# Patient Record
Sex: Male | Born: 1970
Health system: Southern US, Community
[De-identification: ages and names within clinical notes are randomized; demographics above are authoritative.]

## PROBLEM LIST (undated history)

## (undated) DIAGNOSIS — M545 Low back pain, unspecified: Secondary | ICD-10-CM

## (undated) DIAGNOSIS — B019 Varicella without complication: Secondary | ICD-10-CM

## (undated) DIAGNOSIS — Z9289 Personal history of other medical treatment: Secondary | ICD-10-CM

## (undated) DIAGNOSIS — E119 Type 2 diabetes mellitus without complications: Secondary | ICD-10-CM

## (undated) DIAGNOSIS — F32A Depression, unspecified: Secondary | ICD-10-CM

## (undated) DIAGNOSIS — F419 Anxiety disorder, unspecified: Secondary | ICD-10-CM

## (undated) DIAGNOSIS — J45909 Unspecified asthma, uncomplicated: Secondary | ICD-10-CM

## (undated) DIAGNOSIS — J449 Chronic obstructive pulmonary disease, unspecified: Secondary | ICD-10-CM

## (undated) DIAGNOSIS — M1990 Unspecified osteoarthritis, unspecified site: Secondary | ICD-10-CM

## (undated) DIAGNOSIS — F329 Major depressive disorder, single episode, unspecified: Secondary | ICD-10-CM

## (undated) HISTORY — DX: Major depressive disorder, single episode, unspecified: F32.9

## (undated) HISTORY — PX: BACK SURGERY: SHX140

## (undated) HISTORY — DX: Low back pain, unspecified: M54.50

## (undated) HISTORY — DX: Personal history of other medical treatment: Z92.89

## (undated) HISTORY — DX: Depression, unspecified: F32.A

## (undated) HISTORY — DX: Type 2 diabetes mellitus without complications: E11.9

## (undated) HISTORY — DX: Low back pain: M54.5

## (undated) HISTORY — DX: Varicella without complication: B01.9

## (undated) HISTORY — DX: Unspecified osteoarthritis, unspecified site: M19.90

## (undated) HISTORY — DX: Anxiety disorder, unspecified: F41.9

---

## 2000-04-02 HISTORY — PX: APPENDECTOMY: SHX54

## 2006-05-31 ENCOUNTER — Emergency Department: Payer: Self-pay | Admitting: Emergency Medicine

## 2006-05-31 ENCOUNTER — Other Ambulatory Visit: Payer: Self-pay

## 2008-12-13 ENCOUNTER — Inpatient Hospital Stay: Payer: Self-pay | Admitting: Surgery

## 2010-04-02 HISTORY — PX: CHOLECYSTECTOMY: SHX55

## 2011-04-24 IMAGING — US ABDOMEN ULTRASOUND
1 series · 17 of 25 positions shown · non-contrast
Comparison: none

REASON FOR EXAM: LUCIO LARA
COMMENTS:

[Series 1: abdomen ultrasound · 17 of 72 slices shown]
[im 1/72]
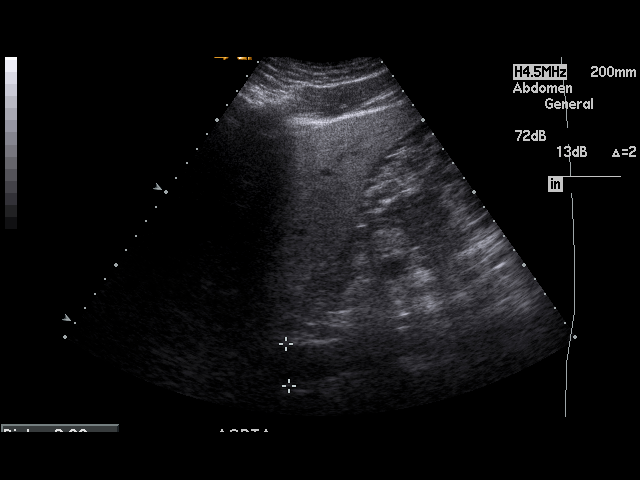
[im 6/72]
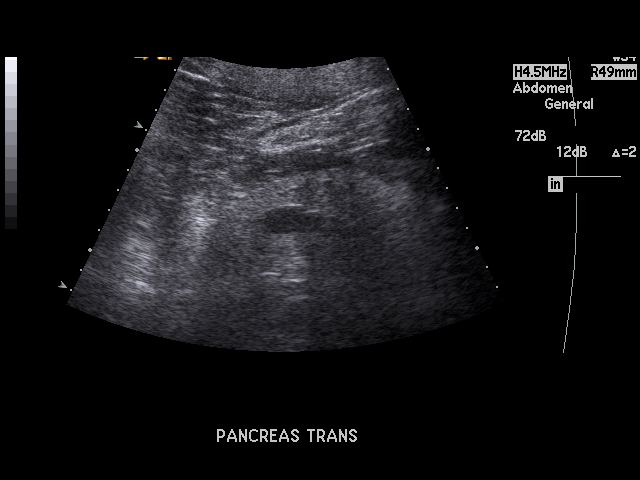
[im 9/72]
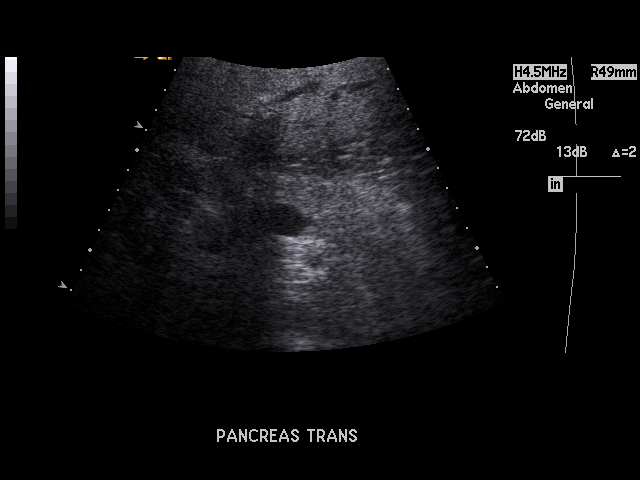
[im 15/72]
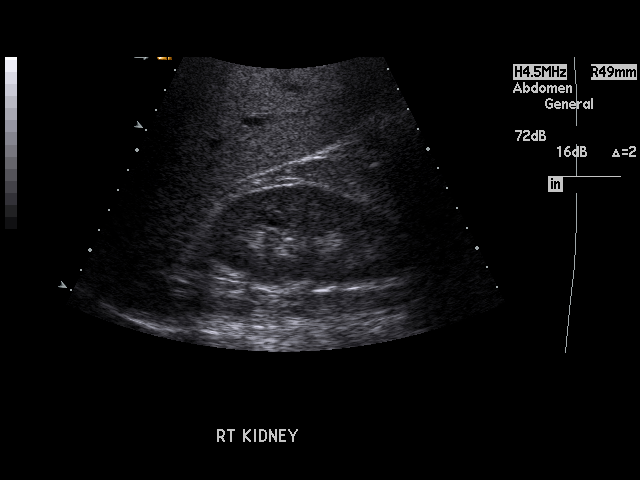
[im 18/72]
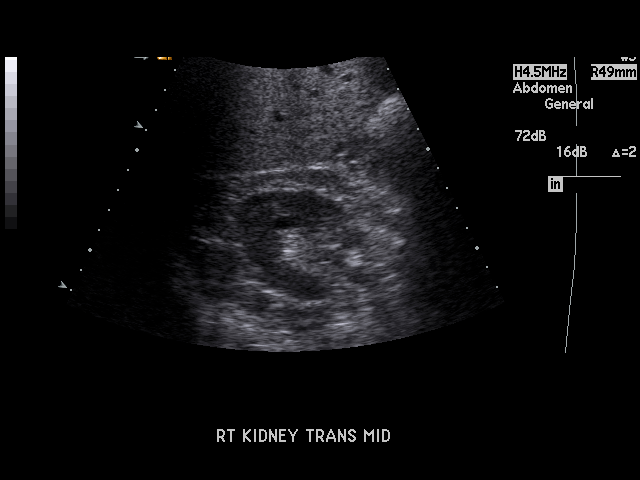
[im 24/72]
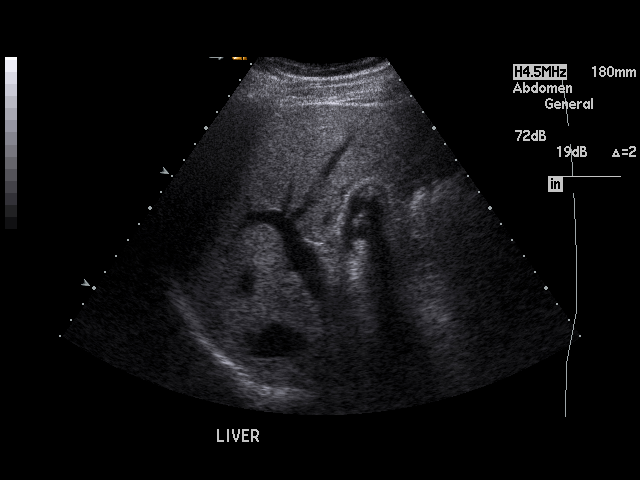
[im 27/72]
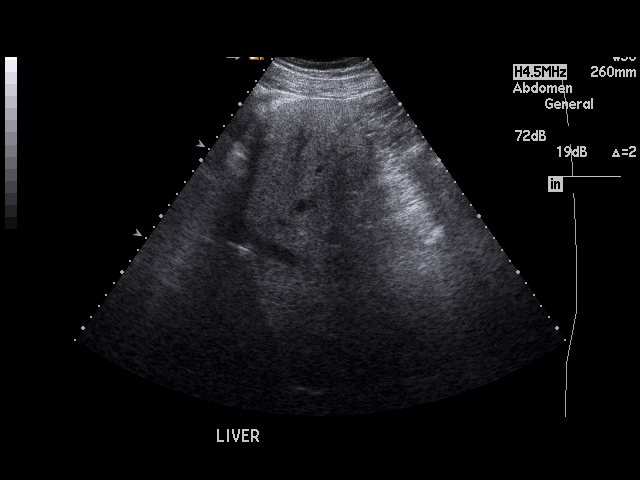
[im 33/72]
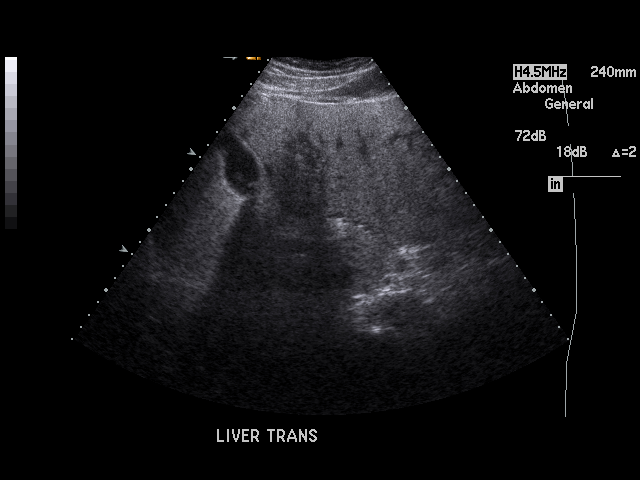
[im 36/72]
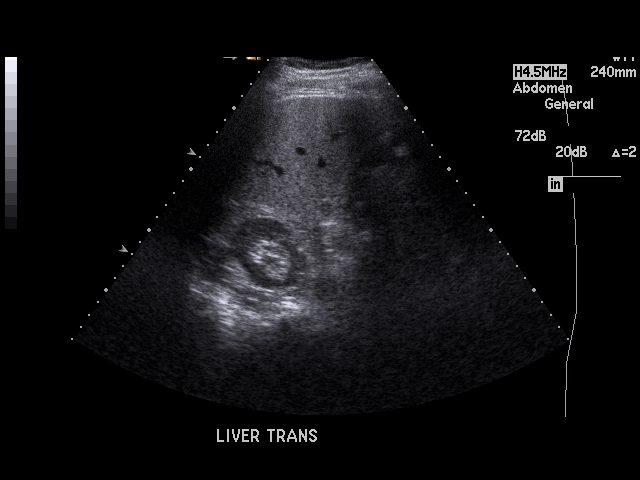
[im 39/72]
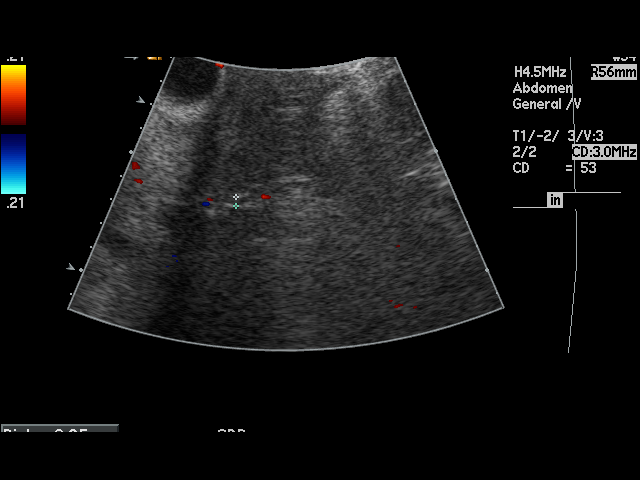
[im 45/72]
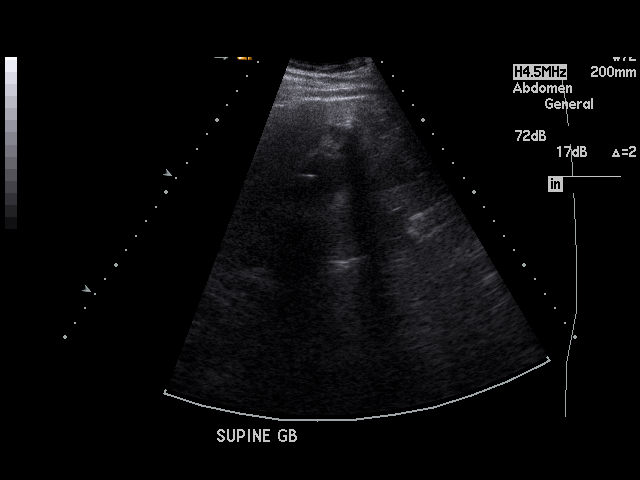
[im 48/72]
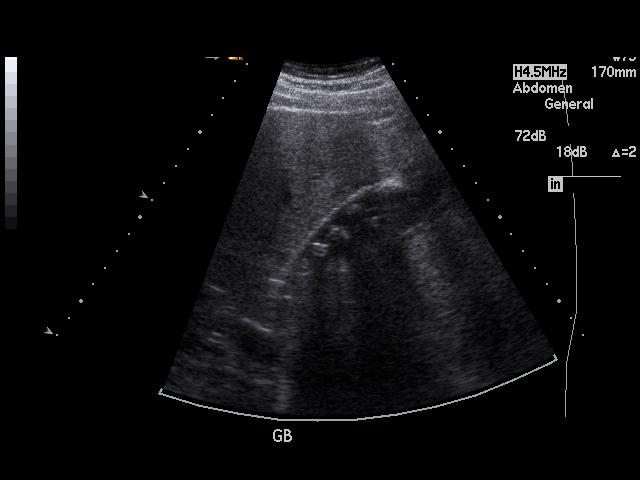
[im 54/72]
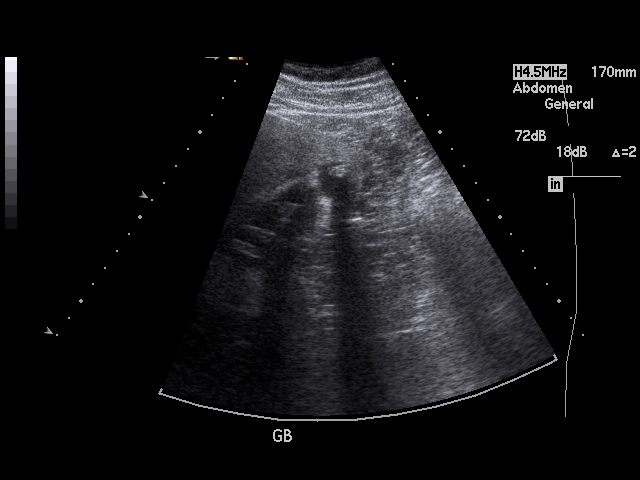
[im 57/72]
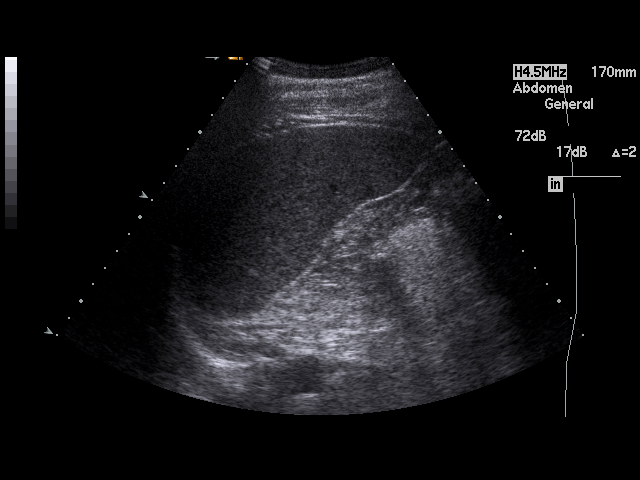
[im 63/72]
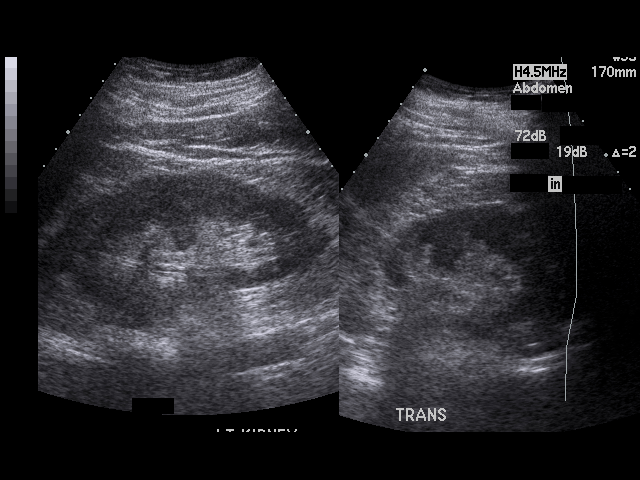
[im 66/72]
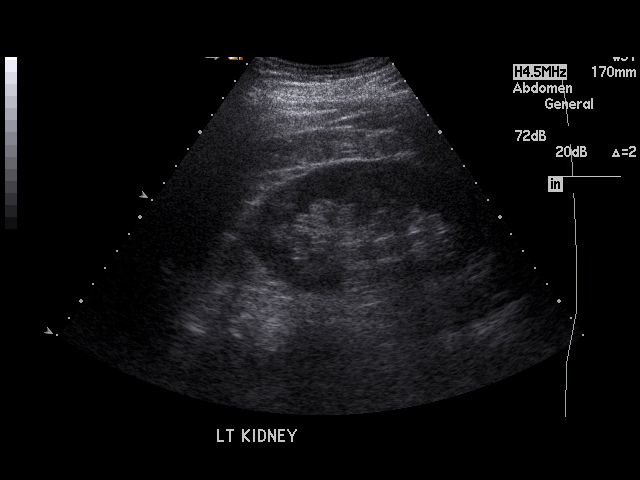
[im 72/72]
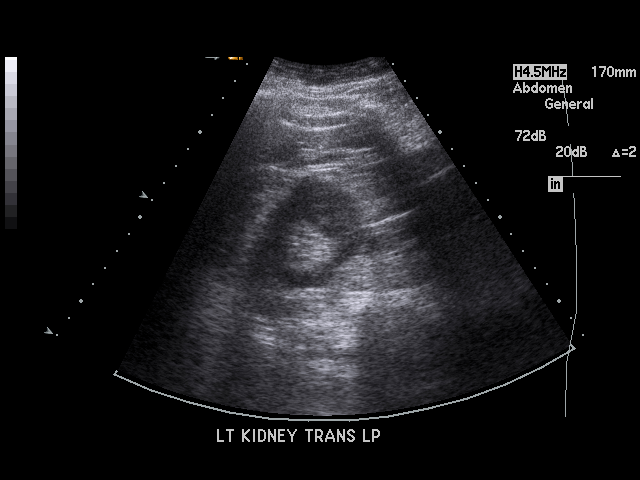

[17 of 25 positions shown; findings below may reference images not displayed]

PROCEDURE:     US  - US ABDOMEN GENERAL SURVEY  - December 14, 2008  [DATE]

RESULT:     The liver exhibits mildly increased echotexture consistent with
fatty infiltration. There is no focal mass nor ductal dilation. The
gallbladder contains multiple echogenic mobile shadowing stones. There is no
sonographic Murphy's sign. The gallbladder wall remains normal at 2.4 mm.
There is no pericholecystic fluid. The common bile duct measures
millimeters in diameter.

The pancreas, abdominal aorta, and kidneys exhibit no acute abnormality.
There is mild enlargement of the spleen at 13.4 cm.
IMPRESSION: 1. There are multiple gallstones present.
2. There are fatty infiltrative changes of the liver.
3. There is mild splenomegaly.
4. The visualized portions of the abdominal aorta are normal in caliber.

## 2013-04-16 DIAGNOSIS — F3289 Other specified depressive episodes: Secondary | ICD-10-CM | POA: Diagnosis not present

## 2013-04-16 DIAGNOSIS — F329 Major depressive disorder, single episode, unspecified: Secondary | ICD-10-CM | POA: Diagnosis not present

## 2015-02-20 ENCOUNTER — Emergency Department
Admission: EM | Admit: 2015-02-20 | Discharge: 2015-02-21 | Disposition: A | Discharge status: Home / Self Care | Payer: Medicare Other | Attending: Emergency Medicine | Admitting: Emergency Medicine

## 2015-02-20 ENCOUNTER — Encounter: Payer: Self-pay | Admitting: Emergency Medicine

## 2015-02-20 DIAGNOSIS — Z87891 Personal history of nicotine dependence: Secondary | ICD-10-CM | POA: Insufficient documentation

## 2015-02-20 DIAGNOSIS — R454 Irritability and anger: Secondary | ICD-10-CM | POA: Insufficient documentation

## 2015-02-20 DIAGNOSIS — E11319 Type 2 diabetes mellitus with unspecified diabetic retinopathy without macular edema: Secondary | ICD-10-CM

## 2015-02-20 DIAGNOSIS — E1165 Type 2 diabetes mellitus with hyperglycemia: Secondary | ICD-10-CM | POA: Insufficient documentation

## 2015-02-20 DIAGNOSIS — R739 Hyperglycemia, unspecified: Secondary | ICD-10-CM

## 2015-02-20 HISTORY — DX: Chronic obstructive pulmonary disease, unspecified: J44.9

## 2015-02-20 HISTORY — DX: Unspecified asthma, uncomplicated: J45.909

## 2015-02-20 LAB — GLUCOSE, CAPILLARY: Glucose-Capillary: 311 mg/dL — ABNORMAL HIGH (ref 65–99)

## 2015-02-20 NOTE — ED Notes (Signed)
Family states 1.5 weeks ago pt started complaining of blurred vision, and was more irritable. Family states pt has been sleeping more. Family states that she took his blood sugar at home and noted it to be 468. Pt has never been diagnosed with diabetes.

## 2015-02-20 NOTE — ED Provider Notes (Signed)
[facility] Emergency Department Provider Note  ____________________________________________  Time seen: Approximately 11:47 PM  I have reviewed the triage vital signs and the nursing notes.   HISTORY  Chief Complaint Hyperglycemia    HPI Brad Jimenez. is a 44 y.o. male who presents to the ED from home with a chief complaint of increased urinary frequency, polydipsia, polyuria, fatigue, irritability and blurry vision. Reports symptoms for 2 weeks. Patient does not have a history of diabetes although it does run in his family. Spouse checked a 15 hour fasting blood sugar yesterday afternoon which was in the 200s. After he ate a meal of [employer] chicken biscuit and [facility], they rechecked his blood sugar which was over 500. Patient does not have a primary care doctor and presents to the ED for further evaluation and treatment of hyperglycemia. Denies recent fever, chills, chest pain, shortness of breath, cough, abdominal pain, nausea, vomiting, diarrhea. Complains of generalized weakness. Spouse states he is more irritable. Nothing makes his symptoms better or worse.  Past Medical History  Diagnosis Date  . COPD (chronic obstructive pulmonary disease) ([place])   . Asthma     There are no active problems to display for this patient.   Past Surgical History  Procedure Laterality Date  . Cholecystectomy    . Back surgery      No current outpatient prescriptions on file.  Allergies Fentanyl  History reviewed. No pertinent family history.  Social History Social History  Substance Use Topics  . Smoking status: Former Smoker    Quit date: 02/20/2011  . Smokeless tobacco: None  . Alcohol Use: Yes     Comment: occasional    Review of Systems Constitutional: Positive for polydipsia. Positive for generalized weakness. No fever/chills Eyes: Positive for blurry vision. ENT: No sore throat. Cardiovascular: Denies chest pain. Respiratory: Denies  shortness of breath. Gastrointestinal: No abdominal pain.  No nausea, no vomiting.  No diarrhea.  No constipation. Genitourinary: Positive for polyuria. Negative for dysuria. Musculoskeletal: Negative for back pain. Skin: Negative for rash. Neurological: Negative for headaches, focal weakness or numbness. Psychiatric:Positive for irritability. Negative for depression, SI/HI/AH/VH. Endocrine:Positive for elevated blood sugars.  10-point ROS otherwise negative.  ____________________________________________   PHYSICAL EXAM:  VITAL SIGNS: ED Triage Vitals  Enc Vitals Group     BP 02/20/15 2337 144/91 mmHg     Pulse Rate 02/20/15 2337 83     Resp 02/20/15 2337 18     Temp 02/20/15 2337 98.7 F (37.1 C)     Temp Source 02/20/15 2337 Oral     SpO2 02/20/15 2337 95 %     Weight 02/20/15 2337 230 lb (104.327 kg)     Height 02/20/15 2337 5\' 7"  (1.702 m)     Head Cir --      Peak Flow --      Pain Score --      Pain Loc --      Pain Edu? --      Excl. in [place]? --     Constitutional: Alert and oriented. Well appearing and in no acute distress. Eyes: Conjunctivae are normal. PERRL. EOMI. Funduscopy grossly within normal limits. Head: Atraumatic. Nose: No congestion/rhinnorhea. Mouth/Throat: Mucous membranes are moist.  Oropharynx non-erythematous. Neck: No stridor.  No carotid bruits. Hematological/Lymphatic/Immunilogical: No cervical lymphadenopathy. Cardiovascular: Normal rate, regular rhythm. Grossly normal heart sounds.  Good peripheral circulation. Respiratory: Normal respiratory effort.  No retractions. Lungs CTAB. Gastrointestinal: Soft and nontender. No distention. No abdominal bruits.  No CVA tenderness. Musculoskeletal: No lower extremity tenderness nor edema.  No joint effusions. Neurologic:  Normal speech and language. No gross focal neurologic deficits are appreciated. No gait instability. Skin:  Skin is warm, dry and intact. No rash noted. Psychiatric: Mood and affect  are normal. Speech and behavior are normal.  ____________________________________________   LABS (all labs ordered are listed, but only abnormal results are displayed)  Labs Reviewed  GLUCOSE, CAPILLARY - Abnormal; Notable for the following:    Glucose-Capillary 311 (*)    All other components within normal limits  CBC WITH DIFFERENTIAL/PLATELET  BASIC METABOLIC PANEL  URINALYSIS COMPLETEWITH MICROSCOPIC ([facility] ONLY)  HEMOGLOBIN A1C   ____________________________________________  EKG  None ____________________________________________  RADIOLOGY  None ____________________________________________   PROCEDURES  Procedure(s) performed: None  Critical Care performed: No  ____________________________________________   INITIAL IMPRESSION / ASSESSMENT AND PLAN / ED COURSE  Pertinent labs & imaging results that were available during my care of the patient were reviewed by me and considered in my medical decision making (see chart for details).  44 year old male who presents with symptoms consistent with new diagnosis of adult onset type 2 diabetes. Nonfasting Accu-Chek is 311. Will check screening labs including electrolytes, hemoglobin A1c; initiate IV fluid resuscitation.  ----------------------------------------- 12:40 AM on 02/21/2015 -----------------------------------------  Updated patient and spouse of laboratory and return results. Patient is not in DKA. Will initiate treatment with metformin, refer for outpatient ophthalmology follow-up as well as primary care establishment. Strict return precautions given. Both verbalize understanding and agree with plan of care. 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

## 2015-02-20 NOTE — ED Notes (Signed)
Pt presents to ED with c/o increased urinary frequency, fatigue, increased thirst, and blurry vision x 1.5 weeks.  Family states that she took his blood sugar at home and it was 468. Pt states he has not been diagnosed with diabetes. Pt denies chest pain, abdominal pain, or shortness of breath. Pt alert and oriented x 4, no increased work in breathing, skin warm and dry. Call bell within reach. Family at bedside. MD at bedside.

## 2015-02-21 LAB — CBC WITH DIFFERENTIAL/PLATELET
BASOS PCT: 1 %
Basophils Absolute: 0.1 10*3/uL (ref 0–0.1)
EOS ABS: 0.1 10*3/uL (ref 0–0.7)
Eosinophils Relative: 2 %
HCT: 43 % (ref 40.0–52.0)
HEMOGLOBIN: 15.3 g/dL (ref 13.0–18.0)
LYMPHS ABS: 3.7 10*3/uL — AB (ref 1.0–3.6)
Lymphocytes Relative: 39 %
MCH: 30.7 pg (ref 26.0–34.0)
MCHC: 35.5 g/dL (ref 32.0–36.0)
MCV: 86.5 fL (ref 80.0–100.0)
Monocytes Absolute: 0.5 10*3/uL (ref 0.2–1.0)
Monocytes Relative: 6 %
NEUTROS PCT: 52 %
Neutro Abs: 5 10*3/uL (ref 1.4–6.5)
Platelets: 289 10*3/uL (ref 150–440)
RBC: 4.97 MIL/uL (ref 4.40–5.90)
RDW: 13 % (ref 11.5–14.5)
WBC: 9.4 10*3/uL (ref 3.8–10.6)

## 2015-02-21 LAB — URINALYSIS COMPLETE WITH MICROSCOPIC (ARMC ONLY)
BILIRUBIN URINE: NEGATIVE
Bacteria, UA: NONE SEEN
LEUKOCYTES UA: NEGATIVE
NITRITE: NEGATIVE
PH: 6 (ref 5.0–8.0)
Protein, ur: NEGATIVE mg/dL
RBC / HPF: NONE SEEN RBC/hpf (ref 0–5)
Specific Gravity, Urine: 1.025 (ref 1.005–1.030)
Squamous Epithelial / LPF: NONE SEEN

## 2015-02-21 LAB — BASIC METABOLIC PANEL
Anion gap: 11 (ref 5–15)
BUN: 15 mg/dL (ref 6–20)
CHLORIDE: 94 mmol/L — AB (ref 101–111)
CO2: 24 mmol/L (ref 22–32)
Calcium: 9.1 mg/dL (ref 8.9–10.3)
Creatinine, Ser: 1.12 mg/dL (ref 0.61–1.24)
GFR calc non Af Amer: >60 mL/min (ref >60)
Glucose, Bld: 313 mg/dL — ABNORMAL HIGH (ref 65–99)
POTASSIUM: 3.4 mmol/L — AB (ref 3.5–5.1)
SODIUM: 129 mmol/L — AB (ref 135–145)

## 2015-02-21 LAB — HEMOGLOBIN A1C: Hgb A1c MFr Bld: 10 % — ABNORMAL HIGH (ref 4.0–6.0)

## 2015-02-21 MED ORDER — METFORMIN HCL 500 MG PO TABS: 500.0000 mg | ORAL_TABLET | Freq: Two times a day (BID) | ORAL | Status: DC

## 2015-02-21 MED ORDER — SODIUM CHLORIDE 0.9 % IV BOLUS (SEPSIS)
1000.0000 mL | Freq: Once | INTRAVENOUS | Status: AC
  Administered 2015-02-21: 1000 mL via INTRAVENOUS

## 2015-02-21 NOTE — ED Notes (Signed)

## 2015-02-21 NOTE — Discharge Instructions (Signed)
1. Start metformin 500 mg twice daily (#60). 2. Please call the numbers provided to establish primary care follow-up as well as ophthalmology follow-up. 3. Return to the ER for worsening symptoms, persistent vomiting, difficulty breathing or other concerns.  Hyperglycemia High blood sugar (hyperglycemia) means that the level of sugar in your blood is higher than it should be. Signs of high blood sugar include:  Feeling thirsty.  Frequent peeing (urinating).  Feeling tired or sleepy.  Dry mouth.  Vision changes.  Feeling weak.  Feeling hungry but losing weight.  Numbness and tingling in your hands or feet.  Headache. When you ignore these signs, your blood sugar may keep going up. These problems may get worse, and other problems may begin. HOME CARE  Check your blood sugars as told by your doctor. Write down the numbers with the date and time.  Take the right amount of insulin or diabetes pills at the right time. Write down the dose with date and time.  Refill your insulin or diabetes pills before running out.  Watch what you eat. Follow your meal plan.  Drink liquids without sugar, such as water. Check with your doctor if you have kidney or heart disease.  Follow your doctor's orders for exercise. Exercise at the same time of day.  Keep your doctor's appointments. GET HELP RIGHT AWAY IF:   You have trouble thinking or are confused.  You have fast breathing with fruity smelling breath.  You pass out (faint).  You have 2 to 3 days of high blood sugars and you do not know why.  You have chest pain.  You are feeling sick to your stomach (nauseous) or throwing up (vomiting).  You have sudden vision changes. MAKE SURE YOU:   Understand these instructions.  Will watch your condition.  Will get help right away if you are not doing well or get worse.   This information is not intended to replace advice given to you by your health care provider. Make sure you  discuss any questions you have with your health care provider.   Document Released: 01/14/2009 Document Revised: 04/09/2014 Document Reviewed: 11/23/2014 Elsevier Interactive Patient Education 2016 Elsevier Inc[employer]  Type 2 Diabetes Mellitus, Adult Type 2 diabetes mellitus is a long-term (chronic) disease. In type 2 diabetes:  The pancreas does not make enough of a hormone called insulin.  The cells in the body do not respond as well to the insulin that is made.  Both of the above can happen. Normally, insulin moves sugars from food into tissue cells. This gives you energy. If you have type 2 diabetes, sugars cannot be moved into tissue cells. This causes high blood sugar (hyperglycemia).  Your doctors will set personal treatment goals for you based on your age, your medicines, how long you have had diabetes, and any other medical conditions you have. Generally, the goal of treatment is to maintain the following blood glucose levels:  Before meals (preprandial): 80-130 mg/dL.  After meals (postprandial): below 180 mg/dL.  A1c: less than 6.5-7%. HOME CARE  Have your hemoglobin A1c level checked twice a year. The level shows if your diabetes is under control or out of control.  Test your blood sugar level every day as told by your doctor.  Check your ketone levels by testing your pee (urine) when you are sick and as told.  Take your diabetes or insulin medicine as told by your doctor.  Never run out of insulin.  Adjust how much insulin you give  yourself based on how many carbs (carbohydrates) you eat. Carbs are in many foods, such as fruits, vegetables, whole grains, and dairy products.  Have a healthy snack between every healthy meal. Have 3 meals and 3 snacks a day.  Lose weight if you are overweight.  Carry a medical alert card or wear your medical alert jewelry.  Carry a 15-gram carb snack with you at all times. Examples include:  Glucose pills, 3 or 4.  Glucose gel,  15-gram tube.  Raisins, 2 tablespoons (24 grams).  Jelly beans, 6.  Animal crackers, 8.  Regular (not diet) pop, 4 ounces (120 milliliters).  Gummy treats, 9.  Notice low blood sugar (hypoglycemia) symptoms, such as:  Shaking (tremors).  Trouble thinking clearly.  Sweating.  Faster heart rate.  Headache.  Dry mouth.  Hunger.  Crabbiness (irritability).  Being worried or tense (anxious).  Restless sleep.  A change in speech or coordination.  Confusion.  Treat low blood sugar right away. If you are alert and can swallow, follow the 15:15 rule:  Take 15-20 grams of a rapid-acting glucose or carb. This includes glucose gel, glucose pills, or 4 ounces (120 milliliters) of fruit juice, regular pop, or low-fat milk.  Check your blood sugar level 15 minutes after taking the glucose.  Take 15-20 grams more of glucose if the repeat blood sugar level is still 70 mg/dL (milligrams/deciliter) or below.  Eat a meal or snack within 1 hour of the blood sugar levels going back to normal.  Notice early symptoms of high blood sugar, such as:  Being really thirsty or drinking a lot (polydipsia).  Peeing a lot (polyuria).  Do at least 150 minutes of physical activity a week or as told.  Split the 150 minutes of activity up during the week. Do not do 150 minutes of activity in one day.  Perform exercises, such as weight lifting, at least 2 times a week or as told.  Spend no more than 90 minutes at one time inactive.  Adjust your insulin or food intake as needed if you start a new exercise or sport.  Follow your sick-day plan when you are not able to eat or drink as usual.  Do not smoke, chew tobacco, or use electronic cigarettes.  Women who are not pregnant should drink no more than 1 drink a day. Men should drink no more than 2 drinks a day.  Only drink alcohol with food.  Ask your doctor if alcohol is safe for you.  Tell your doctor if you drink alcohol several  times during the week.  See your doctor regularly.  Schedule an eye exam soon after you are told you have diabetes. Schedule exams once every year.  Check your skin and feet every day. Check for cuts, bruises, redness, nail problems, bleeding, blisters, or sores. A doctor should do a foot exam once a year.  Brush your teeth and gums twice a day. Floss once a day. Visit your dentist regularly.  Share your diabetes plan with your workplace or school.  Keep your shots that fight diseases (vaccines) up to date.  Get a flu (influenza) shot every year.  Get a pneumonia shot. If you are 14 years of age or older and you have never gotten a pneumonia shot, you might need to get two shots.  Ask your doctor which other shots you should get.  Learn how to deal with stress.  Get diabetes education and support as needed.  Ask your doctor for special  help if:  You need help to maintain or improve how you do things on your own.  You need help to maintain or improve the quality of your life.  You have foot or hand problems.  You have trouble cleaning yourself, dressing, eating, or doing physical activity. GET HELP IF:  You are unable to eat or drink for more than 6 hours.  You feel sick to your stomach (nauseous) or throw up (vomit) for more than 6 hours.  Your blood sugar level is over 240 mg/dL.  There is a change in mental status.  You get another serious illness.  You have watery poop (diarrhea) for more than 6 hours.  You have been sick or have had a fever for 2 or more days and are not getting better.  You have pain when you are active. GET HELP RIGHT AWAY IF:  You have trouble breathing.  Your ketone levels are higher than your doctor says they should be. MAKE SURE YOU:  Understand these instructions.  Will watch your condition.  Will get help right away if you are not doing well or get worse.   This information is not intended to replace advice given to you by  your health care provider. Make sure you discuss any questions you have with your health care provider.   Document Released: 12/27/2007 Document Revised: 08/03/2014 Document Reviewed: 10/19/2011 Elsevier Interactive Patient Education [employer].

## 2015-03-03 DIAGNOSIS — E119 Type 2 diabetes mellitus without complications: Secondary | ICD-10-CM | POA: Diagnosis not present

## 2015-03-21 ENCOUNTER — Ambulatory Visit (INDEPENDENT_AMBULATORY_CARE_PROVIDER_SITE_OTHER): Payer: Medicare Other | Admitting: Family Medicine

## 2015-03-21 VITALS — BP 118/74 | HR 78 | Temp 98.0°F | Ht 68.0 in | Wt 226.4 lb

## 2015-03-21 DIAGNOSIS — IMO0001 Reserved for inherently not codable concepts without codable children: Secondary | ICD-10-CM

## 2015-03-21 DIAGNOSIS — F418 Other specified anxiety disorders: Secondary | ICD-10-CM | POA: Diagnosis not present

## 2015-03-21 DIAGNOSIS — E119 Type 2 diabetes mellitus without complications: Secondary | ICD-10-CM | POA: Diagnosis not present

## 2015-03-21 DIAGNOSIS — E1165 Type 2 diabetes mellitus with hyperglycemia: Secondary | ICD-10-CM

## 2015-03-21 DIAGNOSIS — F419 Anxiety disorder, unspecified: Secondary | ICD-10-CM

## 2015-03-21 DIAGNOSIS — G8929 Other chronic pain: Secondary | ICD-10-CM

## 2015-03-21 DIAGNOSIS — M545 Low back pain, unspecified: Secondary | ICD-10-CM

## 2015-03-21 DIAGNOSIS — F32A Depression, unspecified: Secondary | ICD-10-CM

## 2015-03-21 DIAGNOSIS — F329 Major depressive disorder, single episode, unspecified: Secondary | ICD-10-CM

## 2015-03-21 LAB — MICROALBUMIN / CREATININE URINE RATIO
Creatinine,U: 308.4 mg/dL
MICROALB UR: 2.7 mg/dL — AB (ref 0.0–1.9)
Microalb Creat Ratio: 0.9 mg/g (ref 0.0–30.0)

## 2015-03-21 MED ORDER — GLUCOSE BLOOD VI STRP: ORAL_STRIP | Status: DC

## 2015-03-21 MED ORDER — RELION LANCETS STANDARD 21G MISC: 1.0000 | Freq: Two times a day (BID) | Status: DC

## 2015-03-21 MED ORDER — RELION CONFIRM GLUCOSE MONITOR W/DEVICE KIT: 1.0000 | PACK | Freq: Two times a day (BID) | Status: DC

## 2015-03-21 NOTE — Progress Notes (Signed)
Pre visit review using our clinic review tool, if applicable. No additional management support is needed unless otherwise documented below in the visit note. 

## 2015-03-21 NOTE — Patient Instructions (Signed)
Nice to meet you. We will send her to diabetic education. If you don't hear anything regarding this next week please let us know. Please check your blood sugar fasting in the morning and before dinner. If it starts to increase greater than 150 please let us know. Please monitor your vision if this does not improve please let us know. Develop numbness, weakness, bowel or bladder incontinence, numbness between her legs, thoughts of harming herself or others, or any irritated in symptoms please seek medical attention.

## 2015-03-22 ENCOUNTER — Encounter: Payer: Self-pay | Admitting: Family Medicine

## 2015-03-22 DIAGNOSIS — G8929 Other chronic pain: Secondary | ICD-10-CM | POA: Insufficient documentation

## 2015-03-22 DIAGNOSIS — F419 Anxiety disorder, unspecified: Secondary | ICD-10-CM | POA: Insufficient documentation

## 2015-03-22 DIAGNOSIS — M545 Low back pain, unspecified: Secondary | ICD-10-CM | POA: Insufficient documentation

## 2015-03-22 DIAGNOSIS — F329 Major depressive disorder, single episode, unspecified: Secondary | ICD-10-CM | POA: Insufficient documentation

## 2015-03-22 DIAGNOSIS — F32A Depression, unspecified: Secondary | ICD-10-CM | POA: Insufficient documentation

## 2015-03-22 NOTE — Assessment & Plan Note (Signed)
Followed by psychiatry for this. Currently on Zoloft. This issue is stable. No SI or HI. He will continue to follow with psychiatry.

## 2015-03-22 NOTE — Assessment & Plan Note (Signed)
Patient chronic low back pain status post multiple surgeries on his back. His discomfort is stable from this. He does report some numbness intermittently in the back of his legs that is stable as well. His lower extremities are neurologically intact. No red flags. He will continue to monitor this. He is given return precautions.

## 2015-03-22 NOTE — Assessment & Plan Note (Signed)
Patient presents in follow-up from his recent ED visit where he was diagnosed diabetes. A1c that time was 10. He was started on metformin, though he reports some vision issues with this that have resolved. He is now currently on no medications for the last week. Per his glucose log he has sugars range from 89-147 and this would represent remarkable control given he is not on medicine and he had an A1c of 10. He has significantly changed his diet and this could have had some improvement in blood sugars. We will send in a new meter for him. He will return for fasting lipid panel and fasting glucose to determine how true these glucose readings are from home. Given these blood sugar readings I'm hesitant to put him on a sulfonylurea at this time as this could potentially drop his glucose to hypoglycemic levels. He was advised to continue to monitor his blood sugar. If it starts to rise up above 150-200 he'll let us know. He was reminded of symptoms to give [place] a call about.

## 2015-03-22 NOTE — Progress Notes (Signed)
Patient ID: Brad Riches., male   DOB: 19-Mar-1971, 44 y.o.   MRN: [id number]  Brad Rumps, MD Phone: [phone]  Brad Selner. is a 44 y.o. male who presents today for new patient visit.  DIABETES Disease Monitoring: Blood Sugar ranges-in the past week have ranged from 89-147 Polyuria/phagia/dipsia- no      Visual problems- does note some issues seeing up close, notes this is much improved as his blood sugars have decreased, has seen ophthalmology and states he was advised there were no diabetic effects Medications: Compliance- discontinued metformin on his own Hypoglycemic symptoms- no Patient presents today for first patient visit. Notes he was seen in the emergency room several weeks ago when he was noted to have excessive thirst, polyuria, and blurry vision. Prior to going to the ED he had checked his blood sugar on several occasions with his mother's old meter and noted it had ranged from 300-500. He had an A1c in the ED that was 10. He was not in DKA. He was started on metformin at that time. He notes after starting on the metformin his vision started to get worse. His blood sugar did come down. He notes he got to the point where he couldn't see up close or far away. He stopped metformin and his distance vision improved. It is now back to baseline. He notes he has significantly changed his diet. He stopped drinking sodas and candy. He was drinking 12 cans of regular [employer] a day. Has cut out most bread. He feels significantly improved with these dietary changes. He has not noticed any difference since coming off the metformin.  Chronic back issues: Patient notes he had back surgery in 2001 when he broke a bone in his spine. Notes he had to have resurgery when the hardware became loose. Notes since that time he is intermittently had numbness in the back of his legs that occurs depending on what position he is sitting in. He denies weakness, saddle anesthesia, bowel and bladder  incontinence, fever, and history of cancer. He notes this issue is stable and unchanged over a number of years.  Anxiety/depression: Patient notes he is followed by psychiatrist since 2003. He sees a psychiatrist every 3 months. He feels these issues are very well-controlled on his current dose of Zoloft. He denies SI and HI. He denies significant symptoms of anxiety or depression at this time.   Active Ambulatory Problems    Diagnosis Date Noted  . Diabetes type 2, uncontrolled ([place]) 03/21/2015  . Chronic low back pain 03/22/2015  . Anxiety and depression 03/22/2015   Resolved Ambulatory Problems    Diagnosis Date Noted  . No Resolved Ambulatory Problems   Past Medical History  Diagnosis Date  . COPD (chronic obstructive pulmonary disease) ([place])   . Asthma   . Arthritis   . Low back pain   . Chickenpox   . Diabetes ([place])   . Depression   . Anxiety   . History of blood transfusion     Family History  Problem Relation Age of Onset  . Alcoholism Father   . Arthritis Mother   . Lung cancer Mother   . Heart disease Mother   . Stroke Father   . Hypertension Mother   . Diabetes Mother     Social History   Social History  . Marital Status: Married    Spouse Name: N/A  . Number of Children: N/A  . Years of Education: N/A   Occupational  History  . Not on file.   Social History Main Topics  . Smoking status: Former Smoker    Quit date: 02/20/2011  . Smokeless tobacco: Not on file  . Alcohol Use: 0.0 oz/week    0 Standard drinks or equivalent per week     Comment: occasional  . Drug Use: No  . Sexual Activity: Not on file   Other Topics Concern  . Not on file   Social History Narrative    ROS   General:  Negative for unexplained weight loss, fever Skin: Negative for new or changing mole, sore that won't heal HEENT: Positive for trouble seeing Negative for trouble hearin, ringing in ears, mouth sores, hoarseness, change in voice, dysphagia. CV:  Negative for  chest pain, dyspnea, edema, palpitations Resp: Negative for cough, dyspnea, hemoptysis GI: Negative for nausea, vomiting, diarrhea, constipation, abdominal pain, melena, hematochezia. GU: Negative for dysuria, incontinence, urinary hesitance, hematuria, vaginal or penile discharge, polyuria, sexual difficulty, lumps in testicle or breasts MSK: Positive for muscle cramps and aches, Negative for joint pain or swelling Neuro: Positive for numbness, Negative for headaches, weakness, dizziness, passing out/fainting Psych: Positive for anxiety, Negative for depression, memory problems Endocrine: Positive for excessive thirst, negative for frequent urination  Objective  Physical Exam Filed Vitals:   03/21/15 1353  BP: 118/74  Pulse: 78  Temp: 98 F (36.7 C)   Physical Exam  Constitutional: He is well-developed, well-nourished, and in no distress.  HENT:  Head: Normocephalic and atraumatic.  Right Ear: External ear normal.  Left Ear: External ear normal.  Mouth/Throat: Oropharynx is clear and moist. No oropharyngeal exudate.  Eyes: Conjunctivae are normal. Pupils are equal, round, and reactive to light.  Neck: Neck supple.  Cardiovascular: Normal rate, regular rhythm and normal heart sounds.  Exam reveals no gallop and no friction rub.   No murmur heard. Pulmonary/Chest: Effort normal and breath sounds normal. No respiratory distress. He has no wheezes. He has no rales.  Abdominal: Soft. Bowel sounds are normal. He exhibits no distension. There is no tenderness. There is no rebound and no guarding.  Musculoskeletal:  Midline scar noted in lumbar spine, no midline tenderness, no muscular tenderness, no swelling, no erythema  Lymphadenopathy:    He has no cervical adenopathy.  Neurological: He is alert. Gait normal.  5 out of 5 strength bilateral quads, hamstrings, plantar flexion, and dorsiflexion, sensation to light touch intact in bilateral lower extremities, 2+ patellar reflexes    Skin: Skin is warm and dry. He is not diaphoretic.  Psychiatric: Mood and affect normal.   normal monofilament test, no ulcerations or skin changes on the feet   Assessment/Plan:   Diabetes type 2, uncontrolled ([place]) Patient presents in follow-up from his recent ED visit where he was diagnosed diabetes. A1c that time was 10. He was started on metformin, though he reports some vision issues with this that have resolved. He is now currently on no medications for the last week. Per his glucose log he has sugars range from 89-147 and this would represent remarkable control given he is not on medicine and he had an A1c of 10. He has significantly changed his diet and this could have had some improvement in blood sugars. We will send in a new meter for him. He will return for fasting lipid panel and fasting glucose to determine how true these glucose readings are from home. Given these blood sugar readings I'm hesitant to put him on a sulfonylurea at this time as  this could potentially drop his glucose to hypoglycemic levels. He was advised to continue to monitor his blood sugar. If it starts to rise up above 150-200 he'll let us know. He was reminded of symptoms to give [place] a call about.  Chronic low back pain Patient chronic low back pain status post multiple surgeries on his back. His discomfort is stable from this. He does report some numbness intermittently in the back of his legs that is stable as well. His lower extremities are neurologically intact. No red flags. He will continue to monitor this. He is given return precautions.  Anxiety and depression Followed by psychiatry for this. Currently on Zoloft. This issue is stable. No SI or HI. He will continue to follow with psychiatry.    Orders Placed This Encounter  Procedures  . Basic Metabolic Panel (BMET)    Standing Status: Future     Number of Occurrences:      Standing Expiration Date: 03/20/2016  . Urine Microalbumin w/creat. ratio   . Lipid Profile    Standing Status: Future     Number of Occurrences:      Standing Expiration Date: 03/20/2016  . Ambulatory referral to diabetic education    Referral Priority:  Routine    Referral Type:  Consultation    Referral Reason:  Specialty Services Required    Number of Visits Requested:  1    Meds ordered this encounter  Medications  . RELION LANCETS STANDARD 21G MISC    Sig: 1 Device by Does not apply route 2 (two) times daily.    Dispense:  200 each    Refill:  2  . Blood Glucose Monitoring Suppl (RELION CONFIRM GLUCOSE MONITOR) W/DEVICE KIT    Sig: 1 Device by Does not apply route 2 (two) times daily.    Dispense:  1 kit    Refill:  0  . glucose blood (RELION CONFIRM/MICRO TEST) test strip    Sig: Use as instructed    Dispense:  100 each    Refill:  12    Dragon voice recognition software was used during the dictation process of this note. If any phrases or words seem inappropriate it is likely secondary to the translation process being inefficient.  Brad Rumps

## 2015-03-23 ENCOUNTER — Encounter: Payer: Self-pay | Admitting: Family Medicine

## 2015-04-07 ENCOUNTER — Encounter: Payer: Self-pay | Admitting: *Deleted

## 2015-04-07 ENCOUNTER — Encounter: Payer: Medicare Other | Attending: Family Medicine | Admitting: *Deleted

## 2015-04-07 VITALS — BP 118/72 | Ht 68.0 in | Wt 223.5 lb

## 2015-04-07 DIAGNOSIS — E119 Type 2 diabetes mellitus without complications: Secondary | ICD-10-CM | POA: Insufficient documentation

## 2015-04-07 NOTE — Progress Notes (Signed)
Diabetes Self-Management Education  Visit Type: First/Initial  Appt. Start Time: 1320 Appt. End Time: 1430  04/07/2015  [name], identified by name and date of birth, is a 45 y.o. male with a diagnosis of Diabetes: Type 2.   ASSESSMENT  Blood pressure 118/72, height 5\' 8"  (1.727 m), weight 223 lb 8 oz (101.379 kg). Body mass index is 33.99 kg/(m^2).      Diabetes Self-Management Education - 04/07/15 1505    Visit Information   Visit Type First/Initial   Initial Visit   Diabetes Type Type 2   Are you currently following a meal plan? Yes   What type of meal plan do you follow? no surgary sodas, portion control, reading food labels   Are you taking your medications as prescribed? Yes   Date Diagnosed 02/20/15   Health Coping   How would you rate your overall health? Good   Psychosocial Assessment   Patient Belief/Attitude about Diabetes Motivated to manage diabetes   Self-care barriers None   Self-management support Doctor's office;Family   Other persons present Spouse/SO   Patient Concerns Nutrition/Meal planning;Problem Solving   Special Needs None   Preferred Learning Style Hands on   Learning Readiness Change in progress   How often do you need to have someone help you when you read instructions, pamphlets, or other written materials from your doctor or pharmacy? 1 - Never   What is the last grade level you completed in school? [id number]   Complications   Last HgB A1C per patient/outside source 10 %  02/20/15   How often do you check your blood sugar? 1-2 times/day   Fasting Blood glucose range (mg/dL) 70-129  Pt reports FBG's 110-120's mg/dL.   Have you had a dilated eye exam in the past 12 months? Yes   Have you had a dental exam in the past 12 months? Yes   Are you checking your feet? Yes   How many days per week are you checking your feet? 4   Dietary Intake   Breakfast sausage and 1 piece of bread or sausage biscuit or peanut butter crackers   Lunch grilled  chicken with potatoes and beans or hotdog or hamburger with fries   Snack (afternoon) small pizza   Dinner chicken with baked potatoes and pintos; spaghetti, fish with tator tots   Beverage(s) water, unsweetened tea   Exercise   Exercise Type ADL's   Patient Education   Previous Diabetes Education No   Disease state  Definition of diabetes, type 1 and 2, and the diagnosis of diabetes;Factors that contribute to the development of diabetes   Nutrition management  Role of diet in the treatment of diabetes and the relationship between the three main macronutrients and blood glucose level;Carbohydrate counting;Food label reading, portion sizes and measuring food.   Physical activity and exercise  Role of exercise on diabetes management, blood pressure control and cardiac health.   Monitoring Purpose and frequency of SMBG.;Identified appropriate SMBG and/or A1C goals.   Chronic complications Relationship between chronic complications and blood glucose control   Psychosocial adjustment Identified and addressed patients feelings and concerns about diabetes   Individualized Goals (developed by patient)   Reducing Risk Improve blood sugars Prevent diabetes complications   Outcomes   Expected Outcomes Demonstrated interest in learning. Expect positive outcomes      Individualized Plan for Diabetes Self-Management Training:   Learning Objective:  Patient will have a greater understanding of diabetes self-management. Patient education plan is to attend  individual and/or group sessions per assessed needs and concerns.   Plan:   Patient Instructions  Check blood sugars 2 x day before breakfast and 2 hrs after supper every day Exercise: Begin walking for    15  minutes   3  days a week and gradually increase to 150 minutes/week Eat 3 meals day,  1-2  snacks a day Space meals 4-6 hours apart Don't skip meals Bring blood sugar records to the next class   Expected Outcomes:  Demonstrated interest  in learning. Expect positive outcomes  Education material provided:  General Meal Planning Guidelines Simple Meal Plan  If problems or questions, patient to contact team via:   Johny Drilling, [place], [place], CDE [phone]  Future DSME appointment:  May 02, 2015 for Class 1

## 2015-04-07 NOTE — Patient Instructions (Addendum)
Check blood sugars 2 x day before breakfast and 2 hrs after supper every day Exercise: Begin walking for    15  minutes   3  days a week and gradually increase to 150 minutes/week Eat 3 meals day,  1-2  snacks a day Space meals 4-6 hours apart Don't skip meals Bring blood sugar records to the next class

## 2015-04-11 ENCOUNTER — Ambulatory Visit: Payer: Medicare Other | Admitting: Family Medicine

## 2015-04-13 ENCOUNTER — Encounter: Payer: Self-pay | Admitting: Family Medicine

## 2015-04-13 ENCOUNTER — Ambulatory Visit (INDEPENDENT_AMBULATORY_CARE_PROVIDER_SITE_OTHER): Payer: Medicare Other | Admitting: Family Medicine

## 2015-04-13 VITALS — BP 116/72 | HR 78 | Temp 97.4°F | Resp 14 | Ht 68.0 in | Wt 222.2 lb

## 2015-04-13 DIAGNOSIS — IMO0001 Reserved for inherently not codable concepts without codable children: Secondary | ICD-10-CM

## 2015-04-13 DIAGNOSIS — R079 Chest pain, unspecified: Secondary | ICD-10-CM

## 2015-04-13 DIAGNOSIS — F419 Anxiety disorder, unspecified: Secondary | ICD-10-CM

## 2015-04-13 DIAGNOSIS — M546 Pain in thoracic spine: Secondary | ICD-10-CM | POA: Diagnosis not present

## 2015-04-13 DIAGNOSIS — E1165 Type 2 diabetes mellitus with hyperglycemia: Secondary | ICD-10-CM

## 2015-04-13 DIAGNOSIS — F418 Other specified anxiety disorders: Secondary | ICD-10-CM

## 2015-04-13 DIAGNOSIS — F329 Major depressive disorder, single episode, unspecified: Secondary | ICD-10-CM

## 2015-04-13 DIAGNOSIS — F32A Depression, unspecified: Secondary | ICD-10-CM

## 2015-04-13 MED ORDER — SERTRALINE HCL 50 MG PO TABS: 50.0000 mg | ORAL_TABLET | Freq: Every day | ORAL | Status: DC

## 2015-04-13 NOTE — Patient Instructions (Signed)
Nice to see you. Please continue your diet changes for your diabetes. Please continue to follow the diabetic educator. Please monitor for recurrence of your back pain. If it recurs or he develop chest pain, shortness of breath, sweating, numbness, weakness, fever, or any new or change in symptoms please seek medical attention. If you develop thoughts of harming herself or others please seek medical attention.

## 2015-04-13 NOTE — Assessment & Plan Note (Addendum)
Patient's bilateral thoracic back pain that sharp in nature that radiated through to his chest. This is resolved. This is atypical for cardiac cause of pain. EKG is reassuring and unchanged from previous. No history of PE or risk factors for PE. No reflux. Doubt vascular cause given equal blood pressures and pulses bilaterally and stable vital signs. Doubt reflux given lack of symptoms. Does have a history of back issues in the past. Suspect likely related to musculoskeletal cause. Discussed obtaining x-ray of his chest versus back versus referral for evaluation of his chest pain, though the patient declined these opting to continue to monitor. He is given return precautions.

## 2015-04-13 NOTE — Progress Notes (Signed)
Pre visit review using our clinic review tool, if applicable. No additional management support is needed unless otherwise documented below in the visit note. 

## 2015-04-13 NOTE — Assessment & Plan Note (Signed)
Followed by psychiatry. Currently on Zoloft. Needs a refill to cover until he gets in to see the psychiatrist. No SI or HI. Given return precautions.

## 2015-04-13 NOTE — Assessment & Plan Note (Signed)
Glucose appears to be in the normal range at home. We'll check a BMP today. He'll continue his dietary changes.

## 2015-04-13 NOTE — Progress Notes (Signed)
Patient ID: Brad Riches., male   DOB: 04-04-1970, 45 y.o.   MRN: [id number]  Brad Rumps, MD Phone: [phone]  Brad Collinsworth. is a 45 y.o. male who presents today for follow-up.  DIABETES Disease Monitoring: Blood Sugar ranges-91-148 Polyuria/phagia/dipsia- no      Diet controlled. He has cut out [facility] and can't be completely. His decreases carbohydrate intake.  Back pain: Patient notes 3 weeks ago developed sharp stabbing discomfort between her shoulder blades. Notes he can feel this through to his chest. It is a dull pain in his chest. He had no shortness of breath or diaphoresis with this. Notes it got better for got up and moved around. Improved some with Aleve. No history of VTE. No recent surgeries or long trips. No leg swelling. No recent history of reflux. Notes it went on for about 2 weeks intermittently and went away this past Sunday. He does report a history of back pain and back surgeries in the past. No numbness or weakness with it.  Anxiety/depression: Patient notes for the past year or so he has been on Zoloft 50 mg daily. He is yet to follow-up with his psychiatrist. He notes he feels this is well controlled on Zoloft and he needs a refill. No SI or HI.   PMH: Former smoker.   ROS see history of present illness  Objective  Physical Exam Filed Vitals:   04/13/15 0858  BP: 116/72  Pulse: 78  Temp: 97.4 F (36.3 C)  Resp: 14    BP Readings from Last 3 Encounters:  04/13/15 116/72  04/07/15 118/72  03/21/15 118/74   Wt Readings from Last 3 Encounters:  04/13/15 222 lb 3.2 oz (100.789 kg)  04/07/15 223 lb 8 oz (101.379 kg)  03/21/15 226 lb 6.4 oz (102.694 kg)   Left arm blood pressure 110/72 Right arm blood pressure 116/74  Physical Exam  Constitutional: He is well-developed, well-nourished, and in no distress.  HENT:  Head: Normocephalic and atraumatic.  Right Ear: External ear normal.  Left Ear: External ear normal.  Mouth/Throat:  Oropharynx is clear and moist. No oropharyngeal exudate.  Eyes: Conjunctivae are normal. Pupils are equal, round, and reactive to light.  Cardiovascular: Normal rate, regular rhythm and normal heart sounds.  Exam reveals no gallop and no friction rub.   No murmur heard. 2+ radial pulses  Pulmonary/Chest: Effort normal and breath sounds normal. No respiratory distress. He has no wheezes. He has no rales.  Musculoskeletal:  No midline back tenderness or step-off, no midline neck tenderness or step-off, no muscular back tenderness, no chest tenderness  Neurological: He is alert. Gait normal.  5/5 strength in bilateral biceps, triceps, grip, quads, hamstrings, plantar and dorsiflexion, sensation to light touch intact in bilateral UE and LE, normal gait, 2+ patellar reflexes  Skin: Skin is warm and dry. He is not diaphoretic.  Psychiatric: Mood and affect normal.   EKG: Normal sinus rhythm, rate 70, right bundle branch block, T-wave inversion in lead 3, no other ST or T-wave changes  Assessment/Plan: Please see individual problem list.  Diabetes type 2, uncontrolled ([facility]) Glucose appears to be in the normal range at home. We'll check a BMP today. He'll continue his dietary changes.  Anxiety and depression Followed by psychiatry. Currently on Zoloft. Needs a refill to cover until he gets in to see the psychiatrist. No SI or HI. Given return precautions.  Thoracic back pain Patient's bilateral thoracic back pain that sharp in nature that  radiated through to his chest. This is resolved. This is atypical for cardiac cause of pain. EKG is reassuring and unchanged from previous. No history of PE or risk factors for PE. No reflux. Doubt vascular cause given equal blood pressures and pulses bilaterally and stable vital signs. Doubt reflux given lack of symptoms. Does have a history of back issues in the past. Suspect likely related to musculoskeletal cause. Discussed obtaining x-ray of his chest versus  back versus referral for evaluation of his chest pain, though the patient declined these opting to continue to monitor. He is given return precautions.    Orders Placed This Encounter  Procedures  . EKG 12-Lead    Meds ordered this encounter  Medications  . sertraline (ZOLOFT) 50 MG tablet    Sig: Take 1 tablet (50 mg total) by mouth daily.    Dispense:  90 tablet    Refill:  0     Dragon voice recognition software was used during the dictation process of this note. If any phrases or words seem inappropriate it is likely secondary to the translation process being inefficient.  Brad Rumps

## 2015-04-26 ENCOUNTER — Other Ambulatory Visit: Payer: Medicare Other

## 2015-04-27 ENCOUNTER — Other Ambulatory Visit (INDEPENDENT_AMBULATORY_CARE_PROVIDER_SITE_OTHER): Payer: Medicare Other

## 2015-04-27 DIAGNOSIS — E119 Type 2 diabetes mellitus without complications: Secondary | ICD-10-CM | POA: Diagnosis not present

## 2015-04-27 LAB — LIPID PANEL
CHOL/HDL RATIO: 5
Cholesterol: 179 mg/dL (ref 0–200)
HDL: 32.6 mg/dL — AB (ref >39.00)
LDL CALC: 107 mg/dL — AB (ref 0–99)
NONHDL: 146.37
Triglycerides: 196 mg/dL — ABNORMAL HIGH (ref 0.0–149.0)
VLDL: 39.2 mg/dL (ref 0.0–40.0)

## 2015-04-27 LAB — BASIC METABOLIC PANEL
BUN: 17 mg/dL (ref 6–23)
CO2: 26 meq/L (ref 19–32)
Calcium: 9.1 mg/dL (ref 8.4–10.5)
Chloride: 105 mEq/L (ref 96–112)
Creatinine, Ser: 0.73 mg/dL (ref 0.40–1.50)
GFR: 123.14 mL/min (ref >60.00)
GLUCOSE: 109 mg/dL — AB (ref 70–99)
POTASSIUM: 4 meq/L (ref 3.5–5.1)
SODIUM: 137 meq/L (ref 135–145)

## 2015-04-29 ENCOUNTER — Telehealth: Payer: Self-pay | Admitting: Family Medicine

## 2015-04-29 MED ORDER — SERTRALINE HCL 50 MG PO TABS: 50.0000 mg | ORAL_TABLET | Freq: Every day | ORAL | Status: DC

## 2015-04-29 MED ORDER — ATORVASTATIN CALCIUM 40 MG PO TABS: 40.0000 mg | ORAL_TABLET | Freq: Every day | ORAL | Status: DC

## 2015-04-29 NOTE — Telephone Encounter (Signed)
Called and spoke with patient regarding lab work. Advised that he would benefit from being on a cholesterol medicine. Lipitor sent to his pharmacy. He also noted he needed a refill on his Zoloft. I refilled this for him. We will see him back as scheduled in February and check liver function tests at that time.

## 2015-05-02 ENCOUNTER — Encounter: Payer: Medicare Other | Admitting: Dietician

## 2015-05-02 ENCOUNTER — Encounter: Payer: Self-pay | Admitting: Dietician

## 2015-05-02 VITALS — Ht 68.0 in | Wt 224.9 lb

## 2015-05-02 DIAGNOSIS — E119 Type 2 diabetes mellitus without complications: Secondary | ICD-10-CM | POA: Diagnosis not present

## 2015-05-02 NOTE — Progress Notes (Signed)

## 2015-05-09 ENCOUNTER — Encounter: Payer: Medicare Other | Attending: Family Medicine

## 2015-05-09 VITALS — Wt 225.2 lb

## 2015-05-09 DIAGNOSIS — E119 Type 2 diabetes mellitus without complications: Secondary | ICD-10-CM | POA: Insufficient documentation

## 2015-05-10 NOTE — Progress Notes (Signed)
Appt. Start Time: 1730 Appt. End Time: 2030  Pt c/o sore throat and cough-no fever  Class 2 Diabetes Overview - define DM; state own type of DM; identify functions of pancreas and insulin; define insulin deficiency vs insulin resistance  Psychosocial - identify DM as a source of stress; state the effects of stress on BG control; verbalize appropriate stress management techniques; identify personal stress issues   Nutritional Management - describe effects of food on blood glucose; identify sources of carbohydrate, protein and fat; verbalize the importance of balance meals in controlling blood glucose; identify meals as well balanced or not; estimate servings of carbohydrate from menus; use food labels to identify servings size, content of carbohydrate, fiber, protein, fat, saturated fat and sodium; recognize food sources of fat, saturated fat, trans fat, sodium and verbalize goals for intake; describe healthful appropriate food choices when dining out   Exercise - describe the effects of exercise on blood glucose and importance of regular exercise in controlling diabetes; state a plan for personal exercise; verbalize contraindications for exercise  Medications - state name, dose, timing of currently prescribed medications; describe types of medications available for diabetes  Self-Monitoring - state importance of HBGM and demo procedure accurately; use HBGM results to effectively manage diabetes; identify importance of regular HbA1C testing and goals for results  Acute Complications/Sick Day Guidelines - recognize hyperglycemia and hypoglycemia with causes and effects; identify blood glucose results as high, low or in control; list steps in treating and preventing high and low blood glucose; state appropriate measure to manage blood glucose when ill (need for meds, HBGM plan, when to call physician, need for fluids)  Chronic Complications/Foot, Skin, Eye Dental Care - identify possible long-term  complications of diabetes (retinopathy, neuropathy, nephropathy, cardiovascular disease, infections); explain steps in prevention and treatment of chronic complications; state importance of daily self-foot exams; describe how to examine feet and what to look for; explain appropriate eye and dental care  Lifestyle Changes/Goals & Health/Community Resources - state benefits of making appropriate lifestyle changes; identify habits that need to change (meals, tobacco, alcohol); identify strategies to reduce risk factors for personal health; set goals for proper diabetes care; state need for and frequency of healthcare follow-up; describe appropriate community resources for good health (ADA, web sites, apps)   Pregnancy/Sexual Health - define gestational diabetes; state importance of good blood glucose control and birth control prior to pregnancy; state importance of good blood glucose control in preventing sexual problems (impotence, vaginal dryness, infections, loss of desire); state relationship of blood glucose control and pregnancy outcome; describe risk of maternal and fetal complications  Teaching Materials Used: Class 2 Slide Packet A1C Pamphlet Foot Care Literature Menu Ideas Goals for Class 2

## 2015-05-16 ENCOUNTER — Ambulatory Visit: Payer: Medicare Other | Admitting: Family Medicine

## 2015-05-16 ENCOUNTER — Encounter: Payer: Medicare Other | Admitting: Dietician

## 2015-05-16 VITALS — BP 120/88 | Ht 68.0 in | Wt 223.1 lb

## 2015-05-16 DIAGNOSIS — E119 Type 2 diabetes mellitus without complications: Secondary | ICD-10-CM | POA: Diagnosis not present

## 2015-05-16 NOTE — Progress Notes (Signed)

## 2015-05-18 ENCOUNTER — Encounter: Payer: Self-pay | Admitting: *Deleted

## 2015-06-07 ENCOUNTER — Encounter: Payer: Self-pay | Admitting: Family Medicine

## 2015-06-07 ENCOUNTER — Ambulatory Visit (INDEPENDENT_AMBULATORY_CARE_PROVIDER_SITE_OTHER): Payer: Medicare Other | Admitting: Family Medicine

## 2015-06-07 VITALS — BP 112/64 | HR 68 | Temp 98.1°F | Ht 68.0 in | Wt 224.0 lb

## 2015-06-07 DIAGNOSIS — E119 Type 2 diabetes mellitus without complications: Secondary | ICD-10-CM

## 2015-06-07 DIAGNOSIS — Z23 Encounter for immunization: Secondary | ICD-10-CM | POA: Diagnosis not present

## 2015-06-07 DIAGNOSIS — E7800 Pure hypercholesterolemia, unspecified: Secondary | ICD-10-CM | POA: Insufficient documentation

## 2015-06-07 DIAGNOSIS — R252 Cramp and spasm: Secondary | ICD-10-CM | POA: Diagnosis not present

## 2015-06-07 DIAGNOSIS — E1165 Type 2 diabetes mellitus with hyperglycemia: Secondary | ICD-10-CM | POA: Diagnosis not present

## 2015-06-07 DIAGNOSIS — IMO0001 Reserved for inherently not codable concepts without codable children: Secondary | ICD-10-CM

## 2015-06-07 LAB — HEMOGLOBIN A1C: Hgb A1c MFr Bld: 5.6 % (ref 4.6–6.5)

## 2015-06-07 LAB — LDL CHOLESTEROL, DIRECT: Direct LDL: 119 mg/dL

## 2015-06-07 LAB — CK: CK TOTAL: 56 U/L (ref 7–232)

## 2015-06-07 NOTE — Progress Notes (Signed)
Patient ID: Brad Riches., male   DOB: [birth year], 45 y.o.   MRN: [id number]  Brad Rumps, MD Phone: [phone]  Brad Dinan. is a 45 y.o. male who presents today for follow-up.  DIABETES Disease Monitoring: Blood Sugar ranges-81-189, fasting 112 to 115, typically less than 150 Polyuria/phagia/dipsia- some polyphagia, no polyuria or polydipsia, polyphagia started after starting the Lipitor      ophthalmology- saw on December Currently diet controlled. Has cutback on his portions. Has decreased his soda intake to none. Does still drink some sweet tea. He is seeing a diabetic educator.  Elevated LDL: Patient started on Lipitor for cardiovascular protection and mildly elevated LDL. He is tolerating this well. He does note some mild hand cramps though no other muscle aches. Denies right upper quadrant pain. No chest pain or shortness of breath. No claudication.  Hand cramps: Notes these started 2-3 weeks ago. 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 It is a shooting pain in the middle of his palm. No numbness or weakness with this. Notes that last briefly. Typically occurs towards the end of the day. Has been drinking water. No other cramps elsewhere.  PMH: Former smoker   ROS see history of present illness   Objective  Physical Exam Filed Vitals:   06/07/15 1025  BP: 112/64  Pulse: 68  Temp: 98.1 F (36.7 C)    BP Readings from Last 3 Encounters:  06/07/15 112/64  05/16/15 120/88  04/13/15 116/72   Wt Readings from Last 3 Encounters:  06/07/15 224 lb (101.606 kg)  05/16/15 223 lb 1.6 oz (101.197 kg)  05/10/15 225 lb 3.2 oz (102.15 kg)    Physical Exam  Constitutional: He is well-developed, well-nourished, and in no distress.  HENT:  Head: Normocephalic and atraumatic.  Right Ear: External ear normal.  Left Ear: External ear normal.  Mouth/Throat: Oropharynx is clear and moist. No oropharyngeal exudate.  Eyes:  Conjunctivae are normal. Pupils are equal, round, and reactive to light.  Cardiovascular: Normal rate, regular rhythm and normal heart sounds.  Exam reveals no gallop and no friction rub.   No murmur heard. Pulmonary/Chest: Effort normal and breath sounds normal. No respiratory distress. He has no wheezes. He has no rales.  Abdominal: Soft. Bowel sounds are normal. He exhibits no distension. There is no tenderness. There is no rebound and no guarding.  Musculoskeletal: He exhibits no edema.  Bilateral hands with no abnormalities, no swelling, no tenderness, no erythema, full range of motion of his hands and fingers, sensation to light touch intact in bilateral hands, 5 out of 5 grip strength bilaterally, good cap refill  Neurological: He is alert. Gait normal.  Skin: Skin is warm and dry. He is not diaphoretic.     Assessment/Plan: Please see individual problem list.  Diabetes type 2, uncontrolled (HCC) Fasting sugars are at goal. Diet controlled. We will check an A1c today.  Elevated LDL cholesterol level Mildly elevated on last check. Is on Lipitor for cardiovascular protection given that he has diabetes and mildly elevated LDL. We will check a direct LDL today. Check a CMP as well. We'll check a CK given hand cramps.  Hand cramps Several weeks of hand cramps intermittently with increasing activity with his hands. Given focality I doubt this is related to the Lipitor, though we will check a CK to ensure this. We'll check electrolytes as well. Advised on staying well-hydrated. Advised on massaging and stretching his  hands. Advised to rest his hands as well.    Orders Placed This Encounter  Procedures  . Tdap vaccine greater than or equal to 7yo IM  . Pneumococcal polysaccharide vaccine 23-valent greater than or equal to 2yo subcutaneous/IM  . HgB A1c  . Direct LDL  . CK (Creatine Kinase)    No orders of the defined types were placed in this encounter.    # Healthcare  maintenance: Tdap and Pneumovax given today   Brad Rumps, MD [place]

## 2015-06-07 NOTE — Assessment & Plan Note (Signed)
Fasting sugars are at goal. Diet controlled. We will check an A1c today.

## 2015-06-07 NOTE — Assessment & Plan Note (Signed)
Mildly elevated on last check. Is on Lipitor for cardiovascular protection given that he has diabetes and mildly elevated LDL. We will check a direct LDL today. Check a CMP as well. We'll check a CK given hand cramps.

## 2015-06-07 NOTE — Patient Instructions (Signed)
Nice to see you. Please continue managing your diet to help with your diabetes. We are going check some lab work today to follow-up on this. Please stay well hydrated and stretch and massage your hands with regards to your cramps. We will check some lab work to look at this further. If you develop worsening cramps, or chest pain, shortness of breath, abdominal pain, or muscle aches, or any new or changing symptoms please seek medical attention.

## 2015-06-07 NOTE — Progress Notes (Signed)
Pre visit review using our clinic review tool, if applicable. No additional management support is needed unless otherwise documented below in the visit note. 

## 2015-06-07 NOTE — Assessment & Plan Note (Signed)
Several weeks of hand cramps intermittently with increasing activity with his hands. Given focality I doubt this is related to the Lipitor, though we will check a CK to ensure this. We'll check electrolytes as well. Advised on staying well-hydrated. Advised on massaging and stretching his hands. Advised to rest his hands as well.

## 2015-06-09 ENCOUNTER — Encounter: Payer: Self-pay | Admitting: Family Medicine

## 2015-09-01 ENCOUNTER — Encounter: Payer: Self-pay | Admitting: Family Medicine

## 2015-09-01 ENCOUNTER — Ambulatory Visit (INDEPENDENT_AMBULATORY_CARE_PROVIDER_SITE_OTHER): Payer: Medicare Other | Admitting: Family Medicine

## 2015-09-01 VITALS — BP 112/78 | HR 70 | Temp 98.3°F | Wt 229.4 lb

## 2015-09-01 DIAGNOSIS — E119 Type 2 diabetes mellitus without complications: Secondary | ICD-10-CM | POA: Diagnosis not present

## 2015-09-01 DIAGNOSIS — F329 Major depressive disorder, single episode, unspecified: Secondary | ICD-10-CM

## 2015-09-01 DIAGNOSIS — IMO0001 Reserved for inherently not codable concepts without codable children: Secondary | ICD-10-CM

## 2015-09-01 DIAGNOSIS — F32A Depression, unspecified: Secondary | ICD-10-CM

## 2015-09-01 DIAGNOSIS — E785 Hyperlipidemia, unspecified: Secondary | ICD-10-CM

## 2015-09-01 DIAGNOSIS — E7800 Pure hypercholesterolemia, unspecified: Secondary | ICD-10-CM

## 2015-09-01 DIAGNOSIS — F419 Anxiety disorder, unspecified: Secondary | ICD-10-CM

## 2015-09-01 DIAGNOSIS — F418 Other specified anxiety disorders: Secondary | ICD-10-CM | POA: Diagnosis not present

## 2015-09-01 DIAGNOSIS — E1165 Type 2 diabetes mellitus with hyperglycemia: Secondary | ICD-10-CM

## 2015-09-01 LAB — COMPREHENSIVE METABOLIC PANEL
ALT: 28 U/L (ref 0–53)
AST: 18 U/L (ref 0–37)
Albumin: 4.4 g/dL (ref 3.5–5.2)
Alkaline Phosphatase: 103 U/L (ref 39–117)
BILIRUBIN TOTAL: 0.6 mg/dL (ref 0.2–1.2)
BUN: 15 mg/dL (ref 6–23)
CALCIUM: 9.4 mg/dL (ref 8.4–10.5)
CO2: 27 meq/L (ref 19–32)
CREATININE: 0.75 mg/dL (ref 0.40–1.50)
Chloride: 102 mEq/L (ref 96–112)
GFR: 119.18 mL/min (ref >60.00)
Glucose, Bld: 106 mg/dL — ABNORMAL HIGH (ref 70–99)
Potassium: 3.7 mEq/L (ref 3.5–5.1)
Sodium: 137 mEq/L (ref 135–145)
TOTAL PROTEIN: 7.4 g/dL (ref 6.0–8.3)

## 2015-09-01 LAB — HEMOGLOBIN A1C: Hgb A1c MFr Bld: 5.9 % (ref 4.6–6.5)

## 2015-09-01 LAB — LDL CHOLESTEROL, DIRECT: LDL DIRECT: 84 mg/dL

## 2015-09-01 NOTE — Assessment & Plan Note (Signed)
Home blood sugars well controlled. Check an A1c today.

## 2015-09-01 NOTE — Assessment & Plan Note (Signed)
Well-controlled. Stable on Zoloft. Continue to monitor. Given return precautions.

## 2015-09-01 NOTE — Progress Notes (Signed)
Patient ID: Brad Jimenez., male   DOB: 07/29/1970, 45 y.o.   MRN: [id number]  Brad Rumps, MD Phone: [phone]  Brad Jimenez. is a 45 y.o. male who presents today for f/u.  DIABETES Disease Monitoring: Blood Sugar ranges-120-123 Polyuria/phagia/dipsia- some polyphagia      Visual problems- no Diet controlled. Eating some junk food and hot dogs. Has cut out sugary drinks. Is drinking unsweet tea and sugar-free drinks.  HYPERLIPIDEMIA Symptoms Chest pain on exertion:  No   Leg claudication:   No Medications: Compliance- taking Lipitor 80 mg Right upper quadrant pain- no  Muscle aches- no  Anxiety/depression: Patient notes this is leveled out and is not an issue at this time. Is taking 50 mg of Zoloft feels well. No SI.  PMH: Former smoker   ROS see history of present illness  Objective  Physical Exam Filed Vitals:   09/01/15 0951  BP: 112/78  Pulse: 70  Temp: 98.3 F (36.8 C)    BP Readings from Last 3 Encounters:  09/01/15 112/78  06/07/15 112/64  05/16/15 120/88   Wt Readings from Last 3 Encounters:  09/01/15 229 lb 6.4 oz (104.055 kg)  06/07/15 224 lb (101.606 kg)  05/16/15 223 lb 1.6 oz (101.197 kg)    Physical Exam  Constitutional: He is well-developed, well-nourished, and in no distress.  HENT:  Head: Normocephalic and atraumatic.  Right Ear: External ear normal.  Left Ear: External ear normal.  Cardiovascular: Normal rate, regular rhythm and normal heart sounds.   Pulmonary/Chest: Effort normal and breath sounds normal.  Neurological: He is alert. Gait normal.  Skin: Skin is warm and dry. He is not diaphoretic.  Psychiatric: Mood and affect normal.     Assessment/Plan: Please see individual problem list.  Anxiety and depression Well-controlled. Stable on Zoloft. Continue to monitor. Given return precautions.  Diabetes type 2, uncontrolled ([place]) Home blood sugars well controlled. Check an A1c today.  Elevated LDL cholesterol  level Tolerating Lipitor. Has had some increased hunger with this though this could be related to diabetes. We'll check an LDL cholesterol today.    Orders Placed This Encounter  Procedures  . HgB A1c  . Comp Met (CMET)  . Direct LDL    Brad Rumps, MD [place]

## 2015-09-01 NOTE — Patient Instructions (Signed)
Nice to see you. Please work on your diet and cutting out some of the hot dogs and junk food. Please continue your Lipitor. Please continue your Zoloft. If you develop thoughts of harming yourself or others, or any new or changing symptoms please seek medical attention. We will call with results of your lab work.

## 2015-09-01 NOTE — Assessment & Plan Note (Signed)
Tolerating Lipitor. Has had some increased hunger with this though this could be related to diabetes. We'll check an LDL cholesterol today.

## 2015-12-01 ENCOUNTER — Other Ambulatory Visit: Payer: Self-pay | Admitting: Family Medicine

## 2015-12-06 ENCOUNTER — Ambulatory Visit: Payer: Medicare Other | Admitting: Family Medicine

## 2016-01-05 ENCOUNTER — Ambulatory Visit: Payer: Medicare Other | Admitting: Family Medicine

## 2016-01-16 ENCOUNTER — Ambulatory Visit (INDEPENDENT_AMBULATORY_CARE_PROVIDER_SITE_OTHER): Payer: Medicare Other | Admitting: Family Medicine

## 2016-01-16 ENCOUNTER — Encounter: Payer: Self-pay | Admitting: Family Medicine

## 2016-01-16 VITALS — BP 122/84 | HR 76 | Temp 98.2°F | Wt 238.8 lb

## 2016-01-16 DIAGNOSIS — E119 Type 2 diabetes mellitus without complications: Secondary | ICD-10-CM

## 2016-01-16 DIAGNOSIS — E7800 Pure hypercholesterolemia, unspecified: Secondary | ICD-10-CM | POA: Diagnosis not present

## 2016-01-16 MED ORDER — ATORVASTATIN CALCIUM 80 MG PO TABS: 80.0000 mg | ORAL_TABLET | Freq: Every day | ORAL | 3 refills | Status: DC

## 2016-01-16 NOTE — Assessment & Plan Note (Signed)
Tolerating medication. Check CMP today. Continue Lipitor.

## 2016-01-16 NOTE — Progress Notes (Signed)
Pre visit review using our clinic review tool, if applicable. No additional management support is needed unless otherwise documented below in the visit note. 

## 2016-01-16 NOTE — Patient Instructions (Signed)
Nice to see you. We'll check some lab work and call you with the results. Please work on diet and exercise as we discussed.

## 2016-01-16 NOTE — Assessment & Plan Note (Signed)
Well controlled. Check A1c today. Encouraged continued diet and exercise.

## 2016-01-16 NOTE — Progress Notes (Signed)
  [name], MD Phone: [phone]  Brad Jimenez. is a 45 y.o. male who presents today for f/u.  HYPERLIPIDEMIA Symptoms Chest pain on exertion:  No    Leg claudication:   no Medications: Compliance- taking lipitor 80 mg Right upper quadrant pain- no  Muscle aches- no  DIABETES Disease Monitoring: Blood Sugar ranges-120-130 Polyuria/phagia/dipsia- no      Visual problems- none Diet controlled. Notes he has been stress eating more recently. Not eating many sweets. Is eating more hamburgers hotdogs and that kind of food. No [facility]. He is drinking unsweet tea. 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  Cardiovascular: Normal rate, regular rhythm and normal heart sounds.   Pulmonary/Chest: Effort normal and breath sounds normal.  Abdominal: Soft. Bowel sounds are normal. He exhibits no distension. There is no tenderness.  Neurological: He is alert. Gait normal.  Skin: Skin is warm and dry.     Assessment/Plan: Please see individual problem list.  Diabetes ([place]) Well controlled. Check A1c today. Encouraged continued diet and exercise.  Elevated LDL cholesterol level Tolerating medication. Check CMP today. Continue Lipitor.   Orders Placed This Encounter  Procedures  . Comp Met (CMET)  . HgB A1c    Meds ordered this encounter  Medications  . atorvastatin (LIPITOR) 80 MG tablet    Sig: Take 1 tablet (80 mg total) by mouth  daily.    Dispense:  90 tablet    Refill:  [place], MD [place]

## 2016-01-17 LAB — HEMOGLOBIN A1C: HEMOGLOBIN A1C: 6.1 % (ref 4.6–6.5)

## 2016-01-17 LAB — COMPREHENSIVE METABOLIC PANEL
ALBUMIN: 4.4 g/dL (ref 3.5–5.2)
ALT: 34 U/L (ref 0–53)
AST: 16 U/L (ref 0–37)
Alkaline Phosphatase: 96 U/L (ref 39–117)
BILIRUBIN TOTAL: 0.6 mg/dL (ref 0.2–1.2)
BUN: 18 mg/dL (ref 6–23)
CALCIUM: 9.7 mg/dL (ref 8.4–10.5)
CO2: 27 mEq/L (ref 19–32)
CREATININE: 0.78 mg/dL (ref 0.40–1.50)
Chloride: 101 mEq/L (ref 96–112)
GFR: 113.71 mL/min (ref >60.00)
Glucose, Bld: 164 mg/dL — ABNORMAL HIGH (ref 70–99)
Potassium: 4 mEq/L (ref 3.5–5.1)
Sodium: 136 mEq/L (ref 135–145)
TOTAL PROTEIN: 7.1 g/dL (ref 6.0–8.3)

## 2016-01-19 ENCOUNTER — Telehealth: Payer: Self-pay | Admitting: Surgical

## 2016-01-19 NOTE — Telephone Encounter (Signed)
Spoke with patient wife and gave her patients lab results. These was also resulted to My chart

## 2016-01-20 NOTE — Telephone Encounter (Signed)
error 

## 2016-01-30 ENCOUNTER — Telehealth: Payer: Self-pay | Admitting: Family Medicine

## 2016-01-30 MED ORDER — ATORVASTATIN CALCIUM 80 MG PO TABS: 80.0000 mg | ORAL_TABLET | Freq: Every day | ORAL | 3 refills | Status: DC

## 2016-01-30 NOTE — Telephone Encounter (Signed)
Pt needs refill on  LIPITOR 80 MG tablet sent to [address] not to [facility]

## 2016-01-30 NOTE — Telephone Encounter (Signed)
RX sent to pharmacy  

## 2016-04-18 ENCOUNTER — Ambulatory Visit: Payer: Medicare Other | Admitting: Family Medicine

## 2016-05-08 ENCOUNTER — Ambulatory Visit (INDEPENDENT_AMBULATORY_CARE_PROVIDER_SITE_OTHER): Payer: Medicare Other | Admitting: Family Medicine

## 2016-05-08 ENCOUNTER — Encounter: Payer: Self-pay | Admitting: Family Medicine

## 2016-05-08 ENCOUNTER — Telehealth: Payer: Self-pay | Admitting: Family Medicine

## 2016-05-08 VITALS — BP 120/82 | HR 72 | Temp 97.9°F | Wt 233.2 lb

## 2016-05-08 DIAGNOSIS — E119 Type 2 diabetes mellitus without complications: Secondary | ICD-10-CM | POA: Diagnosis not present

## 2016-05-08 DIAGNOSIS — E7800 Pure hypercholesterolemia, unspecified: Secondary | ICD-10-CM | POA: Diagnosis not present

## 2016-05-08 DIAGNOSIS — M722 Plantar fascial fibromatosis: Secondary | ICD-10-CM | POA: Diagnosis not present

## 2016-05-08 LAB — HEMOGLOBIN A1C: HEMOGLOBIN A1C: 8 % — AB (ref 4.6–6.5)

## 2016-05-08 LAB — LIPID PANEL
Cholesterol: 140 mg/dL (ref 0–200)
HDL: 37.6 mg/dL — ABNORMAL LOW (ref >39.00)
LDL Cholesterol: 65 mg/dL (ref 0–99)
NONHDL: 101.98
Total CHOL/HDL Ratio: 4
Triglycerides: 186 mg/dL — ABNORMAL HIGH (ref 0.0–149.0)
VLDL: 37.2 mg/dL (ref 0.0–40.0)

## 2016-05-08 LAB — COMPREHENSIVE METABOLIC PANEL
ALK PHOS: 120 U/L — AB (ref 39–117)
ALT: 53 U/L (ref 0–53)
AST: 19 U/L (ref 0–37)
Albumin: 4.6 g/dL (ref 3.5–5.2)
BILIRUBIN TOTAL: 0.5 mg/dL (ref 0.2–1.2)
BUN: 14 mg/dL (ref 6–23)
CO2: 29 mEq/L (ref 19–32)
Calcium: 9.4 mg/dL (ref 8.4–10.5)
Chloride: 101 mEq/L (ref 96–112)
Creatinine, Ser: 0.77 mg/dL (ref 0.40–1.50)
GFR: 115.26 mL/min (ref >60.00)
Glucose, Bld: 250 mg/dL — ABNORMAL HIGH (ref 70–99)
Potassium: 3.9 mEq/L (ref 3.5–5.1)
SODIUM: 134 meq/L — AB (ref 135–145)
TOTAL PROTEIN: 7.9 g/dL (ref 6.0–8.3)

## 2016-05-08 MED ORDER — GLIPIZIDE 5 MG PO TABS: 5.0000 mg | ORAL_TABLET | Freq: Every day | ORAL | 3 refills | Status: DC

## 2016-05-08 MED ORDER — EMPAGLIFLOZIN 10 MG PO TABS: 10.0000 mg | ORAL_TABLET | Freq: Every day | ORAL | 3 refills | Status: DC

## 2016-05-08 NOTE — Telephone Encounter (Signed)
Spoke with patient. I sent glipizide into his pharmacy. Discussed potential for hypoglycemia with this. Encouraged him to eat when taking this. Discussed lab work with him. We'll recheck his alkaline phosphatase at his visit in one month.

## 2016-05-08 NOTE — Assessment & Plan Note (Signed)
Continue Lipitor.  Check LDL. 

## 2016-05-08 NOTE — Progress Notes (Signed)
Pre visit review using our clinic review tool, if applicable. No additional management support is needed unless otherwise documented below in the visit note. 

## 2016-05-08 NOTE — Telephone Encounter (Signed)
Pt called and left voicemail stating that medication that Dr. Caryl Bis prescribed is too expensive and he cannot afford. Please advise, thank you!  Call pt @ [phone]

## 2016-05-08 NOTE — Assessment & Plan Note (Signed)
Description of right foot pain and location of tenderness most consistent with plantar fasciitis. Discussed stretches and icing. Continue to monitor.

## 2016-05-08 NOTE — Progress Notes (Signed)
  [name], MD Phone: [phone]  Brad Jimenez. is a 46 y.o. male who presents today for f/u.  DIABETES Disease Monitoring: Blood Sugar ranges-150-315, 315 this morning Polyuria/phagia/dipsia- no      Visual problems- no Not taking any medications. Does feel tired and ornery.  Hyperlipidemia: Taking Lipitor. No chest pain or shortness of breath.  Patient does note right foot pain. Feels like a bruise. Hurts more when he first gets on it in the morning and after sitting down for a long period of time. Improves the more he walks on it. Feels like he may have stepped on a bar while wearing flip-flops and that could be the cause. Has been improving some.   PMH: Former smoker   ROS see history of present illness  Objective  Physical Exam Vitals:   05/08/16 1032  BP: 120/82  Pulse: 72  Temp: 97.9 F (36.6 C)    BP Readings from Last 3 Encounters:  05/08/16 120/82  01/16/16 122/84  09/01/15 112/78   Wt Readings from Last 3 Encounters:  05/08/16 233 lb 3.2 oz (105.8 kg)  01/16/16 238 lb 12.8 oz (108.3 kg)  09/01/15 229 lb 6.4 oz (104.1 kg)    Physical Exam  Constitutional: He is well-developed, well-nourished, and in no distress.  HENT:  Head: Normocephalic and atraumatic.  Cardiovascular: Normal rate, regular rhythm and normal heart sounds.   Pulmonary/Chest: Effort normal and breath sounds normal.  Musculoskeletal:  Right posterior plantar fascia aspect with minimal tenderness  Neurological: He is alert. Gait normal.   Diabetic Foot Exam - Simple   Simple Foot Form Diabetic Foot exam was performed with the following findings:  Yes 05/08/2016 10:50 AM  Visual Inspection No deformities, no ulcerations, no other skin breakdown bilaterally:  Yes Sensation Testing Intact to touch and monofilament testing bilaterally:  Yes Pulse Check Posterior Tibialis and Dorsalis pulse intact bilaterally:  Yes Comments     Assessment/Plan: Please see individual  problem list.  Diabetes ([place]) Uncontrolled. Has been rising with his home CBGs. We will start him on Jardiance as he was intolerant of metformin previously. We'll check an A1c and a CMP. He'll follow-up in one month.  Elevated LDL cholesterol level Continue Lipitor. Check LDL.  Plantar fasciitis of right foot Description of right foot pain and location of tenderness most consistent with plantar fasciitis. Discussed stretches and icing. Continue to monitor.   Orders Placed This Encounter  Procedures  . Comp Met (CMET)  . Lipid Profile  . HgB A1c    Meds ordered this encounter  Medications  . empagliflozin (JARDIANCE) 10 MG TABS tablet    Sig: Take 10 mg by mouth daily.    Dispense:  30 tablet    Refill:  [place], MD [place]

## 2016-05-08 NOTE — Assessment & Plan Note (Signed)
Uncontrolled. Has been rising with his home CBGs. We will start him on Jardiance as he was intolerant of metformin previously. We'll check an A1c and a CMP. He'll follow-up in one month.

## 2016-05-08 NOTE — Patient Instructions (Signed)
Nice to see you. We will place you on Jardiance for your diabetes. This may cause slight increase in urination over the first few days and then should even out. There is a small risk of yeast infections and UTIs with this. If you develop any burning or urinary urgency or frequency please let us know. You can stretch your foot as we discussed in the office and ice your foot.

## 2016-05-08 NOTE — Telephone Encounter (Signed)
Please advise 

## 2016-05-22 ENCOUNTER — Ambulatory Visit: Payer: Medicare Other | Admitting: Family Medicine

## 2016-06-08 ENCOUNTER — Ambulatory Visit (INDEPENDENT_AMBULATORY_CARE_PROVIDER_SITE_OTHER): Payer: Medicare Other | Admitting: Family Medicine

## 2016-06-08 VITALS — BP 126/88 | HR 82 | Temp 97.8°F | Wt 240.4 lb

## 2016-06-08 DIAGNOSIS — R748 Abnormal levels of other serum enzymes: Secondary | ICD-10-CM

## 2016-06-08 DIAGNOSIS — E119 Type 2 diabetes mellitus without complications: Secondary | ICD-10-CM | POA: Diagnosis not present

## 2016-06-08 LAB — MICROALBUMIN / CREATININE URINE RATIO
CREATININE, U: 112.6 mg/dL
MICROALB/CREAT RATIO: 1.3 mg/g (ref 0.0–30.0)
Microalb, Ur: 1.5 mg/dL (ref 0.0–1.9)

## 2016-06-08 LAB — COMPREHENSIVE METABOLIC PANEL
ALT: 39 U/L (ref 0–53)
AST: 20 U/L (ref 0–37)
Albumin: 4.2 g/dL (ref 3.5–5.2)
Alkaline Phosphatase: 116 U/L (ref 39–117)
BUN: 13 mg/dL (ref 6–23)
CO2: 27 meq/L (ref 19–32)
Calcium: 9.4 mg/dL (ref 8.4–10.5)
Chloride: 103 mEq/L (ref 96–112)
Creatinine, Ser: 0.8 mg/dL (ref 0.40–1.50)
GFR: 110.25 mL/min (ref >60.00)
GLUCOSE: 222 mg/dL — AB (ref 70–99)
POTASSIUM: 4.2 meq/L (ref 3.5–5.1)
SODIUM: 136 meq/L (ref 135–145)
Total Bilirubin: 0.6 mg/dL (ref 0.2–1.2)
Total Protein: 7.2 g/dL (ref 6.0–8.3)

## 2016-06-08 MED ORDER — GLIPIZIDE 5 MG PO TABS: 5.0000 mg | ORAL_TABLET | Freq: Two times a day (BID) | ORAL | 3 refills | Status: DC

## 2016-06-08 NOTE — Progress Notes (Signed)
  [name], MD Phone: [phone]  Brad Jimenez. is a 46 y.o. male who presents today for f/u.  DIABETES Disease Monitoring: Blood Sugar ranges-150-170 Polyuria/phagia/dipsia- mild polyuria      Medications: Compliance- taking glipizide Hypoglycemic symptoms- no Alkaline phosphatase was minimally elevated previously. Plan to recheck today. Continues on Lipitor.  ROS see history of present illness  Objective  Physical Exam Vitals:   06/08/16 1508  BP: 126/88  Pulse: 82  Temp: 97.8 F (36.6 C)    BP Readings from Last 3 Encounters:  06/08/16 126/88  05/08/16 120/82  01/16/16 122/84   Wt Readings from Last 3 Encounters:  06/08/16 240 lb 6.4 oz (109 kg)  05/08/16 233 lb 3.2 oz (105.8 kg)  01/16/16 238 lb 12.8 oz (108.3 kg)    Physical Exam  Constitutional: He is well-developed, well-nourished, and in no distress.  Cardiovascular: Normal rate, regular rhythm and normal heart sounds.   Pulmonary/Chest: Effort normal and breath sounds normal.     Assessment/Plan: Please see individual problem list.  Diabetes ([place]) Still not at goal. We will increase glipizide to 5 mg twice daily. Check a CMP. Follow-up in 2 months.  Elevated alkaline phosphatase level Minimally elevated previously. We will check a CMP today.   Orders Placed This Encounter  Procedures  . Urine Microalbumin w/creat. ratio  . Comp Met (CMET)    Meds ordered this encounter  Medications  . glipiZIDE (GLUCOTROL) 5 MG tablet    Sig: Take 1 tablet (5 mg total) by mouth 2 (two) times daily before a meal.    Dispense:  180 tablet    Refill:  [place], MD [place]

## 2016-06-08 NOTE — Assessment & Plan Note (Signed)
Still not at goal. We will increase glipizide to 5 mg twice daily. Check a CMP. Follow-up in 2 months.

## 2016-06-08 NOTE — Assessment & Plan Note (Signed)
Minimally elevated previously. We will check a CMP today.

## 2016-06-08 NOTE — Patient Instructions (Signed)
We are going to increase your glipizide to 5 mg twice daily. Please continue to monitor your blood sugars. Please set up a visit with an ophthalmologist. Burnis Medin get some lab work and contact you with results.

## 2016-07-16 ENCOUNTER — Telehealth: Payer: Self-pay | Admitting: Family Medicine

## 2016-07-16 NOTE — Telephone Encounter (Signed)
Tried to reach patient and close some Quality Metric Gaps before appointment,  Last DM eye exam , and PHQ2 screening,  Gaps for PCP  A1c goal not met last A1c 8.0  Last reading 2/18, Documenting Obesity Mgmt. FYI has upcoming appointment 08/21/16. Patient has total of 4 Gaps. FYI

## 2016-07-27 ENCOUNTER — Telehealth: Payer: Self-pay | Admitting: Family Medicine

## 2016-07-27 MED ORDER — SERTRALINE HCL 50 MG PO TABS: ORAL_TABLET | ORAL | 1 refills | Status: DC

## 2016-07-27 NOTE — Telephone Encounter (Signed)
Sent to pharmacy 

## 2016-07-27 NOTE — Telephone Encounter (Signed)
Pt called requesting a refill on his sertraline (ZOLOFT) 50 MG tablet. Please advise, thank you!  [place], [place]  Call pt @ [phone]

## 2016-08-21 ENCOUNTER — Encounter: Payer: Self-pay | Admitting: Family Medicine

## 2016-08-21 ENCOUNTER — Ambulatory Visit (INDEPENDENT_AMBULATORY_CARE_PROVIDER_SITE_OTHER): Payer: Medicare Other | Admitting: Family Medicine

## 2016-08-21 VITALS — BP 110/78 | HR 81 | Temp 98.6°F | Wt 239.0 lb

## 2016-08-21 DIAGNOSIS — E119 Type 2 diabetes mellitus without complications: Secondary | ICD-10-CM | POA: Diagnosis not present

## 2016-08-21 DIAGNOSIS — F419 Anxiety disorder, unspecified: Secondary | ICD-10-CM | POA: Diagnosis not present

## 2016-08-21 DIAGNOSIS — F329 Major depressive disorder, single episode, unspecified: Secondary | ICD-10-CM

## 2016-08-21 DIAGNOSIS — E7800 Pure hypercholesterolemia, unspecified: Secondary | ICD-10-CM

## 2016-08-21 DIAGNOSIS — F32A Depression, unspecified: Secondary | ICD-10-CM

## 2016-08-21 LAB — COMPREHENSIVE METABOLIC PANEL
ALBUMIN: 4.4 g/dL (ref 3.5–5.2)
ALK PHOS: 106 U/L (ref 39–117)
ALT: 42 U/L (ref 0–53)
AST: 19 U/L (ref 0–37)
BUN: 15 mg/dL (ref 6–23)
CALCIUM: 9.7 mg/dL (ref 8.4–10.5)
CO2: 28 mEq/L (ref 19–32)
Chloride: 101 mEq/L (ref 96–112)
Creatinine, Ser: 1.05 mg/dL (ref 0.40–1.50)
GFR: 80.48 mL/min (ref >60.00)
Glucose, Bld: 127 mg/dL — ABNORMAL HIGH (ref 70–99)
POTASSIUM: 4 meq/L (ref 3.5–5.1)
Sodium: 137 mEq/L (ref 135–145)
TOTAL PROTEIN: 7.5 g/dL (ref 6.0–8.3)
Total Bilirubin: 0.5 mg/dL (ref 0.2–1.2)

## 2016-08-21 LAB — HEMOGLOBIN A1C: HEMOGLOBIN A1C: 6.9 % — AB (ref 4.6–6.5)

## 2016-08-21 NOTE — Patient Instructions (Signed)
Nice to see you. We will check lab work and contact you with the results. Please monitor for recurrence of the yellow color of your eyes.

## 2016-08-21 NOTE — Assessment & Plan Note (Signed)
Improved.  Continue Zoloft. 

## 2016-08-21 NOTE — Progress Notes (Signed)
  [name], MD Phone: [phone]  Brad Encinas. is a 46 y.o. male who presents today for f/u.  DIABETES Disease Monitoring: Blood Sugar ranges-130s Polyuria/phagia/dipsia- no      ophthalmology-has not seen Medications: Compliance- taking glipizide Hypoglycemic symptoms- no  Hyperlipidemia: Taking Lipitor. No chest pain, right upper quadrant pain, or myalgias.  Anxiety/depression: Notes no depression. Anxiety is better. No SI or HI. Is taking Zoloft.  Patient reports his wife thought his eyes looked a little yellow for a couple of days some time ago. No jaundice of his skin. He was not sick. He felt well. Resolved on its own.   PMH: Former smoker   ROS see history of present illness  Objective  Physical Exam Vitals:   08/21/16 1454  BP: 110/78  Pulse: 81  Temp: 98.6 F (37 C)    BP Readings from Last 3 Encounters:  08/21/16 110/78  06/08/16 126/88  05/08/16 120/82   Wt Readings from Last 3 Encounters:  08/21/16 239 lb (108.4 kg)  06/08/16 240 lb 6.4 oz (109 kg)  05/08/16 233 lb 3.2 oz (105.8 kg)    Physical Exam  Constitutional: No distress.  HENT:  Head: Normocephalic and atraumatic.  Eyes: Conjunctivae are normal. Pupils are equal, round, and reactive to light. No scleral icterus.  Cardiovascular: Normal rate, regular rhythm and normal heart sounds.   Pulmonary/Chest: Effort normal and breath sounds normal.  Abdominal: Soft. Bowel sounds are normal. He exhibits no distension. There is no tenderness. There is no rebound and no guarding.  Skin: He is not diaphoretic.     Assessment/Plan: Please see individual problem list.  Diabetes ([place]) Improved control. Check A1c. Continue glipizide.  Anxiety and depression Improved. Continue Zoloft.  Elevated LDL cholesterol level Continue Lipitor. We'll check a CMP given the possible scleral icterus he had. Potentially could've been related to some underlying viral illness. Benign abdominal exam.  We'll monitor for recurrence.   Orders Placed This Encounter  Procedures  . Comp Met (CMET)  . HgB A1c    [name], MD [place]

## 2016-08-21 NOTE — Assessment & Plan Note (Signed)
Continue Lipitor. We'll check a CMP given the possible scleral icterus he had. Potentially could've been related to some underlying viral illness. Benign abdominal exam. We'll monitor for recurrence.

## 2016-08-21 NOTE — Assessment & Plan Note (Signed)
Improved control. Check A1c. Continue glipizide.

## 2016-09-14 ENCOUNTER — Ambulatory Visit (INDEPENDENT_AMBULATORY_CARE_PROVIDER_SITE_OTHER): Payer: Medicare Other

## 2016-09-14 VITALS — BP 120/80 | HR 75 | Temp 98.3°F | Resp 14 | Ht 68.0 in

## 2016-09-14 DIAGNOSIS — Z0000 Encounter for general adult medical examination without abnormal findings: Secondary | ICD-10-CM | POA: Diagnosis not present

## 2016-09-14 DIAGNOSIS — Z114 Encounter for screening for human immunodeficiency virus [HIV]: Secondary | ICD-10-CM

## 2016-09-14 NOTE — Progress Notes (Signed)
Subjective:   Brad Jimenez. is a 46 y.o. male who presents for an Initial Medicare Annual Wellness Visit.  Review of Systems  No ROS.  Medicare Wellness Visit. Additional risk factors are reflected in the social history. Cardiac Risk Factors include: advanced age (>92mn, >>75women);male gender;diabetes mellitus    Objective:    Today's Vitals   09/14/16 1422  BP: 120/80  Pulse: 75  Resp: 14  Temp: 98.3 F (36.8 C)  TempSrc: Oral  SpO2: 97%  Height: 5' 8"  (1.727 m)   There is no height or weight on file to calculate BMI.  Current Medications (verified) Outpatient Encounter Prescriptions as of 09/14/2016  Medication Sig  . atorvastatin (LIPITOR) 80 MG tablet Take 1 tablet (80 mg total) by mouth daily.  . Blood Glucose Monitoring Suppl (RELION CONFIRM GLUCOSE MONITOR) W/DEVICE KIT 1 Device by Does not apply route 2 (two) times daily.  .Marland KitchenglipiZIDE (GLUCOTROL) 5 MG tablet Take 1 tablet (5 mg total) by mouth 2 (two) times daily before a meal.  . glucose blood (RELION CONFIRM/MICRO TEST) test strip Use as instructed  . RELION LANCETS STANDARD 21G MISC 1 Device by Does not apply route 2 (two) times daily.  . sertraline (ZOLOFT) 50 MG tablet TAKE ONE (1) TABLET BY MOUTH EVERY DAY   No facility-administered encounter medications on file as of 09/14/2016.     Allergies (verified) Fentanyl   History: Past Medical History:  Diagnosis Date  . Anxiety   . Arthritis   . Asthma   . Chickenpox   . COPD (chronic obstructive pulmonary disease) ([place]   . Depression   . Diabetes [place]   . History of blood transfusion   . Low back pain    Past Surgical History:  Procedure Laterality Date  . APPENDECTOMY  2002  . BACK SURGERY  2001, 2002  . CHOLECYSTECTOMY  2012   Family History  Problem Relation Age of Onset  . Arthritis Mother   . Lung cancer Mother   . Heart disease Mother   . Hypertension Mother   . Diabetes Mother   . Cancer Mother        Lung  . Diabetes Sister    . Tongue cancer Sister   . Alcoholism Father   . Stroke Father    Social History   Occupational History  . Not on file.   Social History Main Topics  . Smoking status: Former Smoker    Packs/day: 1.50    Years: 22.00    Types: Cigarettes    Quit date: 02/20/2011  . Smokeless tobacco: Never Used  . Alcohol use 1.2 oz/week    2 Cans of beer per week     Comment: occasional  . Drug use: No  . Sexual activity: Yes   Tobacco Counseling Counseling given: Not Answered   Activities of Daily Living In your present state of health, do you have any difficulty performing the following activities: 09/14/2016  Hearing? N  Vision? N  Difficulty concentrating or making decisions? N  Walking or climbing stairs? Y  Dressing or bathing? N  Doing errands, shopping? N  Preparing Food and eating ? N  Using the Toilet? N  In the past six months, have you accidently leaked urine? N  Do you have problems with loss of bowel control? N  Managing your Medications? N  Managing your Finances? N  Housekeeping or managing your Housekeeping? N  Some recent data might be hidden  Immunizations and Health Maintenance Immunization History  Administered Date(s) Administered  . Pneumococcal Polysaccharide-23 06/07/2015  . Tdap 06/07/2015   Health Maintenance Due  Topic Date Due  . HIV Screening  09/07/1984  . OPHTHALMOLOGY EXAM  03/02/2016    Patient Care Team: Brad Haven, MD as PCP - General (Family Medicine)  Indicate any recent Medical Services you may have received from other than [facility] providers in the past year (date may be approximate).    Assessment:   This is a routine wellness examination for Brad Jimenez. The goal of the wellness visit is to assist the patient how to close the gaps in care and create a preventative care plan for the patient.   The roster of all physicians providing medical care to patient is listed in the Snapshot section of the chart.  Osteoporosis risk  reviewed.    Safety issues reviewed; Smoke and carbon monoxide detectors in the home. No firearms in the home. Wears seatbelts when driving or riding with others. Patient does wear sunscreen or protective clothing when in direct sunlight. No violence in the home.  Depression- PHQ 2 &9 complete.  No signs/symptoms or verbal communication regarding little pleasure in doing things, feeling down, depressed or hopeless. No changes in sleeping, energy, eating, concentrating.  No thoughts of self harm or harm towards others.    Patient is alert, normal appearance, oriented to person/place/and time.  Correctly identified the president of the [place], recall of 3/3 words, and performing simple calculations. Displays appropriate judgement and can read correct time from watch face.   No new identified risk were noted.  No failures at ADL's or IADL's.   BMI- discussed the importance of a healthy diet, water intake and the benefits of aerobic exercise. Educational material provided.   Daily fluid intake: no caffeine.  4-6 cups of water.  Sleep patterns- Sleeps 10 hours at night.  Wakes feeling rested  HIV Screening discussed. Educational material provided.  Patient Concerns: None at this time. Follow up with PCP as needed.  Hearing/Vision screen Hearing Screening Comments: Patient is able to hear conversational tones without difficulty.  No issues reported.   Vision Screening Comments: Wears corrective lenses when using computer Upcoming appointment scheduled for exam Visual acuity not assessed per patient preference since he plans to be seen by [facility].   Dietary issues and exercise activities discussed: Current Exercise Habits: Home exercise routine, Type of exercise: walking, Time (Minutes): 30, Frequency (Times/Week): 2, Weekly Exercise (Minutes/Week): 60, Intensity: Mild  Goals    . Increase lean proteins          Low carb foods    . Increase physical activity       Depression Screen PHQ 2/9 Scores 09/14/2016 04/13/2015 04/07/2015  PHQ - 2 Score 0 0 0  PHQ- 9 Score - 0 -    Fall Risk Fall Risk  09/14/2016 05/16/2015 05/10/2015 04/07/2015  Falls in the past year? No No No No    Cognitive Function: MMSE - Mini Mental State Exam 09/14/2016  Orientation to time 5  Orientation to Place 5  Registration 3  Attention/ Calculation 5  Recall 3  Language- name 2 objects 2  Language- repeat 1  Language- follow 3 step command 3  Language- read & follow direction 1  Write a sentence 1  Copy design 1  Total score 30        Screening Tests Health Maintenance  Topic Date Due  . HIV Screening  09/07/1984  .  OPHTHALMOLOGY EXAM  03/02/2016  . INFLUENZA VACCINE  05/08/2017 (Originally 10/31/2016)  . HEMOGLOBIN A1C  02/21/2017  . FOOT EXAM  05/08/2017  . URINE MICROALBUMIN  06/08/2017  . PNEUMOCOCCAL POLYSACCHARIDE VACCINE (2) 06/06/2020  . TETANUS/TDAP  06/06/2025        Plan:   End of life planning; Advanced aging; Advanced directives discussed.  No HCPOA/Living Will.  Additional information provided to help them start the conversation with family.  Copy of HCPOA/Living Will requested upon completion. Time spent on this topic is 25 minutes.  I have personally reviewed and noted the following in the patient's chart:   . Medical and social history . Use of alcohol, tobacco or illicit drugs  . Current medications and supplements . Functional ability and status . Nutritional status . Physical activity . Advanced directives . List of other physicians . Hospitalizations, surgeries, and ER visits in previous 12 months . Vitals . Screenings to include cognitive, depression, and falls . Referrals and appointments  In addition, I have reviewed and discussed with patient certain preventive protocols, quality metrics, and best practice recommendations. A written personalized care plan for preventive services as well as general preventive health  recommendations were provided to patient.     Varney Biles, LPN   [id number]

## 2016-09-14 NOTE — Patient Instructions (Addendum)
  [name] , Thank you for taking time to come for your Medicare Wellness Visit. I appreciate your ongoing commitment to your health goals. Please review the following plan we discussed and let me know if I can assist you in the future.   Follow up with Dr. Caryl Bis as needed.    Bring a copy of your [place] and/or Living Will to be scanned into chart once completed.  Have a great day!  These are the goals we discussed: Goals    . Increase lean proteins          Low carb foods    . Increase physical activity       This is a list of the screening recommended for you and due dates:  Health Maintenance  Topic Date Due  . HIV Screening  09/07/1984  . Eye exam for diabetics  03/02/2016  . Flu Shot  05/08/2017*  . Hemoglobin A1C  02/21/2017  . Complete foot exam   05/08/2017  . Urine Protein Check  06/08/2017  . Pneumococcal vaccine (2) 06/06/2020  . Tetanus Vaccine  06/06/2025  *Topic was postponed. The date shown is not the original due date.

## 2016-11-06 ENCOUNTER — Telehealth: Payer: Self-pay | Admitting: Family Medicine

## 2016-11-06 MED ORDER — GLIPIZIDE 5 MG PO TABS: 5.0000 mg | ORAL_TABLET | Freq: Two times a day (BID) | ORAL | 3 refills | Status: DC

## 2016-11-06 NOTE — Telephone Encounter (Signed)
Pt called requesting a refill on his glipiZIDE (GLUCOTROL) 5 MG tablet. Pt states that he is completely out. Please advise, thank you!  [place], [place]  Call pt @ [phone]

## 2016-11-06 NOTE — Telephone Encounter (Signed)
Sent to pharmacy 

## 2017-01-23 ENCOUNTER — Ambulatory Visit: Payer: Medicare Other | Admitting: Family Medicine

## 2017-02-11 ENCOUNTER — Ambulatory Visit: Payer: Self-pay | Admitting: *Deleted

## 2017-02-11 NOTE — Telephone Encounter (Signed)
Patient states he is not having any symptoms, informed patient he could see another provider for eval and vaccine since Dr.Sonneneberg is out of the office. Patient states he will go to the health department to get this done. Patient also states he will wait for the A1C until his follow up appointment.

## 2017-02-11 NOTE — Telephone Encounter (Signed)
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 He wants to know what he needs to do. His  2 week period is approaching and he may need the vaccine.  He would like to come in today to get that if that would be appropriate. He also wants to know if he needs labs for that exposure as well. He wants to make sure he is clear- he has been intimant with his wife. If labs are drawn- he needs A1C drawn for his visit 04/24/2017.  Please call to let him know.

## 2017-02-11 NOTE — Telephone Encounter (Signed)
Copied from [place] #[id number]. Topic: Inquiry >> Feb 11, 2017  9:22 AM Chauncey Mann A wrote: Reason for CRM: Pt. Wife calling stating pt. Needs to come in to get his A1C checked. No orders for A1C. Please advise if orders are needed to schedule.

## 2017-02-12 DIAGNOSIS — Z23 Encounter for immunization: Secondary | ICD-10-CM | POA: Diagnosis not present

## 2017-02-14 ENCOUNTER — Other Ambulatory Visit: Payer: Self-pay

## 2017-02-14 MED ORDER — GLIPIZIDE 5 MG PO TABS: 5.0000 mg | ORAL_TABLET | Freq: Two times a day (BID) | ORAL | 3 refills | Status: DC

## 2017-02-14 MED ORDER — ATORVASTATIN CALCIUM 80 MG PO TABS: 80.0000 mg | ORAL_TABLET | Freq: Every day | ORAL | 3 refills | Status: DC

## 2017-02-14 MED ORDER — SERTRALINE HCL 50 MG PO TABS: ORAL_TABLET | ORAL | 1 refills | Status: DC

## 2017-02-18 LAB — HM DIABETES EYE EXAM

## 2017-02-20 ENCOUNTER — Encounter: Payer: Self-pay | Admitting: Family Medicine

## 2017-03-05 ENCOUNTER — Telehealth: Payer: Self-pay | Admitting: Family Medicine

## 2017-03-05 NOTE — Telephone Encounter (Signed)
In red folder to sign

## 2017-03-05 NOTE — Telephone Encounter (Signed)
Pt dropped off a handicap form to be filled out and a life insurance form to be filled out.  Placed in Dr.Sonnenberg color folder upfront

## 2017-03-11 NOTE — Telephone Encounter (Signed)
I have reviewed the patient's disability forms.  There appears to be a page missing.  I cannot sign this until I can visualize this page.  Please see what reason he needs a handicap placard for as well.  Patient also needs to be set up for follow-up.  Thanks.

## 2017-03-13 ENCOUNTER — Telehealth: Payer: Self-pay | Admitting: Family Medicine

## 2017-03-13 NOTE — Telephone Encounter (Signed)
Left message to return call to ask patient questions below, ok for pec to ask patient questions

## 2017-03-13 NOTE — Telephone Encounter (Signed)
Pt called wanting to retrieve message from [place] per  Dr Caryl Bis:  DR Caryl Bis would like the following information:  When does the he has back pain? He has back pain daily   What specifically causes his back to hurt? Does it bother him when he is sitting or just when moving?  Walking, and standing for long periods of time  What limitations does he have related to his back: he can not tie his shoes, do his yard work, and he is unable to lift anything over 5-10 pounds  Has he previously done PT for his back? NO  Explained to pt that once Dr Caryl Bis gets this information he can complete the form; pt verbalizes understanding; pt can be reached at [phone]; will route to [facility] pool for further disposition.

## 2017-03-13 NOTE — Telephone Encounter (Signed)
Patient states he has had a handicap placard since his back surgery, he is unable to walk more then 200 ft without stopping and does wear a brace at times. Form are attached

## 2017-03-13 NOTE — Telephone Encounter (Signed)
Noted. Please contact the patient and follow up on a few questions. Please see when he has back pain. What specifically causes his back to hurt? Does it bother him when he is sitting or just when moving? What limitations does he have related to this his back? Has he previously done PT for his back? Once I know these answers I can complete the form. Thanks.

## 2017-03-13 NOTE — Telephone Encounter (Signed)
Please advise 

## 2017-03-17 NOTE — Telephone Encounter (Signed)
The form has been completed. Based on the patients answers he may benefit from PT to help with his back. Based on answers and prior exams he may be able to trial sedentary work and this was reflected in the paper work. Please make paper work available to the patient.

## 2017-03-18 ENCOUNTER — Ambulatory Visit (INDEPENDENT_AMBULATORY_CARE_PROVIDER_SITE_OTHER): Payer: Medicare HMO | Admitting: Pharmacist

## 2017-03-18 ENCOUNTER — Encounter: Payer: Self-pay | Admitting: Pharmacist

## 2017-03-18 VITALS — BP 132/88 | HR 73 | Ht 68.0 in | Wt 238.0 lb

## 2017-03-18 DIAGNOSIS — E119 Type 2 diabetes mellitus without complications: Secondary | ICD-10-CM | POA: Diagnosis not present

## 2017-03-18 MED ORDER — ACCU-CHEK AVIVA DEVI: 0 refills | Status: AC

## 2017-03-18 MED ORDER — GLUCOSE BLOOD VI STRP: ORAL_STRIP | 12 refills | Status: DC

## 2017-03-18 MED ORDER — EMPAGLIFLOZIN 10 MG PO TABS: 10.0000 mg | ORAL_TABLET | Freq: Every day | ORAL | 3 refills | Status: DC

## 2017-03-18 NOTE — Patient Instructions (Signed)
Thanks for coming to see me today.   1. Start taking Jardiance 10 mg once a day. Do not take this is you're sick/dehydrated/throwing up  2. Continue taking your glipizide for now  3. Test your blood sugar 1-2 times a day (once before you eat anything and 2 hours after your biggest meal)   Come back to see me in January. Bring your blood sugar meter with you for this visit.

## 2017-03-18 NOTE — Telephone Encounter (Signed)
See other message

## 2017-03-18 NOTE — Progress Notes (Signed)
    S:     Chief Complaint  Patient presents with  . Medication Management    Diabetes    Patient arrives in good spirits, ambulating without assistance.  Presents for diabetes evaluation, education, and management at the request of Dr. Caryl Bis. Patient was referred on 03/13/2017.  Patient was last seen by Primary Care Provider on 08/21/2016.  Patient reports Diabetes was diagnosed in 2016. He states recently his CBGs have been "out of control" and he reports being hungry all the time, has tried not to increase food intake. Endorses significant family stress.   Family/Social History: Daughter and her family recently moved in with him which causes stress. Mother and sister with DM (sister on insulin) .   Insurance coverage/medication affordability: Just got [employer] medicare + 75% LIS  Patient reports adherence with medications.  Current diabetes medications include: glipizide 5 mg BID  Patient denies hypoglycemic events.  Patient reported exercise habits: walking 3 times a week x30 mins   Patient denies nocturia.  Patient denies pain/burning on urination.  Patient denies neuropathy. Patient denies visual changes. Patient reports self foot exams.    O:  Physical Exam  Constitutional: He appears well-developed and well-nourished.     Review of Systems  All other systems reviewed and are negative.   Last GFR 08/21/2016 80 ml/min  Lab Results  Component Value Date   HGBA1C 6.9 (H) 08/21/2016   Vitals:   03/18/17 1638  BP: 132/88  Pulse: 73   Home fasting CBG: 200s-220s  A/P: #Diabetes longstanding currently uncontrolled per CBGs . Patient denies hypoglycemic events and is able to verbalize appropriate hypoglycemia management plan. Patient reports adherence with medication. Control is suboptimal due to stress and increased PO intake. Patient now with insurance and medication affordability is not an issue at this time. - Start Jardiance 10 mg once daily. Patient  educated on purpose, proper use and potential adverse effects of Jardiance. Counseled on sick day rules.  Following instruction patient verbalized understanding of treatment plan.  - Continue glipizide 5 mg BID with food for now. - Next A1C anticipated at next visit.    Written patient instructions provided.  Total time in face to face counseling 40 minutes.    Follow up in Pharmacist Clinic Visit Jan 2019.  Carlean Jews, Pharm.D. PGY2 Ambulatory Care Pharmacy Resident Phone: [phone]

## 2017-03-18 NOTE — Telephone Encounter (Signed)
Patient states he is ok with doing PT if insurance pays for it. Patient states his blood sugar fasting has been in the 220s, spoke with Chrys Racer and she states she can see patient today at 4pm. Scheduled patient for visit with pharmacist. Forms faxed and at front for pick up, patient notiifed

## 2017-03-18 NOTE — Telephone Encounter (Signed)
Please advise 

## 2017-03-18 NOTE — Telephone Encounter (Signed)
I agree with seeing Brad Jimenez.

## 2017-03-19 NOTE — Assessment & Plan Note (Signed)
#  Diabetes longstanding currently uncontrolled per CBGs . Patient denies hypoglycemic events and is able to verbalize appropriate hypoglycemia management plan. Patient reports adherence with medication. Control is suboptimal due to stress and increased PO intake. Patient now with insurance and medication affordability is not an issue at this time. - Start Jardiance 10 mg once daily. Patient educated on purpose, proper use and potential adverse effects of Jardiance. Counseled on sick day rules.  Following instruction patient verbalized understanding of treatment plan.  - Continue glipizide 5 mg BID with food for now. - Next A1C anticipated at next visit.

## 2017-03-20 NOTE — Progress Notes (Signed)
I have reviewed the above note and agree. I was available to the pharmacist for consultation.  Linday Rhodes, MD  

## 2017-04-08 ENCOUNTER — Telehealth: Payer: Self-pay | Admitting: Pharmacist

## 2017-04-08 DIAGNOSIS — H40001 Preglaucoma, unspecified, right eye: Secondary | ICD-10-CM | POA: Diagnosis not present

## 2017-04-08 NOTE — Telephone Encounter (Signed)
Patient called to report that Vania Rea was unaffordable in December and now that it's January, he does not have the money to pay for it while he is still working towards his deductible. Provided patient with sample for Jardiance 10 mg tablets. Will follow up tolerance next week at our scheduled appt.   Medication Samples have been provided to the patient.  Drug name: Jardiance      Strength: 10 mg        Qty: 7 tabs  LOT: [id number]  Exp.Date: 01/2019  Dosing instructions: Take 1 tablet by mouth daily  The patient has been instructed regarding the correct time, dose, and frequency of taking this medication, including desired effects and most common side effects.   Johny Drilling Welles 1:30 PM 04/08/2017

## 2017-04-14 NOTE — Progress Notes (Signed)
S:     Chief Complaint  Patient presents with  . Medication Management    Diabetes    Patient arrives in good spirits.  Presents for diabetes evaluation, education, and management at the request of Dr. Caryl Bis (referred on 03/13/2017). Last seen by primary care provider on 08/21/2016. Last Rx Clinic visit on 03/18/2017 - at that time jardiance was started.   Today, patient forgets blood glucose readings, but reports tolerating Jardiance well. Reports adherence to medications and improvements in diet and exercise. Reports he has an old supply of Jardiance 25 mg tablets x ~6 months.   Patient reports Diabetes was diagnosed in November 2017.   Family/Social History: Mother and sister with DM  Insurance coverage/medication affordability: [employer] medicare + 75% LIS  Patient reports adherence with medications.  Current diabetes medications include: Jardiance 10 mg daily, glipizide 5 mg BID  Patient denies hypoglycemic events.  Patient reported dietary habits: Eats 2 meals/day Breakfast:2 sausage patties one piece of white bread  Lunch 3-4pm: grilled chicken sandwich and fries  Dinner 10pm:tacos, pasta, longhorn  Snacks: (as third meal) block cheese, 4-5 ritz crackers, occasional sweet  Drinks:unsweet tea with sweet and low   Patient reported exercise habits: started walking 3X/week 15 minutes or one mile    Patient denies nocturia.  Patient denies pain/burning on urination.  Patient reports neuropathy- complicated by concomitant orthopedic surguries on back (per patient) Patient reports visual changes- recent blurry vision also recent visit to opthalmologist last week. Patient reports self foot exams.   O:  Physical Exam  Constitutional: He appears well-developed and well-nourished.     Review of Systems  Eyes: Positive for blurred vision.  All other systems reviewed and are negative.    Lab Results  Component Value Date   HGBA1C 6.9 (H) 08/21/2016   Vitals:   04/15/17 1403  BP: (!) 148/88  Pulse: 66    Lipid Panel     Component Value Date/Time   CHOL 140 05/08/2016 1046   TRIG 186.0 (H) 05/08/2016 1046   HDL 37.60 (L) 05/08/2016 1046   CHOLHDL 4 05/08/2016 1046   VLDL 37.2 05/08/2016 1046   LDLCALC 65 05/08/2016 1046   LDLDIRECT 84.0 09/01/2015 1033   Last GFR 08/21/2016 80 ml/min  Patient self reports the following readings: Home fasting CBG: 140-150s since starting Jardiance 2 hour post-prandial/random CBG: 200-250s.  A/P: #Diabetes newly diagnosed in 2017 currently uncontrolled given CBGs. Patient denies hypoglycemic events and is able to verbalize appropriate hypoglycemia management plan. Patient reports adherence with medication. Control suboptimal 2/2 dietary indiscretion and sedentary lifestyle, pancreatic insufficiency.  - Increase Jardiance 25mg  daily.  - Continue glipizide 5 mg BID - Counseled on sick day rules for Jardiance - Continue blood sugar checking 1-2 times/ day.   - Next A1C anticipated in a few weeks.  Did not draw at this visit as both patient and myself are aware it will be high and patient is actively implementing therapeutic changes and lifestyle changes to improve CBGs.   #ASCVD risk - primary prevention in patient aged 48-75 with history of DM, moderate intensity statin indicated, tolerating high intensity statin well. LDL well controlled at 65 mg/dL as of 05/08/16. -Continue atorvastatin 80 mg tablet.   Written patient instructions provided .  Total time in face to face counseling 30 minutes.    Follow up in Pharmacist Clinic Visit in 6 weeks .   Patient seen with Felicity Pellegrini PharmD Candidate [address] [place], [place].D., BCPS,  CPP PGY2 Ambulatory Care Pharmacy Resident Phone: [phone]

## 2017-04-15 ENCOUNTER — Ambulatory Visit (INDEPENDENT_AMBULATORY_CARE_PROVIDER_SITE_OTHER): Payer: Medicare HMO | Admitting: Pharmacist

## 2017-04-15 ENCOUNTER — Encounter: Payer: Self-pay | Admitting: Pharmacist

## 2017-04-15 DIAGNOSIS — E119 Type 2 diabetes mellitus without complications: Secondary | ICD-10-CM | POA: Diagnosis not present

## 2017-04-15 MED ORDER — EMPAGLIFLOZIN 25 MG PO TABS: 25.0000 mg | ORAL_TABLET | Freq: Every day | ORAL | 3 refills | Status: DC

## 2017-04-15 NOTE — Patient Instructions (Signed)
Thank you for coming to see [place]!   1. Increase Jardiance to 25 mg daily. DO NOT TAKE THIS MEDICATION if you are sick, throwing up, dehydrated  2. Continue Glipizide 5 mg twice daily for now  We will check your A1C in the next few weeks, I think it will still be high because this is an average over the last three months.

## 2017-04-15 NOTE — Assessment & Plan Note (Signed)
#  Diabetes newly diagnosed in 2017 currently uncontrolled given CBGs. Patient denies hypoglycemic events and is able to verbalize appropriate hypoglycemia management plan. Patient reports adherence with medication. Control suboptimal 2/2 dietary indiscretion and sedentary lifestyle, pancreatic insufficiency.  - Increase Jardiance 25mg  daily.  - Continue glipizide 5 mg BID - Counseled on sick day rules for Jardiance - Continue blood sugar checking 1-2 times/ day.   - Next A1C anticipated in a few weeks.  Did not draw at this visit as both patient and myself are aware it will be high and patient is actively implementing therapeutic changes and lifestyle changes to improve CBGs.   #ASCVD risk - primary prevention in patient aged 56-75 with history of DM, moderate intensity statin indicated, tolerating high intensity statin well. LDL well controlled at 65 mg/dL as of 05/08/16. -Continue atorvastatin 80 mg tablet.

## 2017-04-18 NOTE — Progress Notes (Signed)
I have reviewed the above note and agree. I was available to the pharmacist for consultation.  Naliya Gish, MD  

## 2017-04-24 ENCOUNTER — Other Ambulatory Visit: Payer: Self-pay

## 2017-04-24 ENCOUNTER — Ambulatory Visit (INDEPENDENT_AMBULATORY_CARE_PROVIDER_SITE_OTHER): Payer: Medicare HMO | Admitting: Family Medicine

## 2017-04-24 ENCOUNTER — Encounter: Payer: Self-pay | Admitting: Family Medicine

## 2017-04-24 VITALS — BP 120/82 | HR 77 | Temp 97.9°F | Wt 232.8 lb

## 2017-04-24 DIAGNOSIS — F419 Anxiety disorder, unspecified: Secondary | ICD-10-CM

## 2017-04-24 DIAGNOSIS — B349 Viral infection, unspecified: Secondary | ICD-10-CM | POA: Diagnosis not present

## 2017-04-24 DIAGNOSIS — F329 Major depressive disorder, single episode, unspecified: Secondary | ICD-10-CM

## 2017-04-24 DIAGNOSIS — E7800 Pure hypercholesterolemia, unspecified: Secondary | ICD-10-CM | POA: Diagnosis not present

## 2017-04-24 DIAGNOSIS — E119 Type 2 diabetes mellitus without complications: Secondary | ICD-10-CM | POA: Diagnosis not present

## 2017-04-24 DIAGNOSIS — F32A Depression, unspecified: Secondary | ICD-10-CM

## 2017-04-24 NOTE — Patient Instructions (Signed)
Nice to see you. Please continue with your current regimen.  We will get you set up for an A1c in a month.

## 2017-04-24 NOTE — Assessment & Plan Note (Signed)
-  Continue Lipitor °

## 2017-04-24 NOTE — Assessment & Plan Note (Signed)
Seems to have had a viral illness previously.  He has recovered.  He will monitor.

## 2017-04-24 NOTE — Progress Notes (Signed)
  Brad Rumps, MD Phone: [phone]  Brad Jimenez. is a 47 y.o. male who presents today for follow-up.  Diabetes: Typically in the 130s fasting and 160s 2-hour postprandial.  Taking glipizide once daily and Jardiance.  Does note some polyuria with the Jardiance.  No hypoglycemia.  His vision has improved from prior when it was blurry with high sugars.  He has seen ophthalmology.  He started to exercise twice a week with walking.  Hyperlipidemia: Taking Lipitor.  No chest pain, myalgias, or right upper quadrant pain.  Anxiety/depression: Notes no depression though does note that his mind does race with some anxiety.  No SI.  He is on Zoloft.  He reports last week he had about a 12-hour bug.  He felt achy and feverish overnight and then felt better the next day.  No other symptoms with this.  Social History   Tobacco Use  Smoking Status Former Smoker  . Packs/day: 1.50  . Years: 22.00  . Pack years: 33.00  . Types: Cigarettes  . Last attempt to quit: 02/20/2011  . Years since quitting: 6.1  Smokeless Tobacco Never Used     ROS see history of present illness  Objective  Physical Exam Vitals:   04/24/17 1507  BP: 120/82  Pulse: 77  Temp: 97.9 F (36.6 C)  SpO2: 97%    BP Readings from Last 3 Encounters:  04/24/17 120/82  04/15/17 (!) 148/88  03/18/17 132/88   Wt Readings from Last 3 Encounters:  04/24/17 232 lb 12.8 oz (105.6 kg)  04/15/17 232 lb (105.2 kg)  03/18/17 238 lb (108 kg)    Physical Exam  Constitutional: No distress.  Cardiovascular: Normal rate, regular rhythm and normal heart sounds.  Pulmonary/Chest: Effort normal and breath sounds normal.  Musculoskeletal: He exhibits no edema.  Neurological: He is alert. Gait normal.  Skin: Skin is warm and dry. He is not diaphoretic.     Assessment/Plan: Please see individual problem list.  Diabetes ([place]) Sugars have improved.  Patient wants to hold off on an A1c until he has been on the  medication longer.  He has an appointment with our clinical pharmacist in 1 month and will plan to have an A1c checked then.  He will continue his current regimen.  Anxiety and depression No depression.  Occasional anxiety.  He wants to stay on current dose of Zoloft.  Elevated LDL cholesterol level Continue Lipitor.  Viral illness Seems to have had a viral illness previously.  He has recovered.  He will monitor.   Orders Placed This Encounter  Procedures  . POCT HgB A1C    Standing Status:   Future    Standing Expiration Date:   07/23/2017    No orders of the defined types were placed in this encounter.    Brad Rumps, MD [place]

## 2017-04-24 NOTE — Assessment & Plan Note (Signed)
No depression.  Occasional anxiety.  He wants to stay on current dose of Zoloft.

## 2017-04-24 NOTE — Assessment & Plan Note (Signed)
Sugars have improved.  Patient wants to hold off on an A1c until he has been on the medication longer.  He has an appointment with our clinical pharmacist in 1 month and will plan to have an A1c checked then.  He will continue his current regimen.

## 2017-05-27 ENCOUNTER — Ambulatory Visit: Payer: Medicare HMO | Admitting: Pharmacist

## 2017-06-10 ENCOUNTER — Ambulatory Visit: Payer: Medicare HMO | Admitting: Pharmacist

## 2017-07-01 ENCOUNTER — Ambulatory Visit (INDEPENDENT_AMBULATORY_CARE_PROVIDER_SITE_OTHER): Payer: Medicare HMO | Admitting: Pharmacist

## 2017-07-01 ENCOUNTER — Encounter: Payer: Self-pay | Admitting: Pharmacist

## 2017-07-01 VITALS — BP 124/84 | HR 84 | Wt 233.0 lb

## 2017-07-01 DIAGNOSIS — E119 Type 2 diabetes mellitus without complications: Secondary | ICD-10-CM | POA: Diagnosis not present

## 2017-07-01 LAB — POCT GLYCOSYLATED HEMOGLOBIN (HGB A1C): HEMOGLOBIN A1C: 6.4

## 2017-07-01 NOTE — Progress Notes (Signed)
I have reviewed the above note and agree. I was available to the pharmacist for consultation.  Xsavier Seeley, MD  

## 2017-07-01 NOTE — Progress Notes (Signed)
    S:     Chief Complaint  Patient presents with  . Medication Management    Diabetes     Patient arrives in good spirits.  Presents for diabetes evaluation, education, and management at the request of Dr. Caryl Bis (referred on 03/13/2017). Last seen by primary care provider on 04/24/2017. Last Rx Clinic visit on 04/15/17 - at that time Jardiance was increased.   Patient reports he is doing well. Trying to pay attention to serving sizes but still endorses dietary noncompliance. Reports wife is going for surgery this month.   Insurance coverage/medication affordability: [employer] plan - meds affordable at this time  Patient reports adherence with medications.  Current diabetes medications include: Jardiance 25 mg daily, glipizide 5 mg with supper  Patient denies hypoglycemic events.  Patient reported dietary habits: Eats 2 meals/day Breakfast: steak biscuit Lunch: grilled chicken, mashed potatoes, pinto beans OR hotdogs OR soup Dinner:frozen pizza Snacks:peanuts, 4-pack nabs, cheese, crackers, PB Drinks:unsweet tea, some water, SF twist orangeade   Patient reported exercise habits: none at present, wants to get back to walking   Patient reports nocturia x1 at ~6AM Patient denies pain/burning on urination.  Patient denies neuropathy. Patient denies visual changes. Reports he went to eye doctor early January and he was told he did not have glaucoma.  Patient reports self foot exams. Denies issues.   O:  Physical Exam  Constitutional: He appears well-developed and well-nourished.   Review of Systems  All other systems reviewed and are negative.    Lab Results  Component Value Date   HGBA1C 6.4 07/01/2017   Vitals:   07/01/17 1427  BP: 124/84  Pulse: 84    Lipid Panel     Component Value Date/Time   CHOL 140 05/08/2016 1046   TRIG 186.0 (H) 05/08/2016 1046   HDL 37.60 (L) 05/08/2016 1046   CHOLHDL 4 05/08/2016 1046   VLDL 37.2 05/08/2016 1046   LDLCALC 65  05/08/2016 1046   LDLDIRECT 84.0 09/01/2015 1033    Home fasting CBG: 130s, lowest 101 and highest 150 2 hour post-prandial/random CBG: before supper lowest - 96, highest - 120  A/P: #Diabetes longstanding currently well controlled as evidenced by A1C <7%. Patient denies hypoglycemic events and is able to verbalize appropriate hypoglycemia management plan. Patient reports adherence with medication. - Continue Jardiance 25 mg daily. Counseled on sick day rules for Jardiance.  - Continue glipizide 5 mg with supper. - Next A1C anticipated in 3 months.    #ASCVD risk - primary prevention in patient aged 32-75 with history of DM, moderate intensity statin indicated, tolerating high intensity statin well. LDL well controlled at 65 mg/dL as of 05/08/16. -Continue atorvastatin 80 mg tablet.   Written patient instructions provided.  Total time in face to face counseling 30 minutes.    Follow up in Pharmacist Clinic Visit as needed. Otherwise follow up with Dr. Caryl Bis in May.     Carlean Jews, Pharm.D., BCPS, CPP PGY2 Ambulatory Care Pharmacy Resident Phone: [phone]

## 2017-07-01 NOTE — Assessment & Plan Note (Signed)
#  Diabetes longstanding currently well controlled as evidenced by A1C <7%. Patient denies hypoglycemic events and is able to verbalize appropriate hypoglycemia management plan. Patient reports adherence with medication. - Continue Jardiance 25 mg daily. Counseled on sick day rules for Jardiance.  - Continue glipizide 5 mg with supper. - Next A1C anticipated in 3 months.    #ASCVD risk - primary prevention in patient aged 48-75 with history of DM, moderate intensity statin indicated, tolerating high intensity statin well. LDL well controlled at 65 mg/dL as of 05/08/16. -Continue atorvastatin 80 mg tablet.

## 2017-07-01 NOTE — Patient Instructions (Signed)
Good to see you.   Sounds like your sugars are doing much better. A1C today looked excellent today at 6.4%. Goal is to be <7%.   No need to follow up with me again. Please keep appointment with Dr. Caryl Bis on 5/29. Call me if you need help getting sugars under control again

## 2017-07-08 ENCOUNTER — Other Ambulatory Visit: Payer: Self-pay | Admitting: Family Medicine

## 2017-07-09 ENCOUNTER — Other Ambulatory Visit: Payer: Self-pay

## 2017-07-09 MED ORDER — SERTRALINE HCL 50 MG PO TABS: ORAL_TABLET | ORAL | 1 refills | Status: DC

## 2017-08-28 ENCOUNTER — Encounter: Payer: Self-pay | Admitting: Family Medicine

## 2017-08-28 ENCOUNTER — Ambulatory Visit (INDEPENDENT_AMBULATORY_CARE_PROVIDER_SITE_OTHER): Payer: Medicare HMO | Admitting: Family Medicine

## 2017-08-28 VITALS — BP 112/82 | HR 73 | Temp 97.7°F | Ht 68.0 in | Wt 233.8 lb

## 2017-08-28 DIAGNOSIS — F419 Anxiety disorder, unspecified: Secondary | ICD-10-CM | POA: Diagnosis not present

## 2017-08-28 DIAGNOSIS — E7800 Pure hypercholesterolemia, unspecified: Secondary | ICD-10-CM | POA: Diagnosis not present

## 2017-08-28 DIAGNOSIS — D229 Melanocytic nevi, unspecified: Secondary | ICD-10-CM

## 2017-08-28 DIAGNOSIS — F32A Depression, unspecified: Secondary | ICD-10-CM

## 2017-08-28 DIAGNOSIS — F329 Major depressive disorder, single episode, unspecified: Secondary | ICD-10-CM

## 2017-08-28 DIAGNOSIS — E119 Type 2 diabetes mellitus without complications: Secondary | ICD-10-CM | POA: Diagnosis not present

## 2017-08-28 LAB — MICROALBUMIN / CREATININE URINE RATIO
Creatinine,U: 99.9 mg/dL
MICROALB UR: 0.9 mg/dL (ref 0.0–1.9)
Microalb Creat Ratio: 0.9 mg/g (ref 0.0–30.0)

## 2017-08-28 NOTE — Assessment & Plan Note (Signed)
Suspect benign nevus though given that it is new we will refer to dermatology for evaluation and consideration of removal.

## 2017-08-28 NOTE — Progress Notes (Signed)
  Brad Rumps, MD Phone: [phone]  Brad Jimenez. is a 47 y.o. male who presents today for f/u.  CC: DM, HLD, anxiety/depression  DIABETES Disease Monitoring: Blood Sugar ranges-120s Polyuria/phagia/dipsia- no      Optho- UTD Medications: Compliance- taking jardiance, glipizide Hypoglycemic symptoms- no  HYPERLIPIDEMIA Symptoms Chest pain on exertion:  no    Medications: Compliance- taking lipitor Right upper quadrant pain- no  Muscle aches- no  Anxiety/depression: He notes no symptoms.  He remains on Zoloft.  No SI.  He reports he has a spot on his right temple that has been present for 2 or 3 weeks and he has not noticed it.  He notes no injury to the area.  It does not itch.  No pain.  His wife wanted him to get it looked at.  Social History   Tobacco Use  Smoking Status Former Smoker  . Packs/day: 1.50  . Years: 22.00  . Pack years: 33.00  . Types: Cigarettes  . Last attempt to quit: 02/20/2011  . Years since quitting: 6.5  Smokeless Tobacco Never Used     ROS see history of present illness  Objective  Physical Exam Vitals:   08/28/17 1405  BP: 112/82  Pulse: 73  Temp: 97.7 F (36.5 C)  SpO2: 96%    BP Readings from Last 3 Encounters:  08/28/17 112/82  07/01/17 124/84  04/24/17 120/82   Wt Readings from Last 3 Encounters:  08/28/17 233 lb 12.8 oz (106.1 kg)  07/01/17 233 lb (105.7 kg)  04/24/17 232 lb 12.8 oz (105.6 kg)    Physical Exam  Constitutional: No distress.  Cardiovascular: Normal rate, regular rhythm and normal heart sounds.  Pulmonary/Chest: Effort normal and breath sounds normal.  Musculoskeletal: He exhibits no edema.  Neurological: He is alert.  Skin: Skin is warm and dry. He is not diaphoretic.        Diabetic Foot Exam - Simple   Simple Foot Form Diabetic Foot exam was performed with the following findings:  Yes 08/28/2017  2:25 PM  Visual Inspection See comments:  Yes Sensation Testing Intact to touch and  monofilament testing bilaterally:  Yes Pulse Check Posterior Tibialis and Dorsalis pulse intact bilaterally:  Yes Comments Scattered thickened toenails bilateral feet, no other deformities, ulcerations, or skin breakdown      Assessment/Plan: Please see individual problem list.  Diabetes ([place]) Improved control.  Continue current regimen.  Foot exam completed.  Anxiety and depression Well-controlled.  Continue Zoloft.  Elevated LDL cholesterol level He will return for fasting lipid panel.  Continue Lipitor.  Nevus Suspect benign nevus though given that it is new we will refer to dermatology for evaluation and consideration of removal.   Orders Placed This Encounter  Procedures  . Urine Microalbumin w/creat. ratio  . Comp Met (CMET)    Standing Status:   Future    Standing Expiration Date:   08/29/2018  . Lipid panel    Standing Status:   Future    Standing Expiration Date:   08/29/2018  . Ambulatory referral to Dermatology    Referral Priority:   Routine    Referral Type:   Consultation    Referral Reason:   Specialty Services Required    Requested Specialty:   Dermatology    Number of Visits Requested:   1    No orders of the defined types were placed in this encounter.    Brad Rumps, MD [place]

## 2017-08-28 NOTE — Assessment & Plan Note (Signed)
Well-controlled.  Continue Zoloft. 

## 2017-08-28 NOTE — Patient Instructions (Signed)
Nice to see you. We will have you return for fasting lab work. We will refer you to dermatology.

## 2017-08-28 NOTE — Assessment & Plan Note (Signed)
He will return for fasting lipid panel.  Continue Lipitor.

## 2017-08-28 NOTE — Assessment & Plan Note (Signed)
Improved control.  Continue current regimen.  Foot exam completed.

## 2017-08-30 ENCOUNTER — Other Ambulatory Visit (INDEPENDENT_AMBULATORY_CARE_PROVIDER_SITE_OTHER): Payer: Medicare HMO

## 2017-08-30 DIAGNOSIS — Z114 Encounter for screening for human immunodeficiency virus [HIV]: Secondary | ICD-10-CM

## 2017-08-30 DIAGNOSIS — E7800 Pure hypercholesterolemia, unspecified: Secondary | ICD-10-CM | POA: Diagnosis not present

## 2017-08-30 LAB — COMPREHENSIVE METABOLIC PANEL
ALBUMIN: 4.1 g/dL (ref 3.5–5.2)
ALT: 37 U/L (ref 0–53)
AST: 20 U/L (ref 0–37)
Alkaline Phosphatase: 107 U/L (ref 39–117)
BILIRUBIN TOTAL: 0.4 mg/dL (ref 0.2–1.2)
BUN: 17 mg/dL (ref 6–23)
CALCIUM: 9 mg/dL (ref 8.4–10.5)
CO2: 26 mEq/L (ref 19–32)
Chloride: 103 mEq/L (ref 96–112)
Creatinine, Ser: 0.8 mg/dL (ref 0.40–1.50)
GFR: 109.67 mL/min (ref >60.00)
Glucose, Bld: 126 mg/dL — ABNORMAL HIGH (ref 70–99)
Potassium: 3.8 mEq/L (ref 3.5–5.1)
Sodium: 137 mEq/L (ref 135–145)
TOTAL PROTEIN: 7.2 g/dL (ref 6.0–8.3)

## 2017-08-30 LAB — LIPID PANEL
CHOLESTEROL: 133 mg/dL (ref 0–200)
HDL: 32.7 mg/dL — ABNORMAL LOW (ref >39.00)
NonHDL: 100.02
TRIGLYCERIDES: 225 mg/dL — AB (ref 0.0–149.0)
Total CHOL/HDL Ratio: 4
VLDL: 45 mg/dL — ABNORMAL HIGH (ref 0.0–40.0)

## 2017-08-30 LAB — LDL CHOLESTEROL, DIRECT: Direct LDL: 76 mg/dL

## 2017-08-30 NOTE — Addendum Note (Signed)
Addended by: Arby Barrette on: 08/30/2017 10:36 AM   Modules accepted: Orders

## 2017-08-31 LAB — HIV ANTIBODY (ROUTINE TESTING W REFLEX): HIV 1&2 Ab, 4th Generation: NONREACTIVE

## 2017-09-17 ENCOUNTER — Other Ambulatory Visit: Payer: Self-pay

## 2017-09-17 ENCOUNTER — Ambulatory Visit (INDEPENDENT_AMBULATORY_CARE_PROVIDER_SITE_OTHER): Payer: Medicare HMO

## 2017-09-17 VITALS — BP 106/64 | HR 83 | Temp 98.3°F | Resp 14 | Ht 67.0 in | Wt 235.8 lb

## 2017-09-17 DIAGNOSIS — E119 Type 2 diabetes mellitus without complications: Secondary | ICD-10-CM

## 2017-09-17 DIAGNOSIS — Z0000 Encounter for general adult medical examination without abnormal findings: Secondary | ICD-10-CM

## 2017-09-17 NOTE — Progress Notes (Signed)
Subjective:   Brad Jimenez. is a 47 y.o. male who presents for Medicare Annual/Subsequent preventive examination.  Review of Systems:  No ROS.  Medicare Wellness Visit. Additional risk factors are reflected in the social history.  Cardiac Risk Factors include: male gender;diabetes mellitus     Objective:    Vitals: BP 106/64 (BP Location: Left Arm, Patient Position: Sitting, Cuff Size: Normal)   Pulse 83   Temp 98.3 F (36.8 C) (Oral)   Resp 14   Ht 5\' 7"  (1.702 m)   Wt 235 lb 12.8 oz (107 kg)   SpO2 94%   BMI 36.93 kg/m   Body mass index is 36.93 kg/m.  Advanced Directives 09/17/2017 09/14/2016 04/07/2015 02/20/2015  Does Patient Have a Medical Advance Directive? No No No No  Would patient like information on creating a medical advance directive? Yes (MAU/Ambulatory/Procedural Areas - Information given) Yes (MAU/Ambulatory/Procedural Areas - Information given) No - patient declined information No - patient declined information    Tobacco Social History   Tobacco Use  Smoking Status Former Smoker  . Packs/day: 1.50  . Years: 22.00  . Pack years: 33.00  . Types: Cigarettes  . Last attempt to quit: 02/20/2011  . Years since quitting: 6.5  Smokeless Tobacco Never Used     Counseling given: Not Answered   Clinical Intake:  Pre-visit preparation completed: Yes  Pain : No/denies pain     Nutritional Status: BMI > 30  Obese Diabetes: Yes(Followed by pcp)  How often do you need to have someone help you when you read instructions, pamphlets, or other written materials from your doctor or pharmacy?: 1 - Never  Interpreter Needed?: No     Past Medical History:  Diagnosis Date  . Anxiety   . Arthritis   . Asthma   . Chickenpox   . COPD (chronic obstructive pulmonary disease) ([place])   . Depression   . Diabetes ([place])   . History of blood transfusion   . Low back pain    Past Surgical History:  Procedure Laterality Date  . APPENDECTOMY  2002  . BACK  SURGERY  2001, 2002  . CHOLECYSTECTOMY  2012   Family History  Problem Relation Age of Onset  . Arthritis Mother   . Lung cancer Mother   . Heart disease Mother   . Hypertension Mother   . Diabetes Mother   . Cancer Mother        Lung  . Diabetes Sister   . Tongue cancer Sister   . Alcoholism Father   . Stroke Father    Social History   Socioeconomic History  . Marital status: Married    Spouse name: Not on file  . Number of children: Not on file  . Years of education: Not on file  . Highest education level: Not on file  Occupational History  . Not on file  Social Needs  . Financial resource strain: Not hard at all  . Food insecurity:    Worry: Never true    Inability: Never true  . Transportation needs:    Medical: No    Non-medical: No  Tobacco Use  . Smoking status: Former Smoker    Packs/day: 1.50    Years: 22.00    Pack years: 33.00    Types: Cigarettes    Last attempt to quit: 02/20/2011    Years since quitting: 6.5  . Smokeless tobacco: Never Used  Substance and Sexual Activity  . Alcohol use: Yes  Alcohol/week: 1.2 oz    Types: 2 Cans of beer per week    Comment: occasional  . Drug use: No  . Sexual activity: Yes  Lifestyle  . Physical activity:    Days per week: 3 days    Minutes per session: 20 min  . Stress: Not at all  Relationships  . Social connections:    Talks on phone: Not on file    Gets together: Not on file    Attends religious service: Not on file    Active member of club or organization: Not on file    Attends meetings of clubs or organizations: Not on file    Relationship status: Not on file  Other Topics Concern  . Not on file  Social History Narrative  . Not on file    Outpatient Encounter Medications as of 09/17/2017  Medication Sig  . atorvastatin (LIPITOR) 80 MG tablet Take 1 tablet (80 mg total) daily by mouth.  . Blood Glucose Monitoring Suppl (ACCU-CHEK AVIVA) device Use as instructed  . empagliflozin  (JARDIANCE) 25 MG TABS tablet Take 25 mg by mouth daily.  Marland Kitchen glipiZIDE (GLUCOTROL) 5 MG tablet Take 1 tablet (5 mg total) 2 (two) times daily before a meal by mouth. (Patient taking differently: Take 5 mg by mouth daily with supper. )  . glucose blood (ACCU-CHEK AVIVA) test strip Test blood sugar twice daily.  . Multiple Vitamin (MULTIVITAMIN WITH MINERALS) TABS tablet Take 1 tablet by mouth daily.  . naproxen sodium (ALEVE) 220 MG tablet Take 2 tablets by mouth daily as needed.  Marland Kitchen RELION LANCETS STANDARD 21G MISC 1 Device by Does not apply route 2 (two) times daily.  . sertraline (ZOLOFT) 50 MG tablet TAKE ONE (1) TABLET BY MOUTH EVERY DAY   No facility-administered encounter medications on file as of 09/17/2017.     Activities of Daily Living In your present state of health, do you have any difficulty performing the following activities: 09/17/2017  Hearing? N  Vision? N  Difficulty concentrating or making decisions? N  Walking or climbing stairs? N  Dressing or bathing? N  Doing errands, shopping? N  Preparing Food and eating ? N  Using the Toilet? N  In the past six months, have you accidently leaked urine? N  Do you have problems with loss of bowel control? N  Managing your Medications? N  Managing your Finances? N  Housekeeping or managing your Housekeeping? N  Some recent data might be hidden    Patient Care Team: Leone Haven, MD as PCP - General (Family Medicine)   Assessment:   This is a routine wellness examination for Brad Jimenez.  The goal of the wellness visit is to assist the patient how to close the gaps in care and create a preventative care plan for the patient.   The roster of all physicians providing medical care to patient is listed in the Snapshot section of the chart.  Osteoporosis risk reviewed.    Safety issues reviewed; Smoke and carbon monoxide detectors in the home. No firearms in the home. Wears seatbelts when driving or riding with others. No  violence in the home.  They do not have excessive sun exposure.  Discussed the need for sun protection: hats, long sleeves and the use of sunscreen if there is significant sun exposure.  Patient is alert, normal appearance, oriented to person/place/and time. Correctly identified the president of the [place] and recalls of 3/3 words.Performs simple calculations and can read correct time  from watch face. Displays appropriate judgement.  No new identified risk were noted.  No failures at ADL's or IADL's.    BMI- discussed the importance of a healthy diet, water intake and the benefits of aerobic exercise. Educational material provided.   24 hour diet recall: Regular diet.  He does not monitor portions.  I encouraged low carb diet, monitoring portions.  Dental- every 6 months.  Eye- Visual acuity not assessed per patient preference since they have regular follow up with the ophthalmologist.   Sleep patterns- Sleeps 6-8 hours at night.  Wakes feeling rested.   Health maintenance gaps- closed.  Patient Concerns: None at this time. Follow up with PCP as needed.  Exercise Activities and Dietary recommendations Current Exercise Habits: Home exercise routine, Type of exercise: walking, Time (Minutes): 20, Frequency (Times/Week): 3, Weekly Exercise (Minutes/Week): 60, Intensity: Moderate  Goals    . DIET - INCREASE LEAN PROTEINS     Low carb diet, portion controlled       Fall Risk Fall Risk  09/17/2017 09/14/2016 05/16/2015 05/10/2015 04/07/2015  Falls in the past year? No No No No No   Depression Screen PHQ 2/9 Scores 09/17/2017 09/14/2016 04/13/2015 04/07/2015  PHQ - 2 Score 0 0 0 0  PHQ- 9 Score - - 0 -    Cognitive Function MMSE - Mini Mental State Exam 09/17/2017 09/14/2016  Orientation to time 5 5  Orientation to Place 5 5  Registration 3 3  Attention/ Calculation 5 5  Recall 3 3  Language- name 2 objects 2 2  Language- repeat 1 1  Language- follow 3 step command 3 3  Language- read  & follow direction 1 1  Write a sentence 1 1  Copy design 1 1  Total score 30 30        Immunization History  Administered Date(s) Administered  . Pneumococcal Polysaccharide-23 06/07/2015  . Tdap 06/07/2015   Screening Tests Health Maintenance  Topic Date Due  . INFLUENZA VACCINE  10/31/2017  . HEMOGLOBIN A1C  12/31/2017  . OPHTHALMOLOGY EXAM  02/18/2018  . FOOT EXAM  08/29/2018  . URINE MICROALBUMIN  08/29/2018  . PNEUMOCOCCAL POLYSACCHARIDE VACCINE (2) 06/06/2020  . TETANUS/TDAP  06/06/2025  . HIV Screening  Completed     Plan:    End of life planning; Advance aging; Advanced directives discussed. Copy of current HCPOA/Living Will requested once completed.    I have personally reviewed and noted the following in the patient's chart:   . Medical and social history . Use of alcohol, tobacco or illicit drugs  . Current medications and supplements . Functional ability and status . Nutritional status . Physical activity . Advanced directives . List of other physicians . Hospitalizations, surgeries, and ER visits in previous 12 months . Vitals . Screenings to include cognitive, depression, and falls . Referrals and appointments  In addition, I have reviewed and discussed with patient certain preventive protocols, quality metrics, and best practice recommendations. A written personalized care plan for preventive services as well as general preventive health recommendations were provided to patient.     Varney Biles, LPN  [id number]

## 2017-09-17 NOTE — Patient Instructions (Addendum)
  Mr. Dunshee , Thank you for taking time to come for your Medicare Wellness Visit. I appreciate your ongoing commitment to your health goals. Please review the following plan we discussed and let me know if I can assist you in the future.   Follow up as needed.    Bring a copy of your [place] and/or Living Will to be scanned into chart once completed.   Have a great day!  These are the goals we discussed: Goals    . DIET - INCREASE LEAN PROTEINS     Low carb diet, portion controlled       This is a list of the screening recommended for you and due dates:  Health Maintenance  Topic Date Due  . Flu Shot  10/31/2017  . Hemoglobin A1C  12/31/2017  . Eye exam for diabetics  02/18/2018  . Complete foot exam   08/29/2018  . Urine Protein Check  08/29/2018  . Pneumococcal vaccine (2) 06/06/2020  . Tetanus Vaccine  06/06/2025  . HIV Screening  Completed

## 2017-09-18 MED ORDER — EMPAGLIFLOZIN 25 MG PO TABS: 25.0000 mg | ORAL_TABLET | Freq: Every day | ORAL | 1 refills | Status: DC

## 2017-09-18 NOTE — Telephone Encounter (Signed)
Sent to pharmacy 

## 2017-09-19 DIAGNOSIS — L821 Other seborrheic keratosis: Secondary | ICD-10-CM | POA: Diagnosis not present

## 2017-09-19 DIAGNOSIS — D225 Melanocytic nevi of trunk: Secondary | ICD-10-CM | POA: Diagnosis not present

## 2017-09-19 DIAGNOSIS — L820 Inflamed seborrheic keratosis: Secondary | ICD-10-CM | POA: Diagnosis not present

## 2017-09-19 DIAGNOSIS — L812 Freckles: Secondary | ICD-10-CM | POA: Diagnosis not present

## 2017-09-19 DIAGNOSIS — L905 Scar conditions and fibrosis of skin: Secondary | ICD-10-CM | POA: Diagnosis not present

## 2017-11-22 ENCOUNTER — Other Ambulatory Visit: Payer: Self-pay

## 2017-11-22 MED ORDER — GLIPIZIDE 5 MG PO TABS: 5.0000 mg | ORAL_TABLET | Freq: Every day | ORAL | 1 refills | Status: DC

## 2017-11-22 MED ORDER — ATORVASTATIN CALCIUM 80 MG PO TABS: 80.0000 mg | ORAL_TABLET | Freq: Every day | ORAL | 3 refills | Status: DC

## 2017-11-22 MED ORDER — SERTRALINE HCL 50 MG PO TABS
ORAL_TABLET | ORAL | 1 refills | Status: DC
Start: 2017-11-22 — End: 2018-04-15

## 2017-11-22 NOTE — Addendum Note (Signed)
Addended by: Juanda Chance on: 11/22/2017 08:55 AM   Modules accepted: Orders

## 2017-11-27 ENCOUNTER — Encounter: Payer: Self-pay | Admitting: Family Medicine

## 2017-11-27 ENCOUNTER — Ambulatory Visit (INDEPENDENT_AMBULATORY_CARE_PROVIDER_SITE_OTHER): Payer: Medicare HMO | Admitting: Family Medicine

## 2017-11-27 DIAGNOSIS — D229 Melanocytic nevi, unspecified: Secondary | ICD-10-CM | POA: Diagnosis not present

## 2017-11-27 DIAGNOSIS — R030 Elevated blood-pressure reading, without diagnosis of hypertension: Secondary | ICD-10-CM | POA: Diagnosis not present

## 2017-11-27 DIAGNOSIS — E7800 Pure hypercholesterolemia, unspecified: Secondary | ICD-10-CM | POA: Diagnosis not present

## 2017-11-27 DIAGNOSIS — E119 Type 2 diabetes mellitus without complications: Secondary | ICD-10-CM | POA: Diagnosis not present

## 2017-11-27 LAB — POCT GLYCOSYLATED HEMOGLOBIN (HGB A1C): HEMOGLOBIN A1C: 6.2 % — AB (ref 4.0–5.6)

## 2017-11-27 NOTE — Patient Instructions (Signed)
Nice to see you. We will contact you with your A1c. Please start checking your blood pressure several times a week at home.  If it starts to go up above 130/80 consistently please let us know.

## 2017-11-27 NOTE — Assessment & Plan Note (Signed)
Patient has seen dermatology.

## 2017-11-27 NOTE — Assessment & Plan Note (Signed)
Well-controlled previously.  Continue Lipitor. 

## 2017-11-27 NOTE — Progress Notes (Signed)
  [name], MD Phone: [phone]  Brad Luu. is a 47 y.o. male who presents today for f/u.  CC: hld, dm, nevus  Elevated BP: Noted on initial check today.  He does not check at home.  Has not been elevated in the past.  No chest pain or shortness of breath.  HYPERLIPIDEMIA Symptoms Chest pain on exertion:  no   Medications: Compliance- taking lipitor Right upper quadrant pain- no  Muscle aches- no  DIABETES Disease Monitoring: Blood Sugar ranges-125-130 Polyuria/phagia/dipsia- no      Optho- UTD Medications: Compliance- taking jardiance, glipizide Hypoglycemic symptoms- no  Nevus: Patient saw dermatology and they removed it.  Patient reports that he was advised it was not anything to worry about.  Follow-up as needed.     Social History   Tobacco Use  Smoking Status Former Smoker  . Packs/day: 1.50  . Years: 22.00  . Pack years: 33.00  . Types: Cigarettes  . Last attempt to quit: 02/20/2011  . Years since quitting: 6.7  Smokeless Tobacco Never Used     ROS see history of present illness  Objective  Physical Exam Vitals:   11/27/17 1347 11/27/17 1403  BP: (!) 128/98 124/82  Pulse: 84   Temp: 98 F (36.7 C)   SpO2: 95%     BP Readings from Last 3 Encounters:  11/27/17 124/82  09/17/17 106/64  08/28/17 112/82   Wt Readings from Last 3 Encounters:  11/27/17 237 lb (107.5 kg)  09/17/17 235 lb 12.8 oz (107 kg)  08/28/17 233 lb 12.8 oz (106.1 kg)    Physical Exam  Constitutional: No distress.  Cardiovascular: Normal rate, regular rhythm and normal heart sounds.  Pulmonary/Chest: Effort normal and breath sounds normal.  Musculoskeletal: He exhibits no edema.  Neurological: He is alert.  Skin: Skin is warm and dry. He is not diaphoretic.     Assessment/Plan: Please see individual problem list.  Diabetes ([place]) Check A1c.  Continue current regimen.  Elevated LDL cholesterol level Well-controlled previously.  Continue  Lipitor.  Nevus Patient has seen dermatology.  Elevated BP without diagnosis of hypertension Improved on recheck.  He will start monitoring at home.   Orders Placed This Encounter  Procedures  . POCT HgB A1C    No orders of the defined types were placed in this encounter.    [name], MD [place]

## 2017-11-27 NOTE — Assessment & Plan Note (Signed)
Improved on recheck.  He will start monitoring at home.

## 2017-11-27 NOTE — Assessment & Plan Note (Signed)
Check A1c.  Continue current regimen. 

## 2018-01-21 DIAGNOSIS — S93401A Sprain of unspecified ligament of right ankle, initial encounter: Secondary | ICD-10-CM | POA: Diagnosis not present

## 2018-03-19 ENCOUNTER — Other Ambulatory Visit: Payer: Self-pay | Admitting: Family Medicine

## 2018-04-15 ENCOUNTER — Other Ambulatory Visit: Payer: Self-pay | Admitting: Family Medicine

## 2018-04-24 ENCOUNTER — Telehealth: Payer: Self-pay | Admitting: Family Medicine

## 2018-04-24 DIAGNOSIS — E785 Hyperlipidemia, unspecified: Secondary | ICD-10-CM

## 2018-04-24 NOTE — Telephone Encounter (Signed)
We could try Crestor if he is willing.

## 2018-04-24 NOTE — Telephone Encounter (Signed)
Copied from [place] [id number]. Topic: Quick Communication - See Telephone Encounter >> Apr 24, 2018  2:38 PM Antonieta Iba C wrote: CRM for notification. See Telephone encounter for: 04/24/18.  Pt says that he's been taking Lipitor for 2 years and would like to be advised. Pt says that he stopped taking medication yesterday because he felt like the medication Is causing bone pain. Pt says since stopping the medication he is feeling better. Pt would like to be advised by his PCP on a different cholesterol medication.   CB;[phone] -

## 2018-04-24 NOTE — Telephone Encounter (Signed)
Sent to PCP as an FYI advise if needed.   Thanks

## 2018-04-25 NOTE — Telephone Encounter (Signed)
Called patient and left a VM to call back. CRM created and sent to [facility] pool.  

## 2018-04-28 NOTE — Telephone Encounter (Signed)
Sent to PCP what dose would you want to start patient on pt would like to be advised.   Thanks

## 2018-04-28 NOTE — Telephone Encounter (Signed)
Patient is willing to try Crestor. What dose are you wanting to start him on?

## 2018-04-28 NOTE — Telephone Encounter (Signed)
Pt returned call and states he is willing to try Crestor and would like the prescription to be sent to [facility] order pharmacy.

## 2018-05-01 MED ORDER — ROSUVASTATIN CALCIUM 20 MG PO TABS: 20.0000 mg | ORAL_TABLET | Freq: Every day | ORAL | 3 refills | Status: DC

## 2018-05-01 NOTE — Telephone Encounter (Signed)
Crestor sent to pharmacy.  I initially sent it to his [place].  Can you please contact [facility] to cancel this prescription?  I sent it to [facility] as well.  He will need lab work 1 month after starting on the medication.  Please get him scheduled for this.  Thanks.

## 2018-05-01 NOTE — Telephone Encounter (Signed)
Called and spoke with pt. Pt advised and voiced understanding. Pt will have labs done at his next appt with you in Feb 2020. Called [facility] had Rx canceled.

## 2018-05-06 ENCOUNTER — Other Ambulatory Visit: Payer: Self-pay | Admitting: Family Medicine

## 2018-05-06 DIAGNOSIS — E119 Type 2 diabetes mellitus without complications: Secondary | ICD-10-CM

## 2018-05-13 ENCOUNTER — Other Ambulatory Visit: Payer: Self-pay | Admitting: *Deleted

## 2018-05-13 DIAGNOSIS — E119 Type 2 diabetes mellitus without complications: Secondary | ICD-10-CM

## 2018-05-13 MED ORDER — EMPAGLIFLOZIN 25 MG PO TABS: 25.0000 mg | ORAL_TABLET | Freq: Every day | ORAL | 1 refills | Status: DC

## 2018-05-28 ENCOUNTER — Ambulatory Visit: Payer: Medicare HMO | Admitting: Family Medicine

## 2018-07-12 ENCOUNTER — Other Ambulatory Visit: Payer: Self-pay | Admitting: Family Medicine

## 2018-09-05 ENCOUNTER — Telehealth: Payer: Self-pay | Admitting: Family Medicine

## 2018-09-05 ENCOUNTER — Telehealth: Payer: Self-pay | Admitting: *Deleted

## 2018-09-05 NOTE — Telephone Encounter (Signed)
Copied from [place] [id number]. Topic: General - Other >> Sep 05, 2018 10:43 AM Nils Flack wrote: Reason for CRM: pt returned call,please call back in 20 mins( 1105am) Thanks

## 2018-09-09 ENCOUNTER — Ambulatory Visit (INDEPENDENT_AMBULATORY_CARE_PROVIDER_SITE_OTHER): Payer: Medicare HMO | Admitting: Family Medicine

## 2018-09-09 ENCOUNTER — Other Ambulatory Visit: Payer: Self-pay

## 2018-09-09 ENCOUNTER — Encounter: Payer: Self-pay | Admitting: Family Medicine

## 2018-09-09 DIAGNOSIS — B353 Tinea pedis: Secondary | ICD-10-CM | POA: Diagnosis not present

## 2018-09-09 DIAGNOSIS — J449 Chronic obstructive pulmonary disease, unspecified: Secondary | ICD-10-CM | POA: Diagnosis not present

## 2018-09-09 DIAGNOSIS — F329 Major depressive disorder, single episode, unspecified: Secondary | ICD-10-CM | POA: Diagnosis not present

## 2018-09-09 DIAGNOSIS — F419 Anxiety disorder, unspecified: Secondary | ICD-10-CM | POA: Diagnosis not present

## 2018-09-09 DIAGNOSIS — F32A Depression, unspecified: Secondary | ICD-10-CM

## 2018-09-09 DIAGNOSIS — E119 Type 2 diabetes mellitus without complications: Secondary | ICD-10-CM | POA: Diagnosis not present

## 2018-09-09 DIAGNOSIS — E7800 Pure hypercholesterolemia, unspecified: Secondary | ICD-10-CM

## 2018-09-09 MED ORDER — KETOCONAZOLE 2 % EX CREA: 1.0000 "application " | TOPICAL_CREAM | Freq: Every day | CUTANEOUS | 0 refills | Status: DC

## 2018-09-09 NOTE — Assessment & Plan Note (Addendum)
Trial of topical ketoconazole.  Discussed that he would need to use this for 6 weeks.  If not improving he will contact us.

## 2018-09-09 NOTE — Progress Notes (Signed)
Virtual Visit via video Note  This visit type was conducted due to national recommendations for restrictions regarding the COVID-19 pandemic (e.g. social distancing).  This format is felt to be most appropriate for this patient at this time.  All issues noted in this document were discussed and addressed.  No physical exam was performed (except for noted visual exam findings with Video Visits).   I connected with Brad Jimenez today at  2:45 PM EDT by a video enabled telemedicine application and verified that I am speaking with the correct person using two identifiers. Location patient: home Location provider: work Persons participating in the virtual visit: patient, provider, Zhi Geier (wife)  I discussed the limitations, risks, security and privacy concerns of performing an evaluation and management service by telephone and the availability of in person appointments. I also discussed with the patient that there may be a patient responsible charge related to this service. The patient expressed understanding and agreed to proceed.   Reason for visit: Follow-up.  HPI: Diabetes: Fasting sugars around 117.  He is taking Jardiance daily.  Taking glipizide about 3 days a week when he eats a big meal.  No polyuria or polydipsia.  No hypoglycemia.  He is due to see ophthalmology.  Hyperlipidemia: Taking Crestor.  No chest pain, right upper quadrant pain, or myalgias.  Anxiety/depression: Patient denies any symptoms of anxiety and depression.  Continues on Zoloft.  Athlete's foot: Patient has had this intermittently for quite some time.  He has tried multiple over-the-counter creams that will help for 3 or 4 days and then the symptoms will come back.  He notes he has this on both feet in between the toes and slightly into the foot on the left foot.  Notes it does itch.   ROS: See pertinent positives and negatives per HPI.  Past Medical History:  Diagnosis Date  . Anxiety   . Arthritis   .  Asthma   . Chickenpox   . COPD (chronic obstructive pulmonary disease) ([place])   . Depression   . Diabetes ([place])   . History of blood transfusion   . Low back pain     Past Surgical History:  Procedure Laterality Date  . APPENDECTOMY  2002  . BACK SURGERY  2001, 2002  . CHOLECYSTECTOMY  2012    Family History  Problem Relation Age of Onset  . Arthritis Mother   . Lung cancer Mother   . Heart disease Mother   . Hypertension Mother   . Diabetes Mother   . Cancer Mother        Lung  . Diabetes Sister   . Tongue cancer Sister   . Alcoholism Father   . Stroke Father     SOCIAL HX: Former smoker.   Current Outpatient Medications:  .  empagliflozin (JARDIANCE) 25 MG TABS tablet, Take 25 mg by mouth daily., Disp: 90 tablet, Rfl: 1 .  glipiZIDE (GLUCOTROL) 5 MG tablet, TAKE 1 TABLET EVERY DAY WITH SUPPER, Disp: 90 tablet, Rfl: 1 .  glucose blood test strip, TEST BLOOD SUGAR TWICE DAILY, Disp: 200 each, Rfl: 2 .  Multiple Vitamin (MULTIVITAMIN WITH MINERALS) TABS tablet, Take 1 tablet by mouth daily., Disp: , Rfl:  .  naproxen sodium (ALEVE) 220 MG tablet, Take 2 tablets by mouth daily as needed., Disp: , Rfl:  .  RELION LANCETS STANDARD 21G MISC, 1 Device by Does not apply route 2 (two) times daily., Disp: 200 each, Rfl: 2 .  rosuvastatin (CRESTOR)  20 MG tablet, Take 1 tablet (20 mg total) by mouth daily., Disp: 90 tablet, Rfl: 3 .  sertraline (ZOLOFT) 50 MG tablet, TAKE 1 TABLET EVERY DAY, Disp: 90 tablet, Rfl: 1 .  ketoconazole (NIZORAL) 2 % cream, Apply 1 application topically daily. For 6 weeks, Disp: 30 g, Rfl: 0  EXAM:  VITALS per patient if applicable: None.  GENERAL: alert, oriented, appears well and in no acute distress  HEENT: atraumatic, conjunttiva clear, no obvious abnormalities on inspection of external nose and ears  NECK: normal movements of the head and neck  LUNGS: on inspection no signs of respiratory distress, breathing rate appears normal, no obvious  gross SOB, gasping or wheezing  CV: no obvious cyanosis  MS: moves all visible extremities without noticeable abnormality  PSYCH/NEURO: pleasant and cooperative, no obvious depression or anxiety, speech and thought processing grossly intact  ASSESSMENT AND PLAN:  Discussed the following assessment and plan:  Type 2 diabetes mellitus without complication, without long-term current use of insulin (HCC)  Anxiety and depression  Elevated LDL cholesterol level  Tinea pedis of both feet  Diabetes (HCC) Seems to be well controlled.  We will have him come in for lab work.  Continue current regimen.  Anxiety and depression Asymptomatic.  Continue current medication.  Elevated LDL cholesterol level Continue Crestor.  He will come in for lab work.  Tinea pedis Trial of topical ketoconazole.  Discussed that he would need to use this for 6 weeks.  If not improving he will contact us.  [place] office will contact the patient and get him scheduled for follow-up in 4 months and lab work in the next several weeks.  Social distancing precautions and sick precautions given regarding COVID-19.   I discussed the assessment and treatment plan with the patient. The patient was provided an opportunity to ask questions and all were answered. The patient agreed with the plan and demonstrated an understanding of the instructions.   The patient was advised to call back or seek an in-person evaluation if the symptoms worsen or if the condition fails to improve as anticipated.  [name], MD

## 2018-09-09 NOTE — Assessment & Plan Note (Signed)
Asymptomatic.  Continue current medication. 

## 2018-09-09 NOTE — Assessment & Plan Note (Signed)
Seems to be well controlled.  We will have him come in for lab work.  Continue current regimen.

## 2018-09-09 NOTE — Assessment & Plan Note (Signed)
Continue Crestor.  He will come in for lab work.

## 2018-09-22 ENCOUNTER — Ambulatory Visit: Payer: Medicare HMO | Admitting: Family Medicine

## 2018-09-22 ENCOUNTER — Other Ambulatory Visit: Payer: Self-pay

## 2018-09-22 ENCOUNTER — Ambulatory Visit (INDEPENDENT_AMBULATORY_CARE_PROVIDER_SITE_OTHER): Payer: Medicare HMO

## 2018-09-22 DIAGNOSIS — Z0000 Encounter for general adult medical examination without abnormal findings: Secondary | ICD-10-CM

## 2018-09-22 NOTE — Progress Notes (Signed)
Subjective:   Brad Jimenez. is a 48 y.o. male who presents for Medicare Annual/Subsequent preventive examination.  Review of Systems:  No ROS.  Medicare Wellness Virtual Visit.  Visual/audio telehealth visit, UTA vital signs.   See social history for additional risk factors.   Cardiac Risk Factors include: advanced age (>23men, >18 women);male gender;hypertension;diabetes mellitus     Objective:    Vitals: There were no vitals taken for this visit.  There is no height or weight on file to calculate BMI.  Advanced Directives 09/22/2018 09/17/2017 09/14/2016 04/07/2015 02/20/2015  Does Patient Have a Medical Advance Directive? No No No No No  Would patient like information on creating a medical advance directive? No - Patient declined Yes (MAU/Ambulatory/Procedural Areas - Information given) Yes (MAU/Ambulatory/Procedural Areas - Information given) No - patient declined information No - patient declined information    Tobacco Social History   Tobacco Use  Smoking Status Former Smoker  . Packs/day: 1.50  . Years: 22.00  . Pack years: 33.00  . Types: Cigarettes  . Quit date: 02/20/2011  . Years since quitting: 7.5  Smokeless Tobacco Never Used     Counseling given: Not Answered   Clinical Intake:  Pre-visit preparation completed: Yes        Diabetes: Yes(Followed by pcp)  How often do you need to have someone help you when you read instructions, pamphlets, or other written materials from your doctor or pharmacy?: 1 - Never  Interpreter Needed?: No     Past Medical History:  Diagnosis Date  . Anxiety   . Arthritis   . Asthma   . Chickenpox   . COPD (chronic obstructive pulmonary disease) ([place])   . Depression   . Diabetes ([place])   . History of blood transfusion   . Low back pain    Past Surgical History:  Procedure Laterality Date  . APPENDECTOMY  2002  . BACK SURGERY  2001, 2002  . CHOLECYSTECTOMY  2012   Family History  Problem Relation Age of  Onset  . Arthritis Mother   . Lung cancer Mother   . Heart disease Mother   . Hypertension Mother   . Diabetes Mother   . Cancer Mother        Lung  . Diabetes Sister   . Tongue cancer Sister   . Alcoholism Father   . Stroke Father    Social History   Socioeconomic History  . Marital status: Married    Spouse name: Not on file  . Number of children: Not on file  . Years of education: Not on file  . Highest education level: Not on file  Occupational History  . Not on file  Social Needs  . Financial resource strain: Not hard at all  . Food insecurity    Worry: Never true    Inability: Never true  . Transportation needs    Medical: No    Non-medical: No  Tobacco Use  . Smoking status: Former Smoker    Packs/day: 1.50    Years: 22.00    Pack years: 33.00    Types: Cigarettes    Quit date: 02/20/2011    Years since quitting: 7.5  . Smokeless tobacco: Never Used  Substance and Sexual Activity  . Alcohol use: Yes    Alcohol/week: 2.0 standard drinks    Types: 2 Cans of beer per week    Comment: occasional  . Drug use: No  . Sexual activity: Yes  Lifestyle  .  Physical activity    Days per week: 3 days    Minutes per session: 20 min  . Stress: Not at all  Relationships  . Social Herbalist on phone: Not on file    Gets together: Not on file    Attends religious service: Not on file    Active member of club or organization: Not on file    Attends meetings of clubs or organizations: Not on file    Relationship status: Not on file  Other Topics Concern  . Not on file  Social History Narrative  . Not on file    Outpatient Encounter Medications as of 09/22/2018  Medication Sig  . empagliflozin (JARDIANCE) 25 MG TABS tablet Take 25 mg by mouth daily.  Marland Kitchen glipiZIDE (GLUCOTROL) 5 MG tablet TAKE 1 TABLET EVERY DAY WITH SUPPER  . glucose blood test strip TEST BLOOD SUGAR TWICE DAILY  . ketoconazole (NIZORAL) 2 % cream Apply 1 application topically daily.  For 6 weeks  . Multiple Vitamin (MULTIVITAMIN WITH MINERALS) TABS tablet Take 1 tablet by mouth daily.  . naproxen sodium (ALEVE) 220 MG tablet Take 2 tablets by mouth daily as needed.  Marland Kitchen RELION LANCETS STANDARD 21G MISC 1 Device by Does not apply route 2 (two) times daily.  . rosuvastatin (CRESTOR) 20 MG tablet Take 1 tablet (20 mg total) by mouth daily.  . sertraline (ZOLOFT) 50 MG tablet TAKE 1 TABLET EVERY DAY   No facility-administered encounter medications on file as of 09/22/2018.     Activities of Daily Living In your present state of health, do you have any difficulty performing the following activities: 09/22/2018  Hearing? N  Vision? N  Difficulty concentrating or making decisions? N  Walking or climbing stairs? N  Dressing or bathing? N  Doing errands, shopping? N  Preparing Food and eating ? N  Using the Toilet? N  In the past six months, have you accidently leaked urine? N  Do you have problems with loss of bowel control? N  Managing your Medications? N  Managing your Finances? N  Housekeeping or managing your Housekeeping? N  Some recent data might be hidden    Patient Care Team: Leone Haven, MD as PCP - General (Family Medicine)   Assessment:   This is a routine wellness examination for Brad Jimenez.  I connected with patient 09/22/18 at  1:00 PM EDT by a video/audio enabled telemedicine application and verified that I am speaking with the correct person using two identifiers. Patient stated full name and DOB. Patient gave permission to continue with virtual visit. Patient's location was at home and Nurse's location was at [place] office.   Notes he monitors his blood sugar weekly averaging 117-120.   Fasting labs scheduled 10/01/18.   Health Screenings  Glaucoma -none Hearing -demonstrates normal hearing during visit. POCT Hemoglobin A1C - 07/2017 Cholesterol - 07/2017 Dental- UTD Vision- plans to schedule  Social  Alcohol intake - yes      Smoking history-  former    Smokers in home? none Illicit drug use? 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  Educational material provided.   Safety  Patient feels safe at home- yes Patient does have smoke detectors at home- yes Patient does wear sunscreen or protective clothing when in direct sunlight -yes Patient does wear seat belt when in  a moving vehicle -yes Patient drives- yes  [id number] precautions and sickness symptoms discussed.   Activities of Daily Living Patient denies needing assistance with: driving, household chores, feeding themselves, getting from bed to chair, getting to the toilet, bathing/showering, dressing, managing money, or preparing meals.   Depression Screen Patient denies losing interest in daily life, feeling hopeless, or crying easily over simple problems.   Medication-taking as directed and without issues.   Fall Screen Patient denies being afraid of falling or falling in the last year.   Memory Screen Patient is alert.  Patient denies difficulty focusing, concentrating or misplacing items. Correctly identified the president of the [place] , season and recall. Patient likes to play computer games and watch trivia and mystery shows on tv for brain stimulation.  Immunizations The following Immunizations were discussed: Influenza, shingles, pneumonia, and tetanus.   Other Providers Patient Care Team: Leone Haven, MD as PCP - General (Family Medicine)  Exercise Activities and Dietary recommendations Current Exercise Habits: Home exercise routine  Goals      Patient Stated   . Follow up with Primary Care Provider (pt-stated)     As needed.  Maintain healthy lifestyle       Fall Risk Fall Risk  09/22/2018 09/17/2017 09/14/2016 05/16/2015 05/10/2015  Falls in the past year? 0 No No No No   Depression Screen  PHQ 2/9 Scores 09/22/2018 09/17/2017 09/14/2016 04/13/2015  PHQ - 2 Score 0 0 0 0  PHQ- 9 Score - - - 0    Cognitive Function MMSE - Mini Mental State Exam 09/17/2017 09/14/2016  Orientation to time 5 5  Orientation to Place 5 5  Registration 3 3  Attention/ Calculation 5 5  Recall 3 3  Language- name 2 objects 2 2  Language- repeat 1 1  Language- follow 3 step command 3 3  Language- read & follow direction 1 1  Write a sentence 1 1  Copy design 1 1  Total score 30 30     6CIT Screen 09/22/2018  What Year? 0 points  What month? 0 points  What time? 0 points  Count back from 20 0 points  Months in reverse 0 points  Repeat phrase 0 points  Total Score 0    Immunization History  Administered Date(s) Administered  . Pneumococcal Polysaccharide-23 06/07/2015  . Tdap 06/07/2015   Screening Tests Health Maintenance  Topic Date Due  . OPHTHALMOLOGY EXAM  02/18/2018  . HEMOGLOBIN A1C  05/30/2018  . FOOT EXAM  08/29/2018  . URINE MICROALBUMIN  08/29/2018  . INFLUENZA VACCINE  11/01/2018  . TETANUS/TDAP  06/06/2025  . PNEUMOCOCCAL POLYSACCHARIDE VACCINE AGE 27-64 HIGH RISK  Completed  . HIV Screening  Completed      Plan:   End of life planning; Advanced aging; Advanced directives discussed.  No HCPOA/Living Will.  Additional information declined at this time.  I have personally reviewed and noted the following in the patient's chart:   . Medical and social history . Use of alcohol, tobacco or illicit drugs  . Current medications and supplements . Functional ability and status . Nutritional status . Physical activity . Advanced directives . List of other physicians . Hospitalizations, surgeries, and ER visits in previous 12 months . Vitals . Screenings to include cognitive, depression, and falls . Referrals and appointments  In addition, I have reviewed and discussed with patient certain preventive protocols, quality metrics, and best practice recommendations. A  written personalized care plan for preventive services as well  as general preventive health recommendations were provided to patient.     Varney Biles, LPN  [id number]/[id number]

## 2018-09-22 NOTE — Patient Instructions (Addendum)
  Brad Jimenez , Thank you for taking time to come for your Medicare Wellness Visit. I appreciate your ongoing commitment to your health goals. Please review the following plan we discussed and let me know if I can assist you in the future.   These are the goals we discussed: Goals      Patient Stated   . Follow up with Primary Care Provider (pt-stated)     As needed.  Maintain healthy lifestyle       This is a list of the screening recommended for you and due dates:  Health Maintenance  Topic Date Due  . Eye exam for diabetics  02/18/2018  . Hemoglobin A1C  05/30/2018  . Complete foot exam   08/29/2018  . Urine Protein Check  08/29/2018  . Flu Shot  11/01/2018  . Tetanus Vaccine  06/06/2025  . Pneumococcal vaccine  Completed  . HIV Screening  Completed

## 2018-09-25 NOTE — Progress Notes (Signed)
I have reviewed the above note and agree.  Eric Sonnenberg, M.D.  

## 2018-10-01 ENCOUNTER — Other Ambulatory Visit: Payer: Self-pay

## 2018-10-01 ENCOUNTER — Other Ambulatory Visit (INDEPENDENT_AMBULATORY_CARE_PROVIDER_SITE_OTHER): Payer: Medicare HMO

## 2018-10-01 DIAGNOSIS — E119 Type 2 diabetes mellitus without complications: Secondary | ICD-10-CM

## 2018-10-01 DIAGNOSIS — E7800 Pure hypercholesterolemia, unspecified: Secondary | ICD-10-CM

## 2018-10-01 LAB — HEPATIC FUNCTION PANEL
ALT: 41 U/L (ref 0–53)
AST: 22 U/L (ref 0–37)
Albumin: 4.3 g/dL (ref 3.5–5.2)
Alkaline Phosphatase: 91 U/L (ref 39–117)
Bilirubin, Direct: 0.1 mg/dL (ref 0.0–0.3)
Total Bilirubin: 0.6 mg/dL (ref 0.2–1.2)
Total Protein: 7 g/dL (ref 6.0–8.3)

## 2018-10-01 LAB — LDL CHOLESTEROL, DIRECT: Direct LDL: 77 mg/dL

## 2018-10-01 LAB — HEMOGLOBIN A1C: Hgb A1c MFr Bld: 6 % (ref 4.6–6.5)

## 2018-10-09 ENCOUNTER — Other Ambulatory Visit: Payer: Self-pay | Admitting: Family Medicine

## 2018-10-09 DIAGNOSIS — E785 Hyperlipidemia, unspecified: Secondary | ICD-10-CM

## 2018-10-09 MED ORDER — ROSUVASTATIN CALCIUM 40 MG PO TABS: 40.0000 mg | ORAL_TABLET | Freq: Every day | ORAL | 1 refills | Status: DC

## 2018-11-21 NOTE — Telephone Encounter (Signed)
err

## 2018-12-10 ENCOUNTER — Other Ambulatory Visit: Payer: Self-pay | Admitting: Family Medicine

## 2018-12-10 DIAGNOSIS — E119 Type 2 diabetes mellitus without complications: Secondary | ICD-10-CM

## 2019-01-16 ENCOUNTER — Other Ambulatory Visit: Payer: Self-pay

## 2019-01-19 ENCOUNTER — Other Ambulatory Visit: Payer: Self-pay | Admitting: Family Medicine

## 2019-01-19 ENCOUNTER — Ambulatory Visit (INDEPENDENT_AMBULATORY_CARE_PROVIDER_SITE_OTHER): Payer: Medicare HMO | Admitting: Family Medicine

## 2019-01-19 ENCOUNTER — Encounter: Payer: Self-pay | Admitting: Family Medicine

## 2019-01-19 ENCOUNTER — Other Ambulatory Visit: Payer: Self-pay

## 2019-01-19 VITALS — BP 130/80 | HR 77 | Temp 98.4°F | Ht 68.0 in | Wt 226.6 lb

## 2019-01-19 DIAGNOSIS — M654 Radial styloid tenosynovitis [de Quervain]: Secondary | ICD-10-CM | POA: Insufficient documentation

## 2019-01-19 DIAGNOSIS — F419 Anxiety disorder, unspecified: Secondary | ICD-10-CM

## 2019-01-19 DIAGNOSIS — E7800 Pure hypercholesterolemia, unspecified: Secondary | ICD-10-CM | POA: Diagnosis not present

## 2019-01-19 DIAGNOSIS — E119 Type 2 diabetes mellitus without complications: Secondary | ICD-10-CM | POA: Diagnosis not present

## 2019-01-19 DIAGNOSIS — F329 Major depressive disorder, single episode, unspecified: Secondary | ICD-10-CM

## 2019-01-19 DIAGNOSIS — F32A Depression, unspecified: Secondary | ICD-10-CM

## 2019-01-19 LAB — COMPREHENSIVE METABOLIC PANEL
ALT: 39 U/L (ref 0–53)
AST: 20 U/L (ref 0–37)
Albumin: 4.4 g/dL (ref 3.5–5.2)
Alkaline Phosphatase: 89 U/L (ref 39–117)
BUN: 12 mg/dL (ref 6–23)
CO2: 25 mEq/L (ref 19–32)
Calcium: 9.4 mg/dL (ref 8.4–10.5)
Chloride: 102 mEq/L (ref 96–112)
Creatinine, Ser: 0.77 mg/dL (ref 0.40–1.50)
GFR: 107.22 mL/min (ref >60.00)
Glucose, Bld: 174 mg/dL — ABNORMAL HIGH (ref 70–99)
Potassium: 4 mEq/L (ref 3.5–5.1)
Sodium: 136 mEq/L (ref 135–145)
Total Bilirubin: 0.5 mg/dL (ref 0.2–1.2)
Total Protein: 7.4 g/dL (ref 6.0–8.3)

## 2019-01-19 LAB — MICROALBUMIN / CREATININE URINE RATIO
Creatinine,U: 66 mg/dL
Microalb Creat Ratio: 2.8 mg/g (ref 0.0–30.0)
Microalb, Ur: 1.9 mg/dL (ref 0.0–1.9)

## 2019-01-19 LAB — HEMOGLOBIN A1C: Hgb A1c MFr Bld: 7.2 % — ABNORMAL HIGH (ref 4.6–6.5)

## 2019-01-19 LAB — LDL CHOLESTEROL, DIRECT: Direct LDL: 74 mg/dL

## 2019-01-19 NOTE — Assessment & Plan Note (Signed)
Check A1c.  Continue Jardiance. 

## 2019-01-19 NOTE — Assessment & Plan Note (Signed)
Well-controlled.  Continue current regimen. 

## 2019-01-19 NOTE — Assessment & Plan Note (Signed)
Check LDL and CMP.  Continue Crestor. 

## 2019-01-19 NOTE — Assessment & Plan Note (Signed)
Symptoms are consistent with this.  Discussed icing for 10 minutes 3 times daily with a frozen bag of peas.  If not improving over the next 1 to 2 weeks he will let us know.  Discussed as needed Aleve use.

## 2019-01-19 NOTE — Progress Notes (Signed)
Brad Rumps, MD Phone: [phone]  Danner Paulding. is a 48 y.o. male who presents today for follow-up.  Right wrist pain: Patient notes this has been going on for about 3 weeks.  Its hurting on the radial aspect mostly though also on the ulnar aspect.  He notes if he moves his wrist it will pop.  Notes it throbs at times.  Notes the pain can be quite severe at times.  He notes no injury.  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  He intermittently takes ibuprofen or Aleve for the discomfort.  Hyperlipidemia: Taking Crestor.  No chest pain.  No right upper quadrant pain.  No myalgias.  Diabetes: Typically around 130 fasting.  Taking Jardiance.  No polyuria or polydipsia.  He had one episode of hypoglycemia to the 70s when he took an extra [place] without realizing it.  Depression/anxiety: He notes no depression or anxiety.  He continues on Zoloft.  No SI.  Social History   Tobacco Use  Smoking Status Former Smoker  . Packs/day: 1.50  . Years: 22.00  . Pack years: 33.00  . Types: Cigarettes  . Quit date: 02/20/2011  . Years since quitting: 7.9  Smokeless Tobacco Never Used     ROS see history of present illness  Objective  Physical Exam Vitals:   01/19/19 1322  BP: 130/80  Pulse: 77  Temp: 98.4 F (36.9 C)  SpO2: 96%    BP Readings from Last 3 Encounters:  01/19/19 130/80  11/27/17 124/82  09/17/17 106/64   Wt Readings from Last 3 Encounters:  01/19/19 226 lb 9.6 oz (102.8 kg)  11/27/17 237 lb (107.5 kg)  09/17/17 235 lb 12.8 oz (107 kg)    Physical Exam Constitutional:      General: He is not in acute distress.    Appearance: He is not diaphoretic.  Cardiovascular:     Rate and Rhythm: Normal rate and regular rhythm.     Heart sounds: Normal heart sounds.  Pulmonary:     Effort: Pulmonary effort is normal.     Breath sounds: Normal breath sounds.  Musculoskeletal:     Comments: Tenderness over the  radial styloid process on the right wrist, no tenderness elsewhere in the wrist, no anatomic snuffbox tenderness, no swelling of the right wrist, no warmth of the right wrist, no erythema of the right wrist, positive Finkelstein's on the right  Skin:    General: Skin is warm and dry.  Neurological:     Mental Status: He is alert.    Diabetic Foot Exam - Simple   Simple Foot Form Diabetic Foot exam was performed with the following findings: Yes 01/19/2019  1:39 PM  Visual Inspection No deformities, no ulcerations, no other skin breakdown bilaterally: Yes Sensation Testing Intact to touch and monofilament testing bilaterally: Yes Pulse Check Posterior Tibialis and Dorsalis pulse intact bilaterally: Yes Comments      Assessment/Plan: Please see individual problem list.  De Quervain's tenosynovitis, right Symptoms are consistent with this.  Discussed icing for 10 minutes 3 times daily with a frozen bag of peas.  If not improving over the next 1 to 2 weeks he will let us know.  Discussed as needed Aleve use.  Diabetes (HCC) Check A1c.  Continue Jardiance.  Anxiety and depression Well-controlled.  Continue current regimen.  Elevated LDL cholesterol level Check LDL and CMP.  Continue Crestor.   Health Maintenance: declines flu vaccine.  Foot exam completed.   Orders Placed This Encounter  Procedures  . Comp Met (CMET)  . Direct LDL  . HgB A1c  . Urine Microalbumin w/creat. ratio    No orders of the defined types were placed in this encounter.    Brad Rumps, MD [place]

## 2019-01-19 NOTE — Patient Instructions (Signed)
Nice to see you. Please ice with a bag of peas for 10 minutes 3 times daily.  If not improving over the next 1 to 2 weeks please let us know. We will get lab work today and contact you with the results.

## 2019-01-20 ENCOUNTER — Other Ambulatory Visit: Payer: Self-pay | Admitting: Family Medicine

## 2019-02-02 ENCOUNTER — Telehealth: Payer: Self-pay | Admitting: *Deleted

## 2019-02-02 DIAGNOSIS — M654 Radial styloid tenosynovitis [de Quervain]: Secondary | ICD-10-CM

## 2019-02-02 NOTE — Telephone Encounter (Signed)
Copied from [place] [phone]. Topic: General - Other >> Feb 02, 2019 12:33 PM Mcneil, Ja-Kwan wrote: Reason for CRM: Pt stated his wrist is not getting any better and he was told that he may need to be referred to an orthopedic. Pt stated he would like to proceed to the next step due to his wrist not getting any better. Pt requests call back

## 2019-02-02 NOTE — Telephone Encounter (Signed)
This patient is still experiencing pain in the right wrist and he is ready for a injection that you and him discussed at the last visit. Please advise.  Aaliya Maultsby,cma

## 2019-02-03 NOTE — Telephone Encounter (Signed)
I called and spoke with the patient and he stated he would wait on the referral to call him.  Nina,cma

## 2019-02-03 NOTE — Telephone Encounter (Signed)
Referral placed. If the pain is particularly bothersome he can go to the [facility] urgent walk in clinic in [place]. They are open from 1-7 M-F. Thanks.

## 2019-02-13 NOTE — Telephone Encounter (Signed)
Pt and his wife called to follow up on referral for orthopedic. They would like to have a callback from [place]. Please advise.

## 2019-02-16 NOTE — Telephone Encounter (Signed)
I called and spoke with the patient and informed him that he should be hearing from the referral this week, I also stated he could go to [facility] for a walkin appointment from 1-7 and he stated he may do that.  Dotti Busey,cma

## 2019-02-19 DIAGNOSIS — M654 Radial styloid tenosynovitis [de Quervain]: Secondary | ICD-10-CM | POA: Diagnosis not present

## 2019-02-25 ENCOUNTER — Other Ambulatory Visit: Payer: Self-pay | Admitting: Family Medicine

## 2019-02-25 DIAGNOSIS — E119 Type 2 diabetes mellitus without complications: Secondary | ICD-10-CM

## 2019-03-16 ENCOUNTER — Telehealth: Payer: Self-pay | Admitting: Family Medicine

## 2019-03-16 NOTE — Telephone Encounter (Signed)
Pt dropped off life insurance forms to be completed by Dr. Caryl Bis. Forms are up front in color folder.

## 2019-03-17 NOTE — Telephone Encounter (Signed)
Insurance forms picked up from folder and placed in signed basket.  Joshaua Epple,cma

## 2019-03-22 ENCOUNTER — Telehealth: Payer: Self-pay | Admitting: Family Medicine

## 2019-03-22 NOTE — Telephone Encounter (Signed)
This patient dropped off disability paperwork to be filled out.  Please see if he can complete a in office visit or a virtual visit to discuss this further.  I have filled this out for him previously as a continuation of prior disability paperwork.  I am happy to get it filled out though I do need to do a visit with him.  Please find out when this is due.  Thanks.

## 2019-03-23 NOTE — Telephone Encounter (Signed)
Patient was called and he states he has to have the form back by 04/12/2018 and he has to mail it in by then Please advise on where to schedule for this.  Kindred Reidinger,cma

## 2019-03-24 NOTE — Telephone Encounter (Signed)
He can be added in at 1 PM on 04/06/2018.  This should be an in person visit.

## 2019-03-24 NOTE — Telephone Encounter (Signed)
I called and left a voicemail for the patient informing him that the provider wanted to see him on 04/06/2018 @ 1pm I scheduled this and left him a voicemail informing him of the appt date and time. Deleah Tison,cma

## 2019-03-25 NOTE — Telephone Encounter (Signed)
I spoke with the patient and the date and time was ok and confirmed.  Bibi Economos,cma

## 2019-04-04 ENCOUNTER — Other Ambulatory Visit: Payer: Self-pay | Admitting: Family Medicine

## 2019-04-04 DIAGNOSIS — E785 Hyperlipidemia, unspecified: Secondary | ICD-10-CM

## 2019-04-07 ENCOUNTER — Ambulatory Visit (INDEPENDENT_AMBULATORY_CARE_PROVIDER_SITE_OTHER): Payer: Medicare HMO | Admitting: Family Medicine

## 2019-04-07 ENCOUNTER — Encounter: Payer: Self-pay | Admitting: Family Medicine

## 2019-04-07 ENCOUNTER — Other Ambulatory Visit: Payer: Self-pay

## 2019-04-07 VITALS — BP 120/80 | HR 74 | Temp 96.9°F | Ht 68.0 in | Wt 231.0 lb

## 2019-04-07 DIAGNOSIS — Z6835 Body mass index (BMI) 35.0-35.9, adult: Secondary | ICD-10-CM | POA: Diagnosis not present

## 2019-04-07 DIAGNOSIS — M5442 Lumbago with sciatica, left side: Secondary | ICD-10-CM | POA: Diagnosis not present

## 2019-04-07 DIAGNOSIS — E119 Type 2 diabetes mellitus without complications: Secondary | ICD-10-CM | POA: Diagnosis not present

## 2019-04-07 DIAGNOSIS — G8929 Other chronic pain: Secondary | ICD-10-CM

## 2019-04-07 DIAGNOSIS — E669 Obesity, unspecified: Secondary | ICD-10-CM | POA: Insufficient documentation

## 2019-04-07 DIAGNOSIS — E7800 Pure hypercholesterolemia, unspecified: Secondary | ICD-10-CM

## 2019-04-07 MED ORDER — JARDIANCE 25 MG PO TABS: 25.0000 mg | ORAL_TABLET | Freq: Every day | ORAL | 0 refills | Status: DC

## 2019-04-07 NOTE — Progress Notes (Signed)
[name], MD Phone: [phone]  Brad Jimenez. is a 49 y.o. male who presents today for follow-up.  Chronic low back pain: Patient notes he has been on disability for this through Social Security disability since the year 2000.  Notes his back pain relates to an injury while working.  He notes continued low back discomfort.  He has a chronic dull ache every day.  Some days are worse than others.  When he has an exacerbation it will feel like there is a knife stabbing him in the left low back.  He will have left-sided sciatica as well.  Notes he continues to have some numbness that was not nearly as noticeable as it had been previously and he thinks he is used to it.  No weakness.  No incontinence.  No saddle anesthesia.  He does take Aleve 3 times a week.  He does not lift more than 10 pounds as it hurts his back to do so.  Diabetes: Typically 140s in the morning and has had some 150s in the evening.  Taking Jardiance.  No polyuria or polydipsia.  No hypoglycemia.  Obesity: He is unable to do much exercise given his back discomfort.  He has not been eating overly healthily recently with lots of snacks.  He drinks 64 ounces of water daily though also drinks a gallon of nonsweet decaf tea.  He is trying to substitute peppermints for some of his snacking.  Social History   Tobacco Use  Smoking Status Former Smoker  . Packs/day: 1.50  . Years: 22.00  . Pack years: 33.00  . Types: Cigarettes  . Quit date: 02/20/2011  . Years since quitting: 8.1  Smokeless Tobacco Never Used     ROS see history of present illness  Objective  Physical Exam Vitals:   04/07/19 1308  BP: 120/80  Pulse: 74  Temp: (!) 96.9 F (36.1 C)  SpO2: 98%    BP Readings from Last 3 Encounters:  04/07/19 120/80  01/19/19 130/80  11/27/17 124/82   Wt Readings from Last 3 Encounters:  04/07/19 231 lb (104.8 kg)  01/19/19 226 lb 9.6 oz (102.8 kg)  11/27/17 237 lb (107.5 kg)    Physical  Exam Constitutional:      General: He is not in acute distress.    Appearance: He is not diaphoretic.  Cardiovascular:     Rate and Rhythm: Normal rate and regular rhythm.     Heart sounds: Normal heart sounds.  Pulmonary:     Effort: Pulmonary effort is normal.     Breath sounds: Normal breath sounds.  Musculoskeletal:     Comments: No midline spine tenderness, no midline spine step-off, there is left lower back muscular tenderness noted on exam, no overlying skin changes, there is a midline lumbar spine scar that is well-healed  Skin:    General: Skin is warm and dry.  Neurological:     Mental Status: He is alert.     Comments: 5/5 strength bilateral quads, hamstrings, plantar flexion, and dorsiflexion, sensation light touch intact bilateral lower extremities, 2+ patellar reflexes bilaterally      Assessment/Plan: Please see individual problem list.  Diabetes ([place]) Slightly above goal.  Encourage dietary changes.  He will continue Jardiance.  Samples provided as the patient reports he is in the donut hole.  He will come in to the office for labs in 3 weeks when he is due for an A1c.  Obesity Encouraged dietary changes.  Encourage increased water  intake.  He will try to be as active as he can given his back issues.  Chronic low back pain Chronic back issues.  Patient notes this is stable.  He has forms that need to be filled out for waiver of premium for life insurance based on his disability.  Patient has been on Social Security disability since the year 2000.  His exam is relatively benign today.  I discussed with him that he certainly could not go back to working his prior job though may be able to do a sedentary job and that will be outlined on his form.  Forms completed and given back to the patient.  He will continue as needed Aleve.  He will monitor his symptoms.    Orders Placed This Encounter  Procedures  . Hemoglobin A1c    Standing Status:   Future    Standing  Expiration Date:   04/06/2020  . Comp Met (CMET)    Standing Status:   Future    Standing Expiration Date:   04/06/2020  . LDL cholesterol, direct    Standing Status:   Future    Standing Expiration Date:   04/06/2020    Meds ordered this encounter  Medications  . empagliflozin (JARDIANCE) 25 MG TABS tablet    Sig: Take 25 mg by mouth daily. Lot # [id number] Exp date 10/2020    Dispense:  7 tablet    Refill:  0    This visit occurred during the SARS-CoV-2 public health emergency.  Safety protocols were in place, including screening questions prior to the visit, additional usage of staff PPE, and extensive cleaning of exam room while observing appropriate contact time as indicated for disinfecting solutions.    I have spent 42 minutes minutes in the care of this patient regarding history taking, exam, preparation of his sample medications, documentation, and completion of his form.   [name], MD [place]

## 2019-04-07 NOTE — Assessment & Plan Note (Signed)
Slightly above goal.  Encourage dietary changes.  He will continue Jardiance.  Samples provided as the patient reports he is in the donut hole.  He will come in to the office for labs in 3 weeks when he is due for an A1c.

## 2019-04-07 NOTE — Assessment & Plan Note (Signed)
Encouraged dietary changes.  Encourage increased water intake.  He will try to be as active as he can given his back issues.

## 2019-04-07 NOTE — Assessment & Plan Note (Signed)
Chronic back issues.  Patient notes this is stable.  He has forms that need to be filled out for waiver of premium for life insurance based on his disability.  Patient has been on Social Security disability since the year 2000.  His exam is relatively benign today.  I discussed with him that he certainly could not go back to working his prior job though may be able to do a sedentary job and that will be outlined on his form.  Forms completed and given back to the patient.  He will continue as needed Aleve.  He will monitor his symptoms.

## 2019-04-22 ENCOUNTER — Other Ambulatory Visit: Payer: Self-pay

## 2019-04-23 DIAGNOSIS — S0501XA Injury of conjunctiva and corneal abrasion without foreign body, right eye, initial encounter: Secondary | ICD-10-CM | POA: Diagnosis not present

## 2019-04-27 DIAGNOSIS — S0501XD Injury of conjunctiva and corneal abrasion without foreign body, right eye, subsequent encounter: Secondary | ICD-10-CM | POA: Diagnosis not present

## 2019-04-28 ENCOUNTER — Other Ambulatory Visit (INDEPENDENT_AMBULATORY_CARE_PROVIDER_SITE_OTHER): Payer: Medicare HMO

## 2019-04-28 ENCOUNTER — Other Ambulatory Visit: Payer: Self-pay

## 2019-04-28 DIAGNOSIS — E119 Type 2 diabetes mellitus without complications: Secondary | ICD-10-CM | POA: Diagnosis not present

## 2019-04-28 DIAGNOSIS — E7800 Pure hypercholesterolemia, unspecified: Secondary | ICD-10-CM

## 2019-04-28 DIAGNOSIS — E785 Hyperlipidemia, unspecified: Secondary | ICD-10-CM | POA: Diagnosis not present

## 2019-04-28 LAB — COMPREHENSIVE METABOLIC PANEL
ALT: 46 U/L (ref 0–53)
AST: 23 U/L (ref 0–37)
Albumin: 4.7 g/dL (ref 3.5–5.2)
Alkaline Phosphatase: 86 U/L (ref 39–117)
BUN: 17 mg/dL (ref 6–23)
CO2: 29 mEq/L (ref 19–32)
Calcium: 9.8 mg/dL (ref 8.4–10.5)
Chloride: 101 mEq/L (ref 96–112)
Creatinine, Ser: 0.84 mg/dL (ref 0.40–1.50)
GFR: 96.87 mL/min (ref >60.00)
Glucose, Bld: 125 mg/dL — ABNORMAL HIGH (ref 70–99)
Potassium: 3.9 mEq/L (ref 3.5–5.1)
Sodium: 137 mEq/L (ref 135–145)
Total Bilirubin: 0.6 mg/dL (ref 0.2–1.2)
Total Protein: 7.5 g/dL (ref 6.0–8.3)

## 2019-04-28 LAB — HEPATIC FUNCTION PANEL
ALT: 46 U/L (ref 0–53)
AST: 23 U/L (ref 0–37)
Albumin: 4.7 g/dL (ref 3.5–5.2)
Alkaline Phosphatase: 86 U/L (ref 39–117)
Bilirubin, Direct: 0.1 mg/dL (ref 0.0–0.3)
Total Bilirubin: 0.6 mg/dL (ref 0.2–1.2)
Total Protein: 7.5 g/dL (ref 6.0–8.3)

## 2019-04-28 LAB — HEMOGLOBIN A1C: Hgb A1c MFr Bld: 6.8 % — ABNORMAL HIGH (ref 4.6–6.5)

## 2019-04-28 LAB — LDL CHOLESTEROL, DIRECT: Direct LDL: 89 mg/dL

## 2019-05-12 DIAGNOSIS — H524 Presbyopia: Secondary | ICD-10-CM | POA: Diagnosis not present

## 2019-05-14 ENCOUNTER — Other Ambulatory Visit: Payer: Self-pay | Admitting: Family Medicine

## 2019-05-14 DIAGNOSIS — E785 Hyperlipidemia, unspecified: Secondary | ICD-10-CM

## 2019-05-14 MED ORDER — EZETIMIBE 10 MG PO TABS: 10.0000 mg | ORAL_TABLET | Freq: Every day | ORAL | 3 refills | Status: DC

## 2019-05-22 ENCOUNTER — Ambulatory Visit: Payer: Medicare HMO | Admitting: Family Medicine

## 2019-06-23 ENCOUNTER — Other Ambulatory Visit (INDEPENDENT_AMBULATORY_CARE_PROVIDER_SITE_OTHER): Payer: Medicare HMO

## 2019-06-23 ENCOUNTER — Other Ambulatory Visit: Payer: Self-pay

## 2019-06-23 DIAGNOSIS — E785 Hyperlipidemia, unspecified: Secondary | ICD-10-CM

## 2019-06-23 LAB — LDL CHOLESTEROL, DIRECT: Direct LDL: 55 mg/dL

## 2019-06-29 ENCOUNTER — Ambulatory Visit: Payer: Medicare HMO | Attending: Internal Medicine

## 2019-06-29 DIAGNOSIS — Z23 Encounter for immunization: Secondary | ICD-10-CM

## 2019-06-29 NOTE — Progress Notes (Signed)
   [id number] [facility]  Name:  Brad Jimenez.    MRN: [id number] DOB: 17-May-1970  06/29/2019  [name] was observed post Covid-19 immunization for 15 minutes without incident. He was provided with Vaccine Information Sheet and instruction to access the V-Safe system.   [name] was instructed to call 911 with any severe reactions post vaccine: Marland Kitchen Difficulty breathing  . Swelling of face and throat  . A fast heartbeat  . A bad rash all over body  . Dizziness and weakness   Immunizations Administered    Name Date Dose VIS Date Route   Pfizer COVID-19 Vaccine 06/29/2019  1:07 PM 0.3 mL 03/13/2019 Intramuscular   Manufacturer: [place]   Lot: [id number]   [place]: [id number]

## 2019-07-22 ENCOUNTER — Ambulatory Visit: Payer: Medicare HMO | Attending: Internal Medicine

## 2019-07-22 DIAGNOSIS — Z23 Encounter for immunization: Secondary | ICD-10-CM

## 2019-07-22 NOTE — Progress Notes (Signed)
   [id number] Vaccination [facility]  Name:  Brad Jimenez.    MRN: [id number] DOB: July 14, 1970  07/22/2019  [name] was observed post Covid-19 immunization for 15 minutes without incident. He was provided with Vaccine Information Sheet and instruction to access the V-Safe system.   [name] was instructed to call 911 with any severe reactions post vaccine: Marland Kitchen Difficulty breathing  . Swelling of face and throat  . A fast heartbeat  . A bad rash all over body  . Dizziness and weakness   Immunizations Administered    Name Date Dose VIS Date Route   Pfizer COVID-19 Vaccine 07/22/2019  1:53 PM 0.3 mL 05/27/2018 Intramuscular   Manufacturer: [employer]   Lot: [id number]   [place]: [id number]

## 2019-08-10 ENCOUNTER — Other Ambulatory Visit: Payer: Self-pay

## 2019-08-10 ENCOUNTER — Ambulatory Visit (INDEPENDENT_AMBULATORY_CARE_PROVIDER_SITE_OTHER): Payer: Medicare HMO | Admitting: Family Medicine

## 2019-08-10 ENCOUNTER — Encounter: Payer: Self-pay | Admitting: Family Medicine

## 2019-08-10 DIAGNOSIS — E119 Type 2 diabetes mellitus without complications: Secondary | ICD-10-CM

## 2019-08-10 DIAGNOSIS — E7800 Pure hypercholesterolemia, unspecified: Secondary | ICD-10-CM | POA: Diagnosis not present

## 2019-08-10 DIAGNOSIS — E785 Hyperlipidemia, unspecified: Secondary | ICD-10-CM

## 2019-08-10 LAB — HEMOGLOBIN A1C: Hgb A1c MFr Bld: 7.3 % — ABNORMAL HIGH (ref 4.6–6.5)

## 2019-08-10 LAB — BASIC METABOLIC PANEL
BUN: 19 mg/dL (ref 6–23)
CO2: 25 mEq/L (ref 19–32)
Calcium: 9 mg/dL (ref 8.4–10.5)
Chloride: 98 mEq/L (ref 96–112)
Creatinine, Ser: 0.87 mg/dL (ref 0.40–1.50)
GFR: 92.91 mL/min (ref >60.00)
Glucose, Bld: 326 mg/dL — ABNORMAL HIGH (ref 70–99)
Potassium: 3.7 mEq/L (ref 3.5–5.1)
Sodium: 131 mEq/L — ABNORMAL LOW (ref 135–145)

## 2019-08-10 LAB — LIPID PANEL
Cholesterol: 125 mg/dL (ref 0–200)
HDL: 36.7 mg/dL — ABNORMAL LOW (ref >39.00)
NonHDL: 88.46
Total CHOL/HDL Ratio: 3
Triglycerides: 294 mg/dL — ABNORMAL HIGH (ref 0.0–149.0)
VLDL: 58.8 mg/dL — ABNORMAL HIGH (ref 0.0–40.0)

## 2019-08-10 LAB — LDL CHOLESTEROL, DIRECT: Direct LDL: 63 mg/dL

## 2019-08-10 MED ORDER — SERTRALINE HCL 50 MG PO TABS: 50.0000 mg | ORAL_TABLET | Freq: Every day | ORAL | 1 refills | Status: DC

## 2019-08-10 MED ORDER — JARDIANCE 25 MG PO TABS: 25.0000 mg | ORAL_TABLET | Freq: Every day | ORAL | 1 refills | Status: DC

## 2019-08-10 MED ORDER — ROSUVASTATIN CALCIUM 40 MG PO TABS: 40.0000 mg | ORAL_TABLET | Freq: Every day | ORAL | 1 refills | Status: DC

## 2019-08-10 MED ORDER — EZETIMIBE 10 MG PO TABS: 10.0000 mg | ORAL_TABLET | Freq: Every day | ORAL | 3 refills | Status: DC

## 2019-08-10 NOTE — Patient Instructions (Signed)
Nice to see you. We will get labs today and contact you with the results.  

## 2019-08-10 NOTE — Assessment & Plan Note (Signed)
Check A1c.  Continue Jardiance.  Encouraged to cut down on carbs.

## 2019-08-10 NOTE — Progress Notes (Signed)
  [name], MD Phone: [phone]  Brad Jimenez. is a 49 y.o. male who presents today for f/u.  DIABETES Disease Monitoring: Blood Sugar ranges-140 avg Polyuria/phagia/dipsia- no      Optho- UTD, we will request records Medications: Compliance- taking jardiance  Hypoglycemic symptoms- no Has not been eating very healthily.  Eating lots of carbs.  Hyperlipidemia: Taking Crestor and Zetia.  No chest pain, claudication, right upper quadrant pain, or myalgias.   Social History   Tobacco Use  Smoking Status Former Smoker  . Packs/day: 1.50  . Years: 22.00  . Pack years: 33.00  . Types: Cigarettes  . Quit date: 02/20/2011  . Years since quitting: 8.4  Smokeless Tobacco Never Used     ROS see history of present illness  Objective  Physical Exam Vitals:   08/10/19 1357  BP: 130/70  Pulse: (!) 101  Temp: (!) 97.4 F (36.3 C)  SpO2: 97%    BP Readings from Last 3 Encounters:  08/10/19 130/70  04/07/19 120/80  01/19/19 130/80   Wt Readings from Last 3 Encounters:  08/10/19 231 lb 9.6 oz (105.1 kg)  04/07/19 231 lb (104.8 kg)  01/19/19 226 lb 9.6 oz (102.8 kg)    Physical Exam Constitutional:      General: He is not in acute distress.    Appearance: He is not diaphoretic.  Cardiovascular:     Rate and Rhythm: Normal rate and regular rhythm.     Heart sounds: Normal heart sounds.  Pulmonary:     Effort: Pulmonary effort is normal.     Breath sounds: Normal breath sounds.  Musculoskeletal:     Right lower leg: No edema.     Left lower leg: No edema.  Skin:    General: Skin is warm and dry.  Neurological:     Mental Status: He is alert.      Assessment/Plan: Please see individual problem list.  Diabetes ([place]) Check A1c.  Continue Jardiance.  Encouraged to cut down on carbs.  Elevated LDL cholesterol level Continue Crestor and Zetia.  Check lipid panel.   Health Maintenance: requesting optho records.   Orders Placed This Encounter   Procedures  . HgB A1c  . Lipid panel  . Basic Metabolic Panel (BMET)    Meds ordered this encounter  Medications  . empagliflozin (JARDIANCE) 25 MG TABS tablet    Sig: Take 25 mg by mouth daily.    Dispense:  90 tablet    Refill:  1  . ezetimibe (ZETIA) 10 MG tablet    Sig: Take 1 tablet (10 mg total) by mouth daily.    Dispense:  90 tablet    Refill:  3  . rosuvastatin (CRESTOR) 40 MG tablet    Sig: Take 1 tablet (40 mg total) by mouth daily.    Dispense:  90 tablet    Refill:  1  . sertraline (ZOLOFT) 50 MG tablet    Sig: Take 1 tablet (50 mg total) by mouth daily.    Dispense:  90 tablet    Refill:  1    This visit occurred during the SARS-CoV-2 public health emergency.  Safety protocols were in place, including screening questions prior to the visit, additional usage of staff PPE, and extensive cleaning of exam room while observing appropriate contact time as indicated for disinfecting solutions.    [name], MD [place]

## 2019-08-10 NOTE — Assessment & Plan Note (Signed)
Continue Crestor and Zetia.  Check lipid panel.

## 2019-08-18 DIAGNOSIS — M654 Radial styloid tenosynovitis [de Quervain]: Secondary | ICD-10-CM | POA: Diagnosis not present

## 2019-09-23 ENCOUNTER — Ambulatory Visit (INDEPENDENT_AMBULATORY_CARE_PROVIDER_SITE_OTHER): Payer: Medicare HMO

## 2019-09-23 VITALS — Ht 68.0 in | Wt 231.0 lb

## 2019-09-23 DIAGNOSIS — Z0000 Encounter for general adult medical examination without abnormal findings: Secondary | ICD-10-CM | POA: Diagnosis not present

## 2019-09-23 NOTE — Progress Notes (Signed)
Subjective:   Brad Jimenez. is a 49 y.o. male who presents for Medicare Annual/Subsequent preventive examination.  Review of Systems    No ROS.  Medicare Wellness Virtual Visit.   Cardiac Risk Factors include: advanced age (>35men, >84 women);male gender;diabetes mellitus     Objective:    Today's Vitals   09/23/19 1135  Weight: 231 lb (104.8 kg)  Height: 5\' 8"  (1.727 m)   Body mass index is 35.12 kg/m.  Advanced Directives 09/23/2019 09/22/2018 09/17/2017 09/14/2016 04/07/2015 02/20/2015  Does Patient Have a Medical Advance Directive? No No No No No No  Would patient like information on creating a medical advance directive? No - Patient declined No - Patient declined Yes (MAU/Ambulatory/Procedural Areas - Information given) Yes (MAU/Ambulatory/Procedural Areas - Information given) No - patient declined information No - patient declined information    Current Medications (verified) Outpatient Encounter Medications as of 09/23/2019  Medication Sig   empagliflozin (JARDIANCE) 25 MG TABS tablet Take 25 mg by mouth daily.   ezetimibe (ZETIA) 10 MG tablet Take 1 tablet (10 mg total) by mouth daily.   glucose blood test strip TEST BLOOD SUGAR TWICE DAILY   ketoconazole (NIZORAL) 2 % cream Apply 1 application topically daily. For 6 weeks   Multiple Vitamin (MULTIVITAMIN WITH MINERALS) TABS tablet Take 1 tablet by mouth daily.   naproxen sodium (ALEVE) 220 MG tablet Take 2 tablets by mouth daily as needed.   RELION LANCETS STANDARD 21G MISC 1 Device by Does not apply route 2 (two) times daily.   rosuvastatin (CRESTOR) 40 MG tablet Take 1 tablet (40 mg total) by mouth daily.   sertraline (ZOLOFT) 50 MG tablet Take 1 tablet (50 mg total) by mouth daily.   No facility-administered encounter medications on file as of 09/23/2019.    Allergies (verified) Lipitor [atorvastatin calcium], Fentanyl, and Metformin and related   History: Past Medical History:  Diagnosis Date    Anxiety    Arthritis    Asthma    Chickenpox    COPD (chronic obstructive pulmonary disease) ([place])    Depression    Diabetes ([place])    History of blood transfusion    Low back pain    Past Surgical History:  Procedure Laterality Date   APPENDECTOMY  2002   BACK SURGERY  2001, 2002   CHOLECYSTECTOMY  2012   Family History  Problem Relation Age of Onset   Arthritis Mother    Lung cancer Mother    Heart disease Mother    Hypertension Mother    Diabetes Mother    Cancer Mother        Lung   Diabetes Sister    Tongue cancer Sister    Alcoholism Father    Stroke Father    Social History   Socioeconomic History   Marital status: Married    Spouse name: Not on file   Number of children: Not on file   Years of education: Not on file   Highest education level: Not on file  Occupational History   Not on file  Tobacco Use   Smoking status: Former Smoker    Packs/day: 1.50    Years: 22.00    Pack years: 33.00    Types: Cigarettes    Quit date: 02/20/2011    Years since quitting: 8.5   Smokeless tobacco: Never Used  Vaping Use   Vaping Use: Never used  Substance and Sexual Activity   Alcohol use: Yes    Alcohol/week: 2.0  standard drinks    Types: 2 Cans of beer per week    Comment: occasional   Drug use: No   Sexual activity: Yes  Other Topics Concern   Not on file  Social History Narrative   Not on file   Social Determinants of Health   Financial Resource Strain:    Difficulty of Paying Living Expenses:   Food Insecurity:    Worried About Charity fundraiser in the Last Year:    Arboriculturist in the Last Year:   Transportation Needs:    Film/video editor (Medical):    Lack of Transportation (Non-Medical):   Physical Activity:    Days of Exercise per Week:    Minutes of Exercise per Session:   Stress:    Feeling of Stress :   Social Connections:    Frequency of Communication with Friends and Family:     Frequency of Social Gatherings with Friends and Family:    Attends Religious Services:    Active Member of Clubs or Organizations:    Attends Music therapist:    Marital Status:     Tobacco Counseling Counseling given: Not Answered   Clinical Intake:  Pre-visit preparation completed: Yes        Diabetes: Yes (Followed by pcp)  How often do you need to have someone help you when you read instructions, pamphlets, or other written materials from your doctor or pharmacy?: 1 - Never  Interpreter Needed?: No      Activities of Daily Living In your present state of health, do you have any difficulty performing the following activities: 09/23/2019  Hearing? N  Vision? N  Difficulty concentrating or making decisions? N  Walking or climbing stairs? N  Dressing or bathing? N  Doing errands, shopping? N  Preparing Food and eating ? N  Using the Toilet? N  In the past six months, have you accidently leaked urine? N  Do you have problems with loss of bowel control? N  Managing your Medications? N  Managing your Finances? N  Housekeeping or managing your Housekeeping? N  Some recent data might be hidden    Patient Care Team: Leone Haven, MD as PCP - General (Family Medicine)  Indicate any recent Medical Services you may have received from other than [facility] providers in the past year (date may be approximate).     Assessment:   This is a routine wellness examination for Brad Jimenez.  I connected with Brad Jimenez today by telephone and verified that I am speaking with the correct person using two identifiers. Location patient: home Location provider: work Persons participating in the virtual visit: patient, Marine scientist.    I discussed the limitations, risks, security and privacy concerns of performing an evaluation and management service by telephone and the availability of in person appointments. The patient expressed understanding and verbally consented to this  telephonic visit.    Interactive audio and video telecommunications were attempted between this provider and patient, however failed, due to patient having technical difficulties OR patient did not have access to video capability.  We continued and completed visit with audio only.  Some vital signs may be absent or patient reported.   Hearing/Vision screen  Hearing Screening   125Hz  250Hz  500Hz  1000Hz  2000Hz  3000Hz  4000Hz  6000Hz  8000Hz   Right ear:           Left ear:           Comments: Patient is able to hear conversational  tones without difficulty.  No issues reported.  Vision Screening Comments: Recommended annual ophthalmology exams for early detection of glaucoma and other disorders of the eye.[facility]  Wears reader lenses Visual acuity not assessed, virtual visit.  They have seen their ophthalmologist in the last 12 months.   Dietary issues and exercise activities discussed: Current Exercise Habits: The patient does not participate in regular exercise at present  Goals      Patient Stated     Follow up with Primary Care Provider (pt-stated)      As needed.  Maintain healthy lifestyle      Depression Screen PHQ 2/9 Scores 09/23/2019 08/10/2019 04/07/2019 01/19/2019 09/22/2018 09/17/2017 09/14/2016  PHQ - 2 Score 0 0 0 0 0 0 0  PHQ- 9 Score - - - - - - -    Fall Risk Fall Risk  09/23/2019 08/10/2019 04/07/2019 01/19/2019 09/22/2018  Falls in the past year? 0 0 0 0 0  Number falls in past yr: 0 0 0 0 -  Follow up Falls evaluation completed Falls evaluation completed Falls evaluation completed Falls evaluation completed -    Handrails in use when climbing stair? Yes  Home free of loose throw rugs in walkways, pet beds, electrical cords, etc? Yes  Adequate lighting in your home to reduce risk of falls? Yes   ASSISTIVE DEVICES UTILIZED TO PREVENT FALLS:  Life alert? No  Use of a cane, walker or w/c? No  Grab bars in the bathroom? Yes  Shower chair or  bench in shower? No  Elevated toilet seat or a handicapped toilet? No   TIMED UP AND GO:  Was the test performed? No . Virtual visit.  Cognitive Function: MMSE - Mini Mental State Exam 09/17/2017 09/14/2016  Orientation to time 5 5  Orientation to Place 5 5  Registration 3 3  Attention/ Calculation 5 5  Recall 3 3  Language- name 2 objects 2 2  Language- repeat 1 1  Language- follow 3 step command 3 3  Language- read & follow direction 1 1  Write a sentence 1 1  Copy design 1 1  Total score 30 30     6CIT Screen 09/23/2019 09/22/2018  What Year? 0 points 0 points  What month? 0 points 0 points  What time? - 0 points  Count back from 20 - 0 points  Months in reverse 0 points 0 points  Repeat phrase 0 points 0 points  Total Score - 0    Immunizations Immunization History  Administered Date(s) Administered   PFIZER SARS-COV-2 Vaccination 06/29/2019, 07/22/2019   Pneumococcal Polysaccharide-23 06/07/2015   Tdap 06/07/2015   Screening Tests Health Maintenance  Topic Date Due   COLONOSCOPY  09/22/2020 (Originally 09/08/2019)   Hepatitis C Screening  09/22/2020 (Originally May 30, 1970)   INFLUENZA VACCINE  11/01/2019   FOOT EXAM  01/19/2020   URINE MICROALBUMIN  01/19/2020   HEMOGLOBIN A1C  02/10/2020   OPHTHALMOLOGY EXAM  06/16/2020   TETANUS/TDAP  06/06/2025   PNEUMOCOCCAL POLYSACCHARIDE VACCINE AGE 79-64 HIGH RISK  Completed   COVID-19 Vaccine  Completed   HIV Screening  Completed   Colonoscopy- declined. Plans to consult and follow up with PMD.   Hep C Screening- declined. Plans to consult and follow up with PMD.   Dental Screening: Recommended annual dental exams for proper oral hygiene  Community Resource Referral / Chronic Care Management: CRR required this visit?  No  CCM required this visit?  No  Plan:   Keep all routine maintenance appointments.   Follow up 12/21/19 @ 1:15  I have personally reviewed and noted the following in the  patients chart:    Medical and social history  Use of alcohol, tobacco or illicit drugs   Current medications and supplements  Functional ability and status  Nutritional status  Physical activity  Advanced directives  List of other physicians  Hospitalizations, surgeries, and ER visits in previous 12 months  Vitals  Screenings to include cognitive, depression, and falls  Referrals and appointments  In addition, I have reviewed and discussed with patient certain preventive protocols, quality metrics, and best practice recommendations. A written personalized care plan for preventive services as well as general preventive health recommendations were provided to patient via [place].     Varney Biles, LPN   12/04/147

## 2019-09-23 NOTE — Progress Notes (Signed)
I have reviewed the above note and agree.  Gardiner Espana, M.D.  

## 2019-09-23 NOTE — Patient Instructions (Addendum)
Brad Jimenez , Thank you for taking time to come for your Medicare Wellness Visit. I appreciate your ongoing commitment to your health goals. Please review the following plan we discussed and let me know if I can assist you in the future.   These are the goals we discussed: Goals      Patient Stated     Follow up with Primary Care Provider (pt-stated)      As needed.  Maintain healthy lifestyle       This is a list of the screening recommended for you and due dates:  Health Maintenance  Topic Date Due   Colon Cancer Screening  09/22/2020*    Hepatitis C: One time screening is recommended by Center for Disease Control  (CDC) for  adults born from [birth year] through 1966.   09/22/2020*   Flu Shot  11/01/2019   Complete foot exam   01/19/2020   Urine Protein Check  01/19/2020   Hemoglobin A1C  02/10/2020   Eye exam for diabetics  06/16/2020   Tetanus Vaccine  06/06/2025   Pneumococcal vaccine  Completed   COVID-19 Vaccine  Completed   HIV Screening  Completed  *Topic was postponed. The date shown is not the original due date.   Immunizations Immunization History  Administered Date(s) Administered   PFIZER SARS-COV-2 Vaccination 06/29/2019, 07/22/2019   Pneumococcal Polysaccharide-23 06/07/2015   Tdap 06/07/2015   Keep all routine maintenance appointments.   Follow up 12/21/19 @ 1:15  Advanced directives: declined  Conditions/risks identified: none new  Follow up in one year for your annual wellness visit   Preventive Care 40-64 Years, Male Preventive care refers to lifestyle choices and visits with your health care provider that can promote health and wellness. What does preventive care include?  A yearly physical exam. This is also called an annual well check.  Dental exams once or twice a year.  Routine eye exams. Ask your health care provider how often you should have your eyes checked.  Personal lifestyle choices, including:  Daily care of your teeth  and gums.  Regular physical activity.  Eating a healthy diet.  Avoiding tobacco and drug use.  Limiting alcohol use.  Practicing safe sex.  Taking low-dose aspirin every day starting at age 74. What happens during an annual well check? The services and screenings done by your health care provider during your annual well check will depend on your age, overall health, lifestyle risk factors, and family history of disease. Counseling  Your health care provider may ask you questions about your:  Alcohol use.  Tobacco use.  Drug use.  Emotional well-being.  Home and relationship well-being.  Sexual activity.  Eating habits.  Work and work Statistician. Screening  You may have the following tests or measurements:  Height, weight, and BMI.  Blood pressure.  Lipid and cholesterol levels. These may be checked every 5 years, or more frequently if you are over 13 years old.  Skin check.  Lung cancer screening. You may have this screening every year starting at age 40 if you have a 30-pack-year history of smoking and currently smoke or have quit within the past 15 years.  Fecal occult blood test (FOBT) of the stool. You may have this test every year starting at age 57.  Flexible sigmoidoscopy or colonoscopy. You may have a sigmoidoscopy every 5 years or a colonoscopy every 10 years starting at age 51.  Prostate cancer screening. Recommendations will vary depending on your family history and other  risks.  Hepatitis C blood test.  Hepatitis B blood test.  Sexually transmitted disease (STD) testing.  Diabetes screening. This is done by checking your blood sugar (glucose) after you have not eaten for a while (fasting). You may have this done every 1-3 years. Discuss your test results, treatment options, and if necessary, the need for more tests with your health care provider. Vaccines  Your health care provider may recommend certain vaccines, such as:  Influenza vaccine.  This is recommended every year.  Tetanus, diphtheria, and acellular pertussis (Tdap, Td) vaccine. You may need a Td booster every 10 years.  Zoster vaccine. You may need this after age 61.  Pneumococcal 13-valent conjugate (PCV13) vaccine. You may need this if you have certain conditions and have not been vaccinated.  Pneumococcal polysaccharide (PPSV23) vaccine. You may need one or two doses if you smoke cigarettes or if you have certain conditions. Talk to your health care provider about which screenings and vaccines you need and how often you need them. This information is not intended to replace advice given to you by your health care provider. Make sure you discuss any questions you have with your health care provider. Document Released: 04/15/2015 Document Revised: 12/07/2015 Document Reviewed: 01/18/2015 Elsevier Interactive Patient Education  2017 [employer] Prevention in the Home Falls can cause injuries. They can happen to people of all ages. There are many things you can do to make your home safe and to help prevent falls. What can I do on the outside of my home?  Regularly fix the edges of walkways and driveways and fix any cracks.  Remove anything that might make you trip as you walk through a door, such as a raised step or threshold.  Trim any bushes or trees on the path to your home.  Use bright outdoor lighting.  Clear any walking paths of anything that might make someone trip, such as rocks or tools.  Regularly check to see if handrails are loose or broken. Make sure that both sides of any steps have handrails.  Any raised decks and porches should have guardrails on the edges.  Have any leaves, snow, or ice cleared regularly.  Use sand or salt on walking paths during winter.  Clean up any spills in your garage right away. This includes oil or grease spills. What can I do in the bathroom?  Use night lights.  Install grab bars by the toilet and in the  tub and shower. Do not use towel bars as grab bars.  Use non-skid mats or decals in the tub or shower.  If you need to sit down in the shower, use a plastic, non-slip stool.  Keep the floor dry. Clean up any water that spills on the floor as soon as it happens.  Remove soap buildup in the tub or shower regularly.  Attach bath mats securely with double-sided non-slip rug tape.  Do not have throw rugs and other things on the floor that can make you trip. What can I do in the bedroom?  Use night lights.  Make sure that you have a light by your bed that is easy to reach.  Do not use any sheets or blankets that are too big for your bed. They should not hang down onto the floor.  Have a firm chair that has side arms. You can use this for support while you get dressed.  Do not have throw rugs and other things on the floor that can  make you trip. What can I do in the kitchen?  Clean up any spills right away.  Avoid walking on wet floors.  Keep items that you use a lot in easy-to-reach places.  If you need to reach something above you, use a strong step stool that has a grab bar.  Keep electrical cords out of the way.  Do not use floor polish or wax that makes floors slippery. If you must use wax, use non-skid floor wax.  Do not have throw rugs and other things on the floor that can make you trip. What can I do with my stairs?  Do not leave any items on the stairs.  Make sure that there are handrails on both sides of the stairs and use them. Fix handrails that are broken or loose. Make sure that handrails are as long as the stairways.  Check any carpeting to make sure that it is firmly attached to the stairs. Fix any carpet that is loose or worn.  Avoid having throw rugs at the top or bottom of the stairs. If you do have throw rugs, attach them to the floor with carpet tape.  Make sure that you have a light switch at the top of the stairs and the bottom of the stairs. If you  do not have them, ask someone to add them for you. What else can I do to help prevent falls?  Wear shoes that:  Do not have high heels.  Have rubber bottoms.  Are comfortable and fit you well.  Are closed at the toe. Do not wear sandals.  If you use a stepladder:  Make sure that it is fully opened. Do not climb a closed stepladder.  Make sure that both sides of the stepladder are locked into place.  Ask someone to hold it for you, if possible.  Clearly mark and make sure that you can see:  Any grab bars or handrails.  First and last steps.  Where the edge of each step is.  Use tools that help you move around (mobility aids) if they are needed. These include:  Canes.  Walkers.  Scooters.  Crutches.  Turn on the lights when you go into a dark area. Replace any light bulbs as soon as they burn out.  Set up your furniture so you have a clear path. Avoid moving your furniture around.  If any of your floors are uneven, fix them.  If there are any pets around you, be aware of where they are.  Review your medicines with your doctor. Some medicines can make you feel dizzy. This can increase your chance of falling. Ask your doctor what other things that you can do to help prevent falls. This information is not intended to replace advice given to you by your health care provider. Make sure you discuss any questions you have with your health care provider. Document Released: 01/13/2009 Document Revised: 08/25/2015 Document Reviewed: 04/23/2014 Elsevier Interactive Patient Education  2017 [employer].

## 2019-12-21 ENCOUNTER — Ambulatory Visit: Payer: Medicare HMO | Admitting: Family Medicine

## 2020-01-11 ENCOUNTER — Other Ambulatory Visit: Payer: Self-pay | Admitting: Family Medicine

## 2020-01-11 DIAGNOSIS — E785 Hyperlipidemia, unspecified: Secondary | ICD-10-CM

## 2020-02-03 ENCOUNTER — Other Ambulatory Visit: Payer: Self-pay

## 2020-02-03 ENCOUNTER — Encounter: Payer: Self-pay | Admitting: Family Medicine

## 2020-02-03 ENCOUNTER — Ambulatory Visit (INDEPENDENT_AMBULATORY_CARE_PROVIDER_SITE_OTHER): Payer: Medicare HMO | Admitting: Family Medicine

## 2020-02-03 VITALS — BP 120/80 | HR 75 | Temp 97.8°F | Ht 68.0 in | Wt 229.2 lb

## 2020-02-03 DIAGNOSIS — M25529 Pain in unspecified elbow: Secondary | ICD-10-CM | POA: Insufficient documentation

## 2020-02-03 DIAGNOSIS — E119 Type 2 diabetes mellitus without complications: Secondary | ICD-10-CM | POA: Diagnosis not present

## 2020-02-03 DIAGNOSIS — M25521 Pain in right elbow: Secondary | ICD-10-CM

## 2020-02-03 DIAGNOSIS — F32A Depression, unspecified: Secondary | ICD-10-CM

## 2020-02-03 DIAGNOSIS — F419 Anxiety disorder, unspecified: Secondary | ICD-10-CM | POA: Diagnosis not present

## 2020-02-03 DIAGNOSIS — E7800 Pure hypercholesterolemia, unspecified: Secondary | ICD-10-CM | POA: Diagnosis not present

## 2020-02-03 MED ORDER — EMPAGLIFLOZIN 25 MG PO TABS: 25.0000 mg | ORAL_TABLET | Freq: Every day | ORAL | 0 refills | Status: DC

## 2020-02-03 MED ORDER — BLOOD GLUCOSE MONITOR KIT: PACK | 0 refills | Status: DC

## 2020-02-03 NOTE — Assessment & Plan Note (Signed)
Continue Crestor 40 mg once daily and Zetia 10 mg once daily. 

## 2020-02-03 NOTE — Patient Instructions (Signed)
Nice to see you. We will get labs. Please try to avoid sugary foods. Please try icing your elbow for up to 10 minutes 2 times a day.  You can also try a tennis elbow brace.

## 2020-02-03 NOTE — Progress Notes (Signed)
Brad Rumps, MD Phone: [phone]  Brad Jimenez. is a 49 y.o. male who presents today for f/u.  DIABETES Disease Monitoring: Blood Sugar ranges-160-180s Polyuria/phagia/dipsia- no      Optho- UTD Medications: Compliance- taking jardiance  Hypoglycemic symptoms- no  HYPERLIPIDEMIA Symptoms Chest pain on exertion:  no    Medications: Compliance- taking zetia, crestor Right upper quadrant pain- no  Muscle aches- no  Bilateral elbow pain: Patient notes this is sporadic.  His left elbow and forearm hurt at times after he has been massaging his wife's back. he notes his right elbow hurts around the lateral epicondyle.  Neither of them hurt consistently.  No specific injuries.   Anxiety/depression: Denies any symptoms.  Taking Zoloft.  He is not ready to taper off of this as his daughter just moved back into his house.      Social History   Tobacco Use  Smoking Status Former Smoker  . Packs/day: 1.50  . Years: 22.00  . Pack years: 33.00  . Types: Cigarettes  . Quit date: 02/20/2011  . Years since quitting: 8.9  Smokeless Tobacco Never Used     ROS see history of present illness  Objective  Physical Exam Vitals:   02/03/20 1453  BP: 120/80  Pulse: 75  Temp: 97.8 F (36.6 C)  SpO2: 98%    BP Readings from Last 3 Encounters:  02/03/20 120/80  08/10/19 130/70  04/07/19 120/80   Wt Readings from Last 3 Encounters:  02/03/20 229 lb 3.2 oz (104 kg)  09/23/19 231 lb (104.8 kg)  08/10/19 231 lb 9.6 oz (105.1 kg)    Physical Exam Constitutional:      General: He is not in acute distress.    Appearance: He is not diaphoretic.  Cardiovascular:     Rate and Rhythm: Normal rate and regular rhythm.     Heart sounds: Normal heart sounds.  Pulmonary:     Effort: Pulmonary effort is normal.     Breath sounds: Normal breath sounds.  Musculoskeletal:     Comments: Left forearm and elbow with no tenderness or swelling, right elbow with slight discomfort at the  lateral epicondyles  Skin:    General: Skin is warm and dry.  Neurological:     Mental Status: He is alert.      Assessment/Plan: Please see individual problem list.  Problem List Items Addressed This Visit    Anxiety and depression    Asymptomatic.  Patient opts to continue Zoloft for now.  Consider tapering off of this in the future.      Diabetes ([place]) - Primary    Uncontrolled.  Check A1c.  Continue Jardiance 25 mg once daily.  Sample given for 1 week as he is in the donut hole.  Discussed working on his diet.  Discussed potential for adding additional medication based on his A1c result.      Relevant Medications   empagliflozin (JARDIANCE) 25 MG TABS tablet   blood glucose meter kit and supplies KIT   Other Relevant Orders   Basic Metabolic Panel (BMET)   HgB A1c   Urine Microalbumin w/creat. ratio   Elbow pain    Suspect tennis elbow in the right elbow.  Discussed icing 10 minutes twice daily and buying a brace for this.  If not improving he will let us know.  Likely muscle strain in left arm.  Will monitor.      Elevated LDL cholesterol level    Continue Crestor 40 mg once  daily and Zetia 10 mg once daily.          This visit occurred during the SARS-CoV-2 public health emergency.  Safety protocols were in place, including screening questions prior to the visit, additional usage of staff PPE, and extensive cleaning of exam room while observing appropriate contact time as indicated for disinfecting solutions.    Brad Rumps, MD [place]

## 2020-02-03 NOTE — Assessment & Plan Note (Signed)
Suspect tennis elbow in the right elbow.  Discussed icing 10 minutes twice daily and buying a brace for this.  If not improving he will let us know.  Likely muscle strain in left arm.  Will monitor.

## 2020-02-03 NOTE — Assessment & Plan Note (Signed)
Asymptomatic.  Patient opts to continue Zoloft for now.  Consider tapering off of this in the future.

## 2020-02-03 NOTE — Assessment & Plan Note (Signed)
Uncontrolled.  Check A1c.  Continue Jardiance 25 mg once daily.  Sample given for 1 week as he is in the donut hole.  Discussed working on his diet.  Discussed potential for adding additional medication based on his A1c result.

## 2020-02-04 LAB — BASIC METABOLIC PANEL
BUN: 15 mg/dL (ref 6–23)
CO2: 25 mEq/L (ref 19–32)
Calcium: 9.1 mg/dL (ref 8.4–10.5)
Chloride: 99 mEq/L (ref 96–112)
Creatinine, Ser: 0.78 mg/dL (ref 0.40–1.50)
GFR: 104.06 mL/min (ref >60.00)
Glucose, Bld: 324 mg/dL — ABNORMAL HIGH (ref 70–99)
Potassium: 3.9 mEq/L (ref 3.5–5.1)
Sodium: 135 mEq/L (ref 135–145)

## 2020-02-04 LAB — MICROALBUMIN / CREATININE URINE RATIO
Creatinine,U: 35.9 mg/dL
Microalb Creat Ratio: 3.8 mg/g (ref 0.0–30.0)
Microalb, Ur: 1.4 mg/dL (ref 0.0–1.9)

## 2020-02-04 LAB — HEMOGLOBIN A1C: Hgb A1c MFr Bld: 8.9 % — ABNORMAL HIGH (ref 4.6–6.5)

## 2020-02-11 ENCOUNTER — Other Ambulatory Visit: Payer: Self-pay | Admitting: Family Medicine

## 2020-02-11 MED ORDER — OZEMPIC (0.25 OR 0.5 MG/DOSE) 2 MG/1.5ML ~~LOC~~ SOPN: PEN_INJECTOR | SUBCUTANEOUS | 1 refills | Status: AC

## 2020-04-12 ENCOUNTER — Other Ambulatory Visit: Payer: Self-pay | Admitting: Family Medicine

## 2020-04-12 DIAGNOSIS — E119 Type 2 diabetes mellitus without complications: Secondary | ICD-10-CM

## 2020-04-13 ENCOUNTER — Other Ambulatory Visit: Payer: Self-pay | Admitting: Family Medicine

## 2020-04-13 DIAGNOSIS — E785 Hyperlipidemia, unspecified: Secondary | ICD-10-CM

## 2020-04-25 ENCOUNTER — Telehealth: Payer: Self-pay

## 2020-04-25 NOTE — Telephone Encounter (Signed)
Pt wants a 10 day sample of Jardiance. He said he is in a loop hole with the pharmacy because he has to pay out of pocket and it is now $85 and he has to wait until 05/03/20 for his pay check so he can get a refill. He is asking for a call back. He just needs enough to get him by.

## 2020-04-26 MED ORDER — EMPAGLIFLOZIN 25 MG PO TABS: 25.0000 mg | ORAL_TABLET | Freq: Every day | ORAL | 0 refills | Status: DC

## 2020-04-26 NOTE — Telephone Encounter (Signed)
Noted. Thank you for preparing this for him.

## 2020-04-26 NOTE — Telephone Encounter (Signed)
Called to speak with Brad Jimenez and ask for clarification on what he requested. Left voicemail to call back.

## 2020-04-26 NOTE — Telephone Encounter (Signed)
Brad Jimenez returned the call. Brad Jimenez does not want a 10 day prescription sent in. Brad Jimenez is being charged 85 dollars a month for his medication and has a prescription set to come in on February 1st. Brad Jimenez asks if we have any free Brad Jimenez here in office that Brad Jimenez can receive.

## 2020-04-26 NOTE — Telephone Encounter (Signed)
Medication Samples have been provided to the patient.  Drug name: Jardiance       Strength: 25mg         Qty: 2  LOT: [id number]  Exp.Date: 12/01/2021  Dosing instructions: TAKE 1 TABLET EVERY DAY  The patient has been instructed regarding the correct time, dose, and frequency of taking this medication, including desired effects and most common side effects.   Aradhana Gin Zoila Shutter 2:18 PM 04/26/2020   Called and spoke to [employer]. Haden states that he will come in on 04/27/2020 to pick up the samples of Jardiance.

## 2020-04-26 NOTE — Telephone Encounter (Signed)
We do have Jardiance 25 mg in stock. Key is on the corkboard at my desk, binder is in the cabinet above my desk to the right. You can prepare 2 weeks of samples (2 boxes). Please label and log in the book as per policy.

## 2020-04-26 NOTE — Addendum Note (Signed)
Addended by: Leone Haven on: 04/26/2020 03:39 PM   Modules accepted: Orders

## 2020-04-26 NOTE — Telephone Encounter (Signed)
Brad Jimenez has been placed up front for patient pick up.

## 2020-04-27 NOTE — Telephone Encounter (Signed)
Pt Came in to pick up medication. Brad Jimenez recieved both boxes of Jardiance.

## 2020-04-30 DIAGNOSIS — Z20822 Contact with and (suspected) exposure to covid-19: Secondary | ICD-10-CM | POA: Diagnosis not present

## 2020-05-06 ENCOUNTER — Ambulatory Visit: Payer: Medicare HMO | Admitting: Family Medicine

## 2020-06-02 ENCOUNTER — Other Ambulatory Visit: Payer: Self-pay

## 2020-06-06 ENCOUNTER — Other Ambulatory Visit: Payer: Self-pay

## 2020-06-06 ENCOUNTER — Encounter: Payer: Self-pay | Admitting: Family Medicine

## 2020-06-06 ENCOUNTER — Ambulatory Visit (INDEPENDENT_AMBULATORY_CARE_PROVIDER_SITE_OTHER): Payer: Medicare HMO | Admitting: Family Medicine

## 2020-06-06 VITALS — BP 120/70 | HR 84 | Temp 97.9°F | Ht 68.0 in | Wt 219.0 lb

## 2020-06-06 DIAGNOSIS — E7800 Pure hypercholesterolemia, unspecified: Secondary | ICD-10-CM

## 2020-06-06 DIAGNOSIS — E119 Type 2 diabetes mellitus without complications: Secondary | ICD-10-CM | POA: Diagnosis not present

## 2020-06-06 DIAGNOSIS — R1011 Right upper quadrant pain: Secondary | ICD-10-CM

## 2020-06-06 NOTE — Progress Notes (Signed)
Brad Rumps, MD Phone: [phone]  Brad Jimenez. is a 50 y.o. male who presents today for f/u.  DIABETES Disease Monitoring: Blood Sugar ranges-around 140 polyuria/phagia/dipsia-no      Optho-up-to-date Medications: Compliance-taking Ozempic 0.5 mg once weekly, Jardiance hypoglycemic symptoms-no Does report some bilateral side pain that has been going on with the Ozempic.  Occurs the day he takes it in the next day and then resolves.  No epigastric pain.  No injury.  Hyperlipidemia: Taking Zetia and Crestor.  No chest pain.  No myalgias.    Social History   Tobacco Use  Smoking Status Former Smoker  . Packs/day: 1.50  . Years: 22.00  . Pack years: 33.00  . Types: Cigarettes  . Quit date: 02/20/2011  . Years since quitting: 9.3  Smokeless Tobacco Never Used    Current Outpatient Medications on File Prior to Visit  Medication Sig Dispense Refill  . blood glucose meter kit and supplies KIT Dispense based on patient and insurance preference. Use once daily as directed. (FOR ICD-10 E11.9). 1 each 0  . empagliflozin (JARDIANCE) 25 MG TABS tablet Take 1 tablet (25 mg total) by mouth daily before breakfast. 14 tablet 0  . JARDIANCE 25 MG TABS tablet TAKE 1 TABLET EVERY DAY 30 tablet 2  . ketoconazole (NIZORAL) 2 % cream Apply 1 application topically daily. For 6 weeks 30 g 0  . Multiple Vitamin (MULTIVITAMIN WITH MINERALS) TABS tablet Take 1 tablet by mouth daily.    . naproxen sodium (ALEVE) 220 MG tablet Take 2 tablets by mouth daily as needed.    [place] Kitchen RELION LANCETS STANDARD 21G MISC 1 Device by Does not apply route 2 (two) times daily. 200 each 2  . rosuvastatin (CRESTOR) 40 MG tablet TAKE 1 TABLET EVERY DAY 90 tablet 1  . sertraline (ZOLOFT) 50 MG tablet TAKE 1 TABLET EVERY DAY 90 tablet 1  . TRUE METRIX BLOOD GLUCOSE TEST test strip TEST ONE TIME DAILY AS DIRECTED 100 strip 3   No current facility-administered medications on file prior to visit.     ROS see history  of present illness  Objective  Physical Exam Vitals:   06/06/20 1621  BP: 120/70  Pulse: 84  Temp: 97.9 F (36.6 C)  SpO2: 96%    BP Readings from Last 3 Encounters:  06/06/20 120/70  02/03/20 120/80  08/10/19 130/70   Wt Readings from Last 3 Encounters:  06/06/20 219 lb (99.3 kg)  02/03/20 229 lb 3.2 oz (104 kg)  09/23/19 231 lb (104.8 kg)    Physical Exam Constitutional:      General: He is not in acute distress.    Appearance: He is not diaphoretic.  Cardiovascular:     Rate and Rhythm: Normal rate and regular rhythm.     Heart sounds: Normal heart sounds.  Pulmonary:     Effort: Pulmonary effort is normal.     Breath sounds: Normal breath sounds.  Abdominal:     General: Bowel sounds are normal. There is no distension.     Palpations: Abdomen is soft.     Tenderness: There is no abdominal tenderness. There is no guarding or rebound.  Musculoskeletal:     Right lower leg: No edema.     Left lower leg: No edema.  Skin:    General: Skin is warm and dry.  Neurological:     Mental Status: He is alert.    Diabetic Foot Exam - Simple   Simple Foot Form Diabetic Foot  exam was performed with the following findings: Yes 06/06/2020  4:32 PM  Visual Inspection No deformities, no ulcerations, no other skin breakdown bilaterally: Yes Sensation Testing Intact to touch and monofilament testing bilaterally: Yes Pulse Check Posterior Tibialis and Dorsalis pulse intact bilaterally: Yes Comments      Assessment/Plan: Please see individual problem list.  Problem List Items Addressed This Visit    Diabetes (Brad Jimenez)    Check A1c.  Continue Jardiance and Ozempic.      Relevant Orders   HgB A1c (Completed)   AMB Referral to [facility]   Elevated LDL cholesterol level    He will continue Zetia and Crestor.      RUQ pain - Primary    Patient with right and left upper quadrant pain for 2 days after taking Ozempic.  Could be related to that.  We will  check hepatic function panel.  Will converse with our clinical pharmacist regarding this potential side effect.      Relevant Orders   Hepatic function panel (Completed)      This visit occurred during the SARS-CoV-2 public health emergency.  Safety protocols were in place, including screening questions prior to the visit, additional usage of staff PPE, and extensive cleaning of exam room while observing appropriate contact time as indicated for disinfecting solutions.    Brad Rumps, MD [place]

## 2020-06-06 NOTE — Patient Instructions (Signed)
Nice to see you. We will get labs today. We will get you set up with our clinical pharmacist.

## 2020-06-07 LAB — HEPATIC FUNCTION PANEL
ALT: 50 U/L (ref 0–53)
AST: 29 U/L (ref 0–37)
Albumin: 4.6 g/dL (ref 3.5–5.2)
Alkaline Phosphatase: 83 U/L (ref 39–117)
Bilirubin, Direct: 0.1 mg/dL (ref 0.0–0.3)
Total Bilirubin: 0.4 mg/dL (ref 0.2–1.2)
Total Protein: 7.8 g/dL (ref 6.0–8.3)

## 2020-06-07 LAB — HEMOGLOBIN A1C: Hgb A1c MFr Bld: 7.3 % — ABNORMAL HIGH (ref 4.6–6.5)

## 2020-06-08 ENCOUNTER — Other Ambulatory Visit: Payer: Self-pay | Admitting: Family Medicine

## 2020-06-08 DIAGNOSIS — E785 Hyperlipidemia, unspecified: Secondary | ICD-10-CM

## 2020-06-09 DIAGNOSIS — R1011 Right upper quadrant pain: Secondary | ICD-10-CM | POA: Insufficient documentation

## 2020-06-09 NOTE — Assessment & Plan Note (Signed)
Check A1c.  Continue Jardiance and Ozempic.

## 2020-06-09 NOTE — Assessment & Plan Note (Signed)
Patient with right and left upper quadrant pain for 2 days after taking Ozempic.  Could be related to that.  We will check hepatic function panel.  Will converse with our clinical pharmacist regarding this potential side effect.

## 2020-06-09 NOTE — Assessment & Plan Note (Signed)
He will continue Zetia and Crestor.

## 2020-06-10 ENCOUNTER — Telehealth: Payer: Self-pay

## 2020-06-10 NOTE — Chronic Care Management (AMB) (Signed)
  Chronic Care Management   Note  06/10/2020 Name: Brad Jimenez. MRN: [id number] DOB: [birth year]  Brad Jimenez. is a 50 y.o. year old male who is a primary care patient of Caryl Bis Angela Adam, MD. I reached out to Brad Jimenez. by phone today in response to a referral sent by Mr. KHAIR CHASTEEN Jr.'s PCP, Leone Haven, MD  [name] was given information about Chronic Care Management services today including:  1. CCM service includes personalized support from designated clinical staff supervised by his physician, including individualized plan of care and coordination with other care providers 2. 24/7 contact phone numbers for assistance for urgent and routine care needs. 3. Service will only be billed when office clinical staff spend 20 minutes or more in a month to coordinate care. 4. Only one practitioner may furnish and bill the service in a calendar month. 5. The patient may stop CCM services at any time (effective at the end of the month) by phone call to the office staff. 6. The patient will be responsible for cost sharing (co-pay) of up to 20% of the service fee (after annual deductible is met).  Patient agreed to services and verbal consent obtained.   Follow up plan: Telephone appointment with care management team member scheduled for:07/07/2020  Noreene Larsson, [address], [place], [place] [address] Direct Dial: [phone] [email or web address]

## 2020-06-11 ENCOUNTER — Telehealth: Payer: Self-pay | Admitting: Family Medicine

## 2020-06-11 NOTE — Telephone Encounter (Signed)
Please let the patient know I conferred with our clinical pharmacist.  We both think that we should discontinue the Ozempic given the pain he has been having.  We could potentially try Trulicity and see if he is able to tolerate that better.  Would he be willing to do that?

## 2020-06-11 NOTE — Telephone Encounter (Signed)
-----   Message from De Hollingshead, [place] sent at 06/10/2020 10:57 AM EST ----- Demetrios Isaacs, I would say that due to the time course, it probably is the Ozempic. It is "pain", it isn't "fullness" or "nausea"? If pain and impacting quality of life, I agree, d/c Ozempic.   Could consider Trulicity? In my experience, less potent but possibly better tolerated? We have plenty of samples he could try before paying.   Catie ----- Message ----- From: Leone Haven, MD Sent: 06/09/2020   9:41 AM EST To: De Hollingshead, RPH-CPP  I placed a referral for this patient to see you for medication assistance.  He has been having right and left side pain the day he takes the Ozempic and the day after though it resolves after that.  Given the time course this pain is likely related to the [place] though the location does not seem consistent with a pancreatic issue.  I did check a hepatic function panel to evaluate that.  With this potential side effect to using we should have him continue on the Ozempic?

## 2020-06-13 NOTE — Telephone Encounter (Signed)
Called and VM not set up.  Miya Luviano,cma

## 2020-06-14 NOTE — Telephone Encounter (Signed)
I called and LVM for the patient to call back. Brad Jimenez,cma   

## 2020-06-15 ENCOUNTER — Telehealth: Payer: Self-pay

## 2020-06-15 NOTE — Telephone Encounter (Signed)
LVM for the patient to call back. Nina,cma  

## 2020-06-15 NOTE — Telephone Encounter (Signed)
I called the patient to inform him that the provider wants him to stop taking the Ozempic, given the pain he has been having.  We could potentially try Trulicity and see if he is able to tolerate that better.  Would he be willing to do that?  Nina,cma

## 2020-06-16 NOTE — Telephone Encounter (Signed)
Could not reach patient by phone,  Message sent through mychart.  Nina,cma

## 2020-07-07 ENCOUNTER — Ambulatory Visit (INDEPENDENT_AMBULATORY_CARE_PROVIDER_SITE_OTHER): Payer: Medicare HMO | Admitting: Pharmacist

## 2020-07-07 DIAGNOSIS — E785 Hyperlipidemia, unspecified: Secondary | ICD-10-CM

## 2020-07-07 DIAGNOSIS — E119 Type 2 diabetes mellitus without complications: Secondary | ICD-10-CM

## 2020-07-07 DIAGNOSIS — G8929 Other chronic pain: Secondary | ICD-10-CM

## 2020-07-07 DIAGNOSIS — F32A Depression, unspecified: Secondary | ICD-10-CM | POA: Diagnosis not present

## 2020-07-07 DIAGNOSIS — F419 Anxiety disorder, unspecified: Secondary | ICD-10-CM | POA: Diagnosis not present

## 2020-07-07 NOTE — Chronic Care Management (AMB) (Signed)
Chronic Care Management Pharmacy Note  07/07/2020 Name:  Brad Jimenez. MRN:  [id number] DOB:  06-12-1970  Subjective: Brad Jimenez. is an 50 y.o. year old male who is a primary patient of Caryl Bis, Angela Adam, MD.  The CCM team was consulted for assistance with disease management and care coordination needs.    Engaged with patient by telephone for initial visit in response to provider referral for pharmacy case management and/or care coordination services.   Consent to Services:  The patient was given the following information about Chronic Care Management services today, agreed to services, and gave verbal consent: 1. CCM service includes personalized support from designated clinical staff supervised by the primary care provider, including individualized plan of care and coordination with other care providers 2. 24/7 contact phone numbers for assistance for urgent and routine care needs. 3. Service will only be billed when office clinical staff spend 20 minutes or more in a month to coordinate care. 4. Only one practitioner may furnish and bill the service in a calendar month. 5.The patient may stop CCM services at any time (effective at the end of the month) by phone call to the office staff. 6. The patient will be responsible for cost sharing (co-pay) of up to 20% of the service fee (after annual deductible is met). Patient agreed to services and consent obtained.  Patient Care Team: Leone Haven, MD as PCP - General (Family Medicine) De Hollingshead, RPH-CPP (Pharmacist)  Recent office visits:  3/7 - PCP f/u - abdominal pain after Ozempic. Consider switch to Trulicity. Cost concerns.   Recent consult visits: None recently  Hospital visits: None in previous 6 months  Objective:  Lab Results  Component Value Date   CREATININE 0.78 02/03/2020   CREATININE 0.87 08/10/2019   CREATININE 0.84 04/28/2019    Lab Results  Component Value Date   HGBA1C 7.3 (H)  06/06/2020   Last diabetic Eye exam:  Lab Results  Component Value Date/Time   HMDIABEYEEXA No Retinopathy 02/18/2017 12:00 AM    Last diabetic Foot exam: No results found for: HMDIABFOOTEX      Component Value Date/Time   CHOL 125 08/10/2019 1410   TRIG 294.0 (H) 08/10/2019 1410   HDL 36.70 (L) 08/10/2019 1410   CHOLHDL 3 08/10/2019 1410   VLDL 58.8 (H) 08/10/2019 1410   LDLCALC 65 05/08/2016 1046   LDLDIRECT 63.0 08/10/2019 1410    Hepatic Function Latest Ref Rng & Units 06/06/2020 04/28/2019 04/28/2019  Total Protein 6.0 - 8.3 g/dL 7.8 7.5 7.5  Albumin 3.5 - 5.2 g/dL 4.6 4.7 4.7  AST 0 - 37 U/L 29 23 23   ALT 0 - 53 U/L 50 46 46  Alk Phosphatase 39 - 117 U/L 83 86 86  Total Bilirubin 0.2 - 1.2 mg/dL 0.4 0.6 0.6  Bilirubin, Direct 0.0 - 0.3 mg/dL 0.1 0.1 -      CBC Latest Ref Rng & Units 02/20/2015  WBC 3.8 - 10.6 K/uL 9.4  Hemoglobin 13.0 - 18.0 g/dL 15.3  Hematocrit 40.0 - 52.0 % 43.0  Platelets 150 - 440 K/uL 289     Clinical ASCVD: No    Social History   Tobacco Use  Smoking Status Former Smoker  . Packs/day: 1.50  . Years: 22.00  . Pack years: 33.00  . Types: Cigarettes  . Quit date: 02/20/2011  . Years since quitting: 9.3  Smokeless Tobacco Never Used   BP Readings from Last 3 Encounters:  06/06/20 120/70  02/03/20 120/80  08/10/19 130/70   Pulse Readings from Last 3 Encounters:  06/06/20 84  02/03/20 75  08/10/19 (!) 101   Wt Readings from Last 3 Encounters:  06/06/20 219 lb (99.3 kg)  02/03/20 229 lb 3.2 oz (104 kg)  09/23/19 231 lb (104.8 kg)    Assessment: Review of patient past medical history, allergies, medications, health status, including review of consultants reports, laboratory and other test data, was performed as part of comprehensive evaluation and provision of chronic care management services.   SDOH:  (Social Determinants of Health) assessments and interventions performed:  SDOH Interventions   Flowsheet Row Most Recent  Value  SDOH Interventions   Financial Strain Interventions Other (Comment)  [manufacturer assistance]      CCM Care Plan  Allergies  Allergen Reactions  . Lipitor [Atorvastatin Calcium]     Severe muscle aches and pain.  . Fentanyl Other (See Comments)    Hyperactivity  . Metformin And Related Other (See Comments)    Blurry vision    Medications Reviewed Today    Reviewed by De Hollingshead, RPH-CPP (Pharmacist) on 07/07/20 at 1323  Med List Status: <None>  Medication Order Taking? Sig Documenting Provider Last Dose Status Informant  blood glucose meter kit and supplies KIT [id number] No Dispense based on patient and insurance preference. Use once daily as directed. (FOR ICD-10 E11.9).  Patient not taking: Reported on 07/07/2020   Leone Haven, MD Not Taking Active   ezetimibe (ZETIA) 10 MG tablet [id number] Yes TAKE 1 TABLET EVERY DAY Leone Haven, MD Taking Active   JARDIANCE 25 MG TABS tablet [id number] Yes TAKE 1 TABLET EVERY DAY Leone Haven, MD Taking Active   Multiple Vitamin (MULTIVITAMIN WITH MINERALS) TABS tablet [id number] Yes Take 1 tablet by mouth daily. [provider] Taking Active   naproxen sodium (ALEVE) 220 MG tablet [id number] Yes Take 2 tablets by mouth daily as needed. [provider] Taking Active            Med Note Darnelle Maffucci, Kursten Kruk E   Thu Jul 07, 2020  1:21 PM) Taking ~ 3-4 times weekly (back pain)  OZEMPIC, 0.25 OR 0.5 MG/DOSE, 2 MG/1.5ML SOPN [id number] Yes Inject 0.5 mg into the skin once a week. [provider] Taking Active   rosuvastatin (CRESTOR) 40 MG tablet [id number] Yes TAKE 1 TABLET EVERY DAY Leone Haven, MD Taking Active   sertraline (ZOLOFT) 50 MG tablet [id number] Yes TAKE 1 TABLET EVERY DAY Leone Haven, MD Taking Active   TRUE METRIX BLOOD GLUCOSE TEST test strip [id number] Yes TEST ONE TIME DAILY AS DIRECTED Leone Haven, MD Taking Active           Patient Active Problem List    Diagnosis Date Noted  . RUQ pain 06/09/2020  . Elbow pain 02/03/2020  . Obesity 04/07/2019  . De Quervain's tenosynovitis, right 01/19/2019  . Tinea pedis 09/09/2018  . Elevated BP without diagnosis of hypertension 11/27/2017  . Nevus 08/28/2017  . Plantar fasciitis of right foot 05/08/2016  . Elevated LDL cholesterol level 06/07/2015  . Thoracic back pain 04/13/2015  . Chronic low back pain 03/22/2015  . Anxiety and depression 03/22/2015  . Diabetes ([place]) 03/21/2015    Immunization History  Administered Date(s) Administered  . PFIZER(Purple Top)SARS-COV-2 Vaccination 06/29/2019, 07/22/2019  . Pneumococcal Polysaccharide-23 06/07/2015  . Tdap 06/07/2015    Conditions to be addressed/monitored: HLD and DMII  Care Plan : Medication Management  Updates made by De Hollingshead, RPH-CPP since 07/07/2020 12:00 AM    Problem: Diabetes, Hyperlipidemia     Long-Range Goal: Disease Progression Prevention   Start Date: 07/07/2020  This Visit's Progress: On track  Priority: High  Note:   Current Barriers:  . Unable to independently afford treatment regimen . Unable to achieve control of diabetes   Pharmacist Clinical Goal(s):  Marland Kitchen Over the next 90 days, patient will achieve control of diabetes as evidenced by A1c  through collaboration with PharmD and provider.   Interventions: . 1:1 collaboration with Leone Haven, MD regarding development and update of comprehensive plan of care as evidenced by provider attestation and co-signature . Inter-disciplinary care team collaboration (see longitudinal plan of care) . Comprehensive medication review performed; medication list updated in electronic medical record  Health Maintenance: . Due for COVID booster. Will discuss moving forward and encourage . Due for eye exam. Will encourage.   Diabetes: . Uncontrolled but improved; current treatment: Jardiance 25 mg daily, Ozempic 0.5 mg weekly o Hx metformin - GI upset . Describes  mild, dull, bilateral "abdominal pain" on the day following some Ozempic doses. Not tied to eating. Notes it self-resolves in about an hour. Denies a negative impact on his quality of life. Pain has not worsened over time with subsequent Ozempic doses. LFT normal on last check . Current meal patterns: Breakfast: pack of crackers, lunch: grilled chicken breast, mashed poatoes, pinto beans, ar[employer], tacos; supper: grilled chicken breast, mashed potatoes; spaghetti; drinks: unsweet tea + sweet and lo; sugar free orangaid twist; some water (doesn't liked flavored water).  . 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 Doesn't formally exercise . Discussed trial of Trulicity instead of Ozempic. Discussed generally less efficacy, larger needle size. Patient would like to continue Ozempic therapy at this time. I believe this is appropriate given no change in abdominal discomfort with continued Ozempic dosing, no liver enzyme elevations, no negative impact on quality of life. Reports that he will notify us if he ever has worsening pain, change in pain location or quality, or no spontaneous resolution of pain.  . Discussed cost concerns with Ozempic and Jardiance. Patient qualifies for patient assistance for both medications. Will collaborate w/ CPhT to mail patient his portion of patient assistance applications. Will collaborate w/ PCP for prescription portions.   Hyperlipidemia: . Controlled; current treatment: rosuvastatin 40 mg, ezetimibe 10 mg daily . Medications previously tried: atorvastatin - muscle aches . Recommended to continue current regimen at this time  Depression/Anxiety . Controlled per patient report; current treatment: sertraline 50 mg daily   . Continue current regimen at this time  Chronic Low Back Pain: . Controlled per patient report; notes that he previously was on opioid therapy, but wanted to wean off. Feels much better without. Current regimen: naproxen 220 mg PRN -  using 1-2 tablets ~ 3-4 times weekly  . Discussed long term concerns w/ NSAID dosing including renal, GI disease. Continue to follow renal fx, symptoms of GI concerns.   Supplements: Multivitamin  Patient Goals/Self-Care Activities . Over the next 90 days, patient will:  - take medications as prescribed check glucose daily, document, and provide at future appointments collaborate with provider on medication access solutions  Follow Up Plan: Telephone follow up appointment with care management team member scheduled for: ~ 6 weeks      Medication Assistance: Application for Ozempic, Jardiance  medication assistance program. in process.  Anticipated assistance start date TBD.  See  plan of care for additional detail.  Patient's preferred pharmacy is:  [facility] [address], [place] - [place] [address] Phone: [phone] Fax: [facility] - [place], [place] [address] Phone: [phone] Fax: [phone]  Uses pill box? Yes Pt endorses 100% compliance  Follow Up:  Patient agrees to Care Plan and Follow-up.  Plan: Telephone follow up appointment with care management team member scheduled for:  ~ 6 weeks  Catie Darnelle Maffucci, PharmD, [place], [place] Clinical Pharmacist [employer] at [employer] [phone]

## 2020-07-07 NOTE — Patient Instructions (Addendum)
Visit Information   PATIENT GOALS:  Goals Addressed              This Visit's Progress     Patient Stated   .  Medication Monitoring (pt-stated)        Patient Goals/Self-Care Activities . Over the next 90 days, patient will:  - take medications as prescribed check glucose daily, document, and provide at future appointments collaborate with provider on medication access solutions       Consent to CCM Services: Brad Jimenez was given information about Chronic Care Management services today including:  1. CCM service includes personalized support from designated clinical staff supervised by his physician, including individualized plan of care and coordination with other care providers 2. 24/7 contact phone numbers for assistance for urgent and routine care needs. 3. Service will only be billed when office clinical staff spend 20 minutes or more in a month to coordinate care. 4. Only one practitioner may furnish and bill the service in a calendar month. 5. The patient may stop CCM services at any time (effective at the end of the month) by phone call to the office staff. 6. The patient will be responsible for cost sharing (co-pay) of up to 20% of the service fee (after annual deductible is met).  Patient agreed to services and verbal consent obtained.   Patient verbalizes understanding of instructions provided today and agrees to view in [place].     CLINICAL CARE PLAN: Patient Care Plan: Medication Management    Problem Identified: Diabetes, Hyperlipidemia     Long-Range Goal: Disease Progression Prevention   Start Date: 07/07/2020  This Visit's Progress: On track  Priority: High  Note:   Current Barriers:  . Unable to independently afford treatment regimen . Unable to achieve control of diabetes   Pharmacist Clinical Goal(s):  Marland Kitchen Over the next 90 days, patient will achieve control of diabetes as evidenced by A1c  through collaboration with PharmD and provider.    Interventions: . 1:1 collaboration with Brad Haven, MD regarding development and update of comprehensive plan of care as evidenced by provider attestation and co-signature . Inter-disciplinary care team collaboration (see longitudinal plan of care) . Comprehensive medication review performed; medication list updated in electronic medical record  Health Maintenance: . Due for COVID booster. Will discuss moving forward and encourage . Due for eye exam. Will encourage.   Diabetes: . Uncontrolled but improved; current treatment: Jardiance 25 mg daily, Ozempic 0.5 mg weekly o Hx metformin - GI upset . Describes mild, dull, bilateral "abdominal pain" on the day following some Ozempic doses. Not tied to eating. Notes it self-resolves in about an hour. Denies a negative impact on his quality of life. Pain has not worsened over time with subsequent Ozempic doses. LFT normal on last check . Current meal patterns: Breakfast: pack of crackers, lunch: grilled chicken breast, mashed poatoes, pinto beans, [employer]by[employer], tacos; supper: grilled chicken breast, mashed potatoes; spaghetti; drinks: unsweet tea + sweet and lo; sugar free orangaid twist; some water (doesn't liked flavored water).  . 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 Doesn't formally exercise . Discussed trial of Trulicity instead of Ozempic. Discussed generally less efficacy, larger needle size. Patient would like to continue Ozempic therapy at this time. I believe this is appropriate given no change in abdominal discomfort with continued Ozempic dosing, no liver enzyme elevations, no negative impact on quality of life. Reports that he will notify us if he ever has worsening pain,  change in pain location or quality, or no spontaneous resolution of pain.  . Discussed cost concerns with Ozempic and Jardiance. Patient qualifies for patient assistance for both medications. Will collaborate w/ CPhT to mail patient his portion of  patient assistance applications. Will collaborate w/ PCP for prescription portions.   Hyperlipidemia: . Controlled; current treatment: rosuvastatin 40 mg, ezetimibe 10 mg daily . Medications previously tried: atorvastatin - muscle aches . Recommended to continue current regimen at this time  Depression/Anxiety . Controlled per patient report; current treatment: sertraline 50 mg daily   . Continue current regimen at this time  Chronic Low Back Pain: . Controlled per patient report; notes that he previously was on opioid therapy, but wanted to wean off. Feels much better without. Current regimen: naproxen 220 mg PRN - using 1-2 tablets ~ 3-4 times weekly  . Discussed long term concerns w/ NSAID dosing including renal, GI disease. Continue to follow renal fx, symptoms of GI concerns.   Supplements: Multivitamin  Patient Goals/Self-Care Activities . Over the next 90 days, patient will:  - take medications as prescribed check glucose daily, document, and provide at future appointments collaborate with provider on medication access solutions  Follow Up Plan: Telephone follow up appointment with care management team member scheduled for: ~ 6 weeks      Plan: Telephone follow up appointment with care management team member scheduled for:  ~ 6 weeks  Brad Jimenez, PharmD, [place], [place] Pharmacist [employer] at [place]

## 2020-07-12 ENCOUNTER — Telehealth: Payer: Self-pay | Admitting: Pharmacy Technician

## 2020-07-12 DIAGNOSIS — Z596 Low income: Secondary | ICD-10-CM

## 2020-07-12 NOTE — Progress Notes (Signed)
[facility] [facility])                                            [place] Team    07/12/2020  Brad Jimenez 11-22-1970 [id number]              Medication Assistance Referral  Referral From: [facility] Embedded RPh Brad Jimenez.   Medication/Company: Brad Jimenez / Novo Nordisk Patient application portion:  Education officer, museum portion:  N/A Embedded PharmD to have Brad Jimenez sign while in clinic  Provider address/fax verified via: Office website  Medication/Company: Brad Jimenez / [employer] Patient application portion:  Mailed Provider application portion:  N/A Embedded PharmD to have Brad Jimenez sign while in clinic  Provider address/fax verified via: Office website   Brad Jimenez Brad Jimenez, Brad Jimenez  [phone]

## 2020-07-19 ENCOUNTER — Telehealth: Payer: Self-pay | Admitting: Pharmacy Technician

## 2020-07-19 DIAGNOSIS — Z596 Low income: Secondary | ICD-10-CM

## 2020-07-19 NOTE — Progress Notes (Signed)
[place])                                            [place] Team    07/19/2020  [place] 1971/03/14 [id number]  Received both patient and provider portion(s) of patient assistance application(s) for Ozempic and Jardiance. Faxed completed application and required documents into [employer] and [employer]. Keeven Matty, [place]  [phone]

## 2020-07-21 ENCOUNTER — Ambulatory Visit: Payer: Medicare HMO | Admitting: Pharmacist

## 2020-07-21 DIAGNOSIS — E119 Type 2 diabetes mellitus without complications: Secondary | ICD-10-CM

## 2020-07-21 DIAGNOSIS — G8929 Other chronic pain: Secondary | ICD-10-CM

## 2020-07-21 DIAGNOSIS — F419 Anxiety disorder, unspecified: Secondary | ICD-10-CM

## 2020-07-21 DIAGNOSIS — F32A Depression, unspecified: Secondary | ICD-10-CM

## 2020-07-21 DIAGNOSIS — E785 Hyperlipidemia, unspecified: Secondary | ICD-10-CM

## 2020-07-21 MED ORDER — DAPAGLIFLOZIN PROPANEDIOL 10 MG PO TABS: 10.0000 mg | ORAL_TABLET | Freq: Every day | ORAL | 0 refills | Status: DC

## 2020-07-21 NOTE — Patient Instructions (Signed)
Visit Information  PATIENT GOALS: Goals Addressed              This Visit's Progress     Patient Stated   .  Medication Monitoring (pt-stated)        Patient Goals/Self-Care Activities . Over the next 90 days, patient will:  - take medications as prescribed check glucose daily, document, and provide at future appointments collaborate with provider on medication access solutions        Patient verbalizes understanding of instructions provided today and agrees to view in [place].   Plan: Telephone follow up appointment with care management team member scheduled for:  ~ 3 weeks as previously scheduled  Catie Darnelle Maffucci, PharmD, [place], [place] Clinical Pharmacist [employer] at [employer] [phone]

## 2020-07-21 NOTE — Chronic Care Management (AMB) (Signed)
Chronic Care Management Pharmacy Note  07/21/2020 Name:  Brad Jimenez. MRN:  [id number] DOB:  1970-07-01  Subjective: Brad Jimenez. is an 50 y.o. year old male who is a primary patient of Caryl Bis, Angela Adam, MD.  The CCM team was consulted for assistance with disease management and care coordination needs.    Engaged with patient by telephone for medication access follow up in response to provider referral for pharmacy case management and/or care coordination services.   Consent to Services:  The patient was given information about Chronic Care Management services, agreed to services, and gave verbal consent prior to initiation of services.  Please see initial visit note for detailed documentation.   Patient Care Team: Leone Haven, MD as PCP - General (Family Medicine) De Hollingshead, RPH-CPP (Pharmacist)  Recent office visits: None since our last call  Recent consult visits: None since our last call  Hospital visits: None in previous 6 months  Objective:  Lab Results  Component Value Date   CREATININE 0.78 02/03/2020   CREATININE 0.87 08/10/2019   CREATININE 0.84 04/28/2019    Lab Results  Component Value Date   HGBA1C 7.3 (H) 06/06/2020   Last diabetic Eye exam:  Lab Results  Component Value Date/Time   HMDIABEYEEXA No Retinopathy 02/18/2017 12:00 AM    Last diabetic Foot exam: No results found for: HMDIABFOOTEX      Component Value Date/Time   CHOL 125 08/10/2019 1410   TRIG 294.0 (H) 08/10/2019 1410   HDL 36.70 (L) 08/10/2019 1410   CHOLHDL 3 08/10/2019 1410   VLDL 58.8 (H) 08/10/2019 1410   LDLCALC 65 05/08/2016 1046   LDLDIRECT 63.0 08/10/2019 1410    Hepatic Function Latest Ref Rng & Units 06/06/2020 04/28/2019 04/28/2019  Total Protein 6.0 - 8.3 g/dL 7.8 7.5 7.5  Albumin 3.5 - 5.2 g/dL 4.6 4.7 4.7  AST 0 - 37 U/L 29 23 23   ALT 0 - 53 U/L 50 46 46  Alk Phosphatase 39 - 117 U/L 83 86 86  Total Bilirubin 0.2 - 1.2 mg/dL 0.4 0.6 0.6   Bilirubin, Direct 0.0 - 0.3 mg/dL 0.1 0.1 -    No results found for: TSH, FREET4  CBC Latest Ref Rng & Units 02/20/2015  WBC 3.8 - 10.6 K/uL 9.4  Hemoglobin 13.0 - 18.0 g/dL 15.3  Hematocrit 40.0 - 52.0 % 43.0  Platelets 150 - 440 K/uL 289    Clinical ASCVD: No      Social History   Tobacco Use  Smoking Status Former Smoker  . Packs/day: 1.50  . Years: 22.00  . Pack years: 33.00  . Types: Cigarettes  . Quit date: 02/20/2011  . Years since quitting: 9.4  Smokeless Tobacco Never Used   BP Readings from Last 3 Encounters:  06/06/20 120/70  02/03/20 120/80  08/10/19 130/70   Pulse Readings from Last 3 Encounters:  06/06/20 84  02/03/20 75  08/10/19 (!) 101   Wt Readings from Last 3 Encounters:  06/06/20 219 lb (99.3 kg)  02/03/20 229 lb 3.2 oz (104 kg)  09/23/19 231 lb (104.8 kg)    Assessment: Review of patient past medical history, allergies, medications, health status, including review of consultants reports, laboratory and other test data, was performed as part of comprehensive evaluation and provision of chronic care management services.   SDOH:  (Social Determinants of Health) assessments and interventions performed:  SDOH Interventions   Flowsheet Row Most Recent Value  SDOH Interventions  Financial Strain Interventions Other (Comment)  [manufacturer assistance]      CCM Care Plan  Allergies  Allergen Reactions  . Lipitor [Atorvastatin Calcium]     Severe muscle aches and pain.  . Fentanyl Other (See Comments)    Hyperactivity  . Metformin And Related Other (See Comments)    Blurry vision    Medications Reviewed Today    Reviewed by De Hollingshead, RPH-CPP (Pharmacist) on 07/07/20 at 1323  Med List Status: <None>  Medication Order Taking? Sig Documenting Provider Last Dose Status Informant  blood glucose meter kit and supplies KIT [id number] No Dispense based on patient and insurance preference. Use once daily as directed. (FOR ICD-10  E11.9).  Patient not taking: Reported on 07/07/2020   Leone Haven, MD Not Taking Active   ezetimibe (ZETIA) 10 MG tablet [id number] Yes TAKE 1 TABLET EVERY DAY Leone Haven, MD Taking Active   JARDIANCE 25 MG TABS tablet [id number] Yes TAKE 1 TABLET EVERY DAY Leone Haven, MD Taking Active   Multiple Vitamin (MULTIVITAMIN WITH MINERALS) TABS tablet [id number] Yes Take 1 tablet by mouth daily. [provider] Taking Active   naproxen sodium (ALEVE) 220 MG tablet [id number] Yes Take 2 tablets by mouth daily as needed. [provider] Taking Active            Med Note Darnelle Maffucci, Kele Withem E   Thu Jul 07, 2020  1:21 PM) Taking ~ 3-4 times weekly (back pain)  OZEMPIC, 0.25 OR 0.5 MG/DOSE, 2 MG/1.5ML SOPN [id number] Yes Inject 0.5 mg into the skin once a week. [provider] Taking Active   rosuvastatin (CRESTOR) 40 MG tablet [id number] Yes TAKE 1 TABLET EVERY DAY Leone Haven, MD Taking Active   sertraline (ZOLOFT) 50 MG tablet [id number] Yes TAKE 1 TABLET EVERY DAY Leone Haven, MD Taking Active   TRUE METRIX BLOOD GLUCOSE TEST test strip [id number] Yes TEST ONE TIME DAILY AS DIRECTED Leone Haven, MD Taking Active           Patient Active Problem List   Diagnosis Date Noted  . RUQ pain 06/09/2020  . Elbow pain 02/03/2020  . Obesity 04/07/2019  . De Quervain's tenosynovitis, right 01/19/2019  . Tinea pedis 09/09/2018  . Elevated BP without diagnosis of hypertension 11/27/2017  . Nevus 08/28/2017  . Plantar fasciitis of right foot 05/08/2016  . Elevated LDL cholesterol level 06/07/2015  . Thoracic back pain 04/13/2015  . Chronic low back pain 03/22/2015  . Anxiety and depression 03/22/2015  . Diabetes ([place]) 03/21/2015    Immunization History  Administered Date(s) Administered  . PFIZER(Purple Top)SARS-COV-2 Vaccination 06/29/2019, 07/22/2019  . Pneumococcal Polysaccharide-23 06/07/2015  . Tdap 06/07/2015    Conditions to be  addressed/monitored: HLD, DMII and Depression  Care Plan : Medication Management  Updates made by De Hollingshead, RPH-CPP since 07/21/2020 12:00 AM    Problem: Diabetes, Hyperlipidemia     Long-Range Goal: Disease Progression Prevention   Start Date: 07/07/2020  This Visit's Progress: On track  Recent Progress: On track  Priority: High  Note:   Current Barriers:  . Unable to independently afford treatment regimen . Unable to achieve control of diabetes   Pharmacist Clinical Goal(s):  Marland Kitchen Over the next 90 days, patient will achieve control of diabetes as evidenced by A1c  through collaboration with PharmD and provider.   Interventions: . 1:1 collaboration with Leone Haven, MD regarding development and update of comprehensive plan of care as evidenced by  provider attestation and co-signature . Inter-disciplinary care team collaboration (see longitudinal plan of care) . Comprehensive medication review performed; medication list updated in electronic medical record  Health Maintenance: . Due for COVID booster. Will discuss moving forward and encourage . Due for eye exam. Will encourage.   Diabetes: . Uncontrolled but improved; current treatment: Jardiance 25 mg daily, Ozempic 0.5 mg weekly o Hx metformin - GI upset . Current meal patterns: Breakfast: pack of crackers, lunch: grilled chicken breast, mashed poatoes, pinto beans, arby's, tacos; supper: grilled chicken breast, mashed potatoes; spaghetti; drinks: unsweet tea + sweet and lo; sugar free orangaid twist; some water (doesn't liked flavored water).  . 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 Doesn't formally exercise . Received notice that patient was denied for [facility] for Jardiance assistance unless he is denied for Medicare Extra Help. He is over income for Medicare Extra Help. [place] has a higher income cut off for [place]. Discussed switch with patient, he is amenable. Complete home Jardiance  supply and then start Farxiga 10 mg daily. 3 weeks of samples provided. Patient will come by later today to sign his portion of [place] patient assistance application  Hyperlipidemia: . Controlled per last lipid panel; current treatment: rosuvastatin 40 mg, ezetimibe 10 mg daily . Medications previously tried: atorvastatin - muscle aches . Recommended to continue current regimen at this time  Depression/Anxiety . Controlled per patient report; current treatment: sertraline 50 mg daily   . Continue current regimen at this time  Chronic Low Back Pain: . Controlled per patient report; notes that he previously was on opioid therapy, but wanted to wean off. Feels much better without. Current regimen: naproxen 220 mg PRN - using 1-2 tablets ~ 3-4 times weekly  . Discussed long term concerns w/ NSAID dosing including renal, GI disease. Continue to follow renal fx, symptoms of GI concerns.   Supplements: Multivitamin  Patient Goals/Self-Care Activities . Over the next 90 days, patient will:  - take medications as prescribed check glucose daily, document, and provide at future appointments collaborate with provider on medication access solutions  Follow Up Plan: Telephone follow up appointment with care management team member scheduled for: ~3 weeks as previously scheduled      Medication Assistance: Application for Farxiga, Ozempic   medication assistance program. in process.  Anticipated assistance start date TBD.  See plan of care for additional detail.  Patient's preferred pharmacy is:  [facility] [address] [place] [address] Phone: [phone] Fax: [facility] - [place], [place] [address] Phone: [phone] Fax: [phone]   Follow Up:  Patient agrees to Care Plan and Follow-up.  Plan: Telephone follow up appointment with care  management team member scheduled for:  ~ 3 weeks as previously scheduled  Catie Darnelle Maffucci, PharmD, [place], [place] Clinical Pharmacist [employer] at [employer] [phone]

## 2020-07-22 ENCOUNTER — Telehealth: Payer: Self-pay | Admitting: Pharmacy Technician

## 2020-07-22 DIAGNOSIS — Z596 Low income: Secondary | ICD-10-CM

## 2020-07-22 NOTE — Progress Notes (Signed)
[facility])                                            [place] Team    07/22/2020  Brad Jimenez. [birth year] [id number]  2 care coordination calls made to [place] in regards to [place] application and [employer] in regards to [place] application.  Spoke to [place] at [employer] and patient is APPROVED 07/18/20-04/01/21. Medication should be delivered to provider's office in next 10-14 business days.  Spoke to B[place]y [place]ne at [facility] who informed patient would need to apply for LIS in order for determination process to continue. Per PharmD patient is over income for Medicare Extra Help/LIS and therefore the determination was made to switch patient to [name] whose requirements patient may meet.                                      Medication Assistance Referral  Referral From: [place] Catie T.   Medication/Company: [name] Patient application portion:  N/A patient signed in clinic Provider application portion:  N/A provider signed in clinic to Dr. [name] Provider address/fax verified via: Office website   Received both patient and provider portion(s) of patient assistance application(s) for [name]. PharmD submitted  completed application and required documents into [employer].    Brad Hoard P. Brad Jimenez, [place]  [phone]

## 2020-07-27 ENCOUNTER — Telehealth: Payer: Self-pay

## 2020-07-27 NOTE — Telephone Encounter (Signed)
I received a fax from [employer] and the patient was approved effective 07/27/2020-04/01/2021.  Theta Leaf,cma

## 2020-07-28 ENCOUNTER — Telehealth: Payer: Self-pay | Admitting: Pharmacy Technician

## 2020-07-28 DIAGNOSIS — Z596 Low income: Secondary | ICD-10-CM

## 2020-07-28 NOTE — Progress Notes (Signed)
[facility])                                            [place] Team    07/28/2020  Brad Jimenez. [birth year] [id number]  Care coordination call placed to [employer] in regards to [facility] application.  Spoke to [facility] who informed patient was APPROVED 07/27/20-04/01/21. Medication should arrive to patient's home in the next 10-20 business days. Refills are auto shipped to patient's home during enrollment period outlined above as long as prescription remains current.  Brad Jimenez, [place]  [phone]

## 2020-08-10 ENCOUNTER — Telehealth: Payer: Self-pay

## 2020-08-10 ENCOUNTER — Ambulatory Visit: Payer: Medicare HMO | Admitting: Pharmacist

## 2020-08-10 DIAGNOSIS — G8929 Other chronic pain: Secondary | ICD-10-CM

## 2020-08-10 DIAGNOSIS — R030 Elevated blood-pressure reading, without diagnosis of hypertension: Secondary | ICD-10-CM

## 2020-08-10 DIAGNOSIS — E7800 Pure hypercholesterolemia, unspecified: Secondary | ICD-10-CM

## 2020-08-10 DIAGNOSIS — E119 Type 2 diabetes mellitus without complications: Secondary | ICD-10-CM

## 2020-08-10 DIAGNOSIS — M5442 Lumbago with sciatica, left side: Secondary | ICD-10-CM

## 2020-08-10 DIAGNOSIS — F32A Depression, unspecified: Secondary | ICD-10-CM

## 2020-08-10 NOTE — Chronic Care Management (AMB) (Signed)
Chronic Care Management Pharmacy Note  08/10/2020 Name:  Brad Jimenez. MRN:  [id number] DOB:  Sep 13, 1970  Subjective: Brad Jimenez. is an 50 y.o. year old male who is a primary patient of Caryl Bis, Angela Adam, MD.  The CCM team was consulted for assistance with disease management and care coordination needs.    Engaged with patient by telephone for medication access in response to provider referral for pharmacy case management and/or care coordination services.   Consent to Services:  The patient was given information about Chronic Care Management services, agreed to services, and gave verbal consent prior to initiation of services.  Please see initial visit note for detailed documentation.   Patient Care Team: Leone Haven, MD as PCP - General (Family Medicine) De Hollingshead, RPH-CPP (Pharmacist)  Recent office visits: None since our last call  Recent consult visits: None since our last call  Hospital visits: None in previous 6 months  Objective:  Lab Results  Component Value Date   CREATININE 0.78 02/03/2020   CREATININE 0.87 08/10/2019   CREATININE 0.84 04/28/2019    Lab Results  Component Value Date   HGBA1C 7.3 (H) 06/06/2020   Last diabetic Eye exam:  Lab Results  Component Value Date/Time   HMDIABEYEEXA No Retinopathy 02/18/2017 12:00 AM    Last diabetic Foot exam: No results found for: HMDIABFOOTEX      Component Value Date/Time   CHOL 125 08/10/2019 1410   TRIG 294.0 (H) 08/10/2019 1410   HDL 36.70 (L) 08/10/2019 1410   CHOLHDL 3 08/10/2019 1410   VLDL 58.8 (H) 08/10/2019 1410   LDLCALC 65 05/08/2016 1046   LDLDIRECT 63.0 08/10/2019 1410    Hepatic Function Latest Ref Rng & Units 06/06/2020 04/28/2019 04/28/2019  Total Protein 6.0 - 8.3 g/dL 7.8 7.5 7.5  Albumin 3.5 - 5.2 g/dL 4.6 4.7 4.7  AST 0 - 37 U/L _0 ALT 0 - 53 U/L 50 46 46  Alk Phosphatase 39 - 117 U/L 83 86 86  Total Bilirubin 0.2 - 1.2 mg/dL 0.4 0.6 0.6   Bilirubin, Direct 0.0 - 0.3 mg/dL 0.1 0.1 -    No results found for: TSH, FREET4  CBC Latest Ref Rng & Units 02/20/2015  WBC 3.8 - 10.6 K/uL 9.4  Hemoglobin 13.0 - 18.0 g/dL 15.3  Hematocrit 40.0 - 52.0 % 43.0  Platelets 150 - 440 K/uL 289    No results found for: VD25OH  Clinical ASCVD: No    Social History   Tobacco Use  Smoking Status Former Smoker  . Packs/day: 1.50  . Years: 22.00  . Pack years: 33.00  . Types: Cigarettes  . Quit date: 02/20/2011  . Years since quitting: 9.4  Smokeless Tobacco Never Used   BP Readings from Last 3 Encounters:  06/06/20 120/70  02/03/20 120/80  08/10/19 130/70   Pulse Readings from Last 3 Encounters:  06/06/20 84  02/03/20 75  08/10/19 (!) 101   Wt Readings from Last 3 Encounters:  06/06/20 219 lb (99.3 kg)  02/03/20 229 lb 3.2 oz (104 kg)  09/23/19 231 lb (104.8 kg)    Assessment: Review of patient past medical history, allergies, medications, health status, including review of consultants reports, laboratory and other test data, was performed as part of comprehensive evaluation and provision of chronic care management services.   SDOH:  (Social Determinants of Health) assessments and interventions performed:  SDOH Interventions   Flowsheet Row Most Recent Value  SDOH Interventions   Financial Strain Interventions Other (Comment)  [manufacturer assistance]      CCM Care Plan  Allergies  Allergen Reactions  . Lipitor [Atorvastatin Calcium]     Severe muscle aches and pain.  . Fentanyl Other (See Comments)    Hyperactivity  . Metformin And Related Other (See Comments)    Blurry vision    Medications Reviewed Today    Reviewed by De Hollingshead, RPH-CPP (Pharmacist) on 07/07/20 at 1323  Med List Status: <None>  Medication Order Taking? Sig Documenting Provider Last Dose Status Informant  blood glucose meter kit and supplies KIT [id number] No Dispense based on patient and insurance preference. Use once daily  as directed. (FOR ICD-10 E11.9).  Patient not taking: Reported on 07/07/2020   Leone Haven, MD Not Taking Active   ezetimibe (ZETIA) 10 MG tablet [id number] Yes TAKE 1 TABLET EVERY DAY Leone Haven, MD Taking Active   JARDIANCE 25 MG TABS tablet [id number] Yes TAKE 1 TABLET EVERY DAY Leone Haven, MD Taking Active   Multiple Vitamin (MULTIVITAMIN WITH MINERALS) TABS tablet [id number] Yes Take 1 tablet by mouth daily. [provider] Taking Active   naproxen sodium (ALEVE) 220 MG tablet [id number] Yes Take 2 tablets by mouth daily as needed. [provider] Taking Active            Med Note Darnelle Maffucci, Flynn Lininger E   Thu Jul 07, 2020  1:21 PM) Taking ~ 3-4 times weekly (back pain)  OZEMPIC, 0.25 OR 0.5 MG/DOSE, 2 MG/1.5ML SOPN [id number] Yes Inject 0.5 mg into the skin once a week. [provider] Taking Active   rosuvastatin (CRESTOR) 40 MG tablet [id number] Yes TAKE 1 TABLET EVERY DAY Leone Haven, MD Taking Active   sertraline (ZOLOFT) 50 MG tablet [id number] Yes TAKE 1 TABLET EVERY DAY Leone Haven, MD Taking Active   TRUE METRIX BLOOD GLUCOSE TEST test strip [id number] Yes TEST ONE TIME DAILY AS DIRECTED Leone Haven, MD Taking Active           Patient Active Problem List   Diagnosis Date Noted  . RUQ pain 06/09/2020  . Elbow pain 02/03/2020  . Obesity 04/07/2019  . De Quervain's tenosynovitis, right 01/19/2019  . Tinea pedis 09/09/2018  . Elevated BP without diagnosis of hypertension 11/27/2017  . Nevus 08/28/2017  . Plantar fasciitis of right foot 05/08/2016  . Elevated LDL cholesterol level 06/07/2015  . Thoracic back pain 04/13/2015  . Chronic low back pain 03/22/2015  . Anxiety and depression 03/22/2015  . Diabetes ([place]) 03/21/2015    Immunization History  Administered Date(s) Administered  . PFIZER(Purple Top)SARS-COV-2 Vaccination 06/29/2019, 07/22/2019  . Pneumococcal Polysaccharide-23 06/07/2015  . Tdap 06/07/2015     Conditions to be addressed/monitored: HTN, HLD and DMII  Care Plan : Medication Management  Updates made by De Hollingshead, RPH-CPP since 08/10/2020 12:00 AM    Problem: Diabetes, Hyperlipidemia     Long-Range Goal: Disease Progression Prevention   Start Date: 07/07/2020  This Visit's Progress: On track  Recent Progress: On track  Priority: High  Note:   Current Barriers:  . Unable to independently afford treatment regimen . Unable to achieve control of diabetes   Pharmacist Clinical Goal(s):  Marland Kitchen Over the next 90 days, patient will achieve control of diabetes as evidenced by A1c  through collaboration with PharmD and provider.   Interventions: . 1:1 collaboration with Leone Haven, MD regarding development and update of comprehensive plan of  care as evidenced by provider attestation and co-signature . Inter-disciplinary care team collaboration (see longitudinal plan of care) . Comprehensive medication review performed; medication list updated in electronic medical record  Health Maintenance: . Due for COVID booster. Will discuss moving forward and encourage . Due for eye exam. Will encourage.   Diabetes: . Uncontrolled but improved; current treatment: Farxiga 10 mg daily, Ozempic 0.5 mg weekly o Hx metformin - GI upset . Current meal patterns: Breakfast: pack of crackers, lunch: grilled chicken breast, mashed poatoes, pinto beans, arby's, tacos; supper: grilled chicken breast, mashed potatoes; spaghetti; drinks: unsweet tea + sweet and lo; sugar free orangaid twist; some water (doesn't liked flavored water).  . 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 Doesn't formally exercise . Approved for [employer] patient assistance. Medication en route, but will not arrive before patient runs out of current sample. Provided 1 week of sample [place].   Hyperlipidemia: . Controlled per last lipid panel; current treatment: rosuvastatin 40 mg, ezetimibe 10 mg  daily . Medications previously tried: atorvastatin - muscle aches . Previously recommended to continue current regimen at this time  Depression/Anxiety . Controlled per patient report; current treatment: sertraline 50 mg daily   . Previously recommended to continue current regimen at this time  Chronic Low Back Pain: . Controlled per patient report; notes that he previously was on opioid therapy, but wanted to wean off. Feels much better without. Current regimen: naproxen 220 mg PRN - using 1-2 tablets ~ 3-4 times weekly  . Previously discussed long term concerns w/ NSAID dosing including renal, GI disease. Continue to follow renal fx, symptoms of GI concerns.   Supplements: Multivitamin  Patient Goals/Self-Care Activities . Over the next 90 days, patient will:  - take medications as prescribed check glucose daily, document, and provide at future appointments collaborate with provider on medication access solutions  Follow Up Plan: Telephone follow up appointment with care management team member scheduled for: ~6 weeks       Medication Assistance: Franne Grip obtained through [employer] medication assistance program.  Enrollment ends 03/01/21 for Ozempic, 04/01/21 for [name]  Patient's preferred pharmacy is:  [facility], [place] - [place] [address] Phone: [phone] Fax: [facility] - [place], [place] [address] Phone: [phone] Fax: [phone]  Follow Up:  Patient agrees to Care Plan and Follow-up.  Plan: Telephone follow up appointment with care management team member scheduled for:  ~ 6 weeks  Catie Darnelle Maffucci, PharmD, [place], [place] Clinical Pharmacist [employer] at [employer] [phone]

## 2020-08-10 NOTE — Patient Instructions (Signed)
Visit Information  PATIENT GOALS:  Goals Addressed               This Visit's Progress     Patient Stated     Medication Monitoring (pt-stated)        Patient Goals/Self-Care Activities Over the next 90 days, patient will:  - take medications as prescribed check glucose daily, document, and provide at future appointments collaborate with provider on medication access solutions         Patient verbalizes understanding of instructions provided today and agrees to view in MyChart.    Plan: Telephone follow up appointment with care management team member scheduled for:  6 weeks  Catie Juelz Whittenberg, PharmD, BCACP, CPP Clinical Pharmacist [facility] at  [facility] [phone] 

## 2020-08-10 NOTE — Chronic Care Management (AMB) (Signed)
  Care Management   Note  08/10/2020 Name: Brad Jimenez. MRN: [id number] DOB: 1970-09-19  Brad Jimenez. is a 50 y.o. year old male who is a primary care patient of Leone Haven, MD and is actively engaged with the care management team. I reached out to [employer] by phone today to assist with re-scheduling a follow up visit with the Pharmacist  Follow up plan: Unsuccessful telephone outreach attempt made. A HIPAA compliant phone message was left for the patient providing contact information and requesting a return call.  The care management team will reach out to the patient again over the next 5 days.  If patient returns call to provider office, please advise to call [facility]  at [place], [place], [place], [place] [address] Direct Dial: [phone] [email or web address] Website: [email or web address]

## 2020-08-11 ENCOUNTER — Telehealth: Payer: Self-pay

## 2020-08-11 NOTE — Telephone Encounter (Signed)
I called and informed the patient that his Ozempic came in and he can pick it up when he can and he understood.  Nakhi Choi,cma

## 2020-08-16 ENCOUNTER — Ambulatory Visit (INDEPENDENT_AMBULATORY_CARE_PROVIDER_SITE_OTHER): Payer: Medicare HMO | Admitting: Pharmacist

## 2020-08-16 DIAGNOSIS — E119 Type 2 diabetes mellitus without complications: Secondary | ICD-10-CM

## 2020-08-16 DIAGNOSIS — F419 Anxiety disorder, unspecified: Secondary | ICD-10-CM | POA: Diagnosis not present

## 2020-08-16 DIAGNOSIS — E7800 Pure hypercholesterolemia, unspecified: Secondary | ICD-10-CM

## 2020-08-16 DIAGNOSIS — F32A Depression, unspecified: Secondary | ICD-10-CM | POA: Diagnosis not present

## 2020-08-16 DIAGNOSIS — E785 Hyperlipidemia, unspecified: Secondary | ICD-10-CM

## 2020-08-16 NOTE — Patient Instructions (Addendum)
[place],   It was great talking to you today!  Our goal A1c <7% corresponds with fasting sugars <130 and 2 hour post prandial sugars <180. Please add in some checks after meals to see where those readings are.   If your next A1c is >7% again, I recommend that we increase the Ozempic to 1 mg weekly. We can send a new prescription to the manufacturer if so.   If you haven't already, please schedule for eye exam and have those results faxed to our office!  Catie Darnelle Maffucci, PharmD [phone]  Visit Information  PATIENT GOALS: Goals Addressed              This Visit's Progress     Patient Stated   .  Medication Monitoring (pt-stated)        Patient Goals/Self-Care Activities . Over the next 90 days, patient will:  - take medications as prescribed check glucose daily, document, and provide at future appointments collaborate with provider on medication access solutions       Patient verbalizes understanding of instructions provided today and agrees to view in [place].   Plan: Telephone follow up appointment with care management team member scheduled for:  ~ 8 weeks  Catie Darnelle Maffucci, PharmD, [place], [place] Clinical Pharmacist [employer] at [employer] [phone]

## 2020-08-16 NOTE — Chronic Care Management (AMB) (Signed)
Chronic Care Management Pharmacy Note  08/16/2020 Name:  Brad Jimenez. MRN:  [id number] DOB:  1970/10/15  Subjective: Brad Jimenez. is an 50 y.o. year old male who is a primary patient of Caryl Bis, Angela Adam, MD.  The CCM team was consulted for assistance with disease management and care coordination needs.    Engaged with patient by telephone for follow up visit in response to provider referral for pharmacy case management and/or care coordination services.   Consent to Services:  The patient was given information about Chronic Care Management services, agreed to services, and gave verbal consent prior to initiation of services.  Please see initial visit note for detailed documentation.   Patient Care Team: Leone Haven, MD as PCP - General (Family Medicine) De Hollingshead, RPH-CPP (Pharmacist)  Recent office visits: None since our last viist  Recent consult visits: None since our last visit   Hospital visits: None in previous 6 months  Objective:  Lab Results  Component Value Date   CREATININE 0.78 02/03/2020   CREATININE 0.87 08/10/2019   CREATININE 0.84 04/28/2019    Lab Results  Component Value Date   HGBA1C 7.3 (H) 06/06/2020   Last diabetic Eye exam:  Lab Results  Component Value Date/Time   HMDIABEYEEXA No Retinopathy 02/18/2017 12:00 AM    Last diabetic Foot exam: No results found for: HMDIABFOOTEX      Component Value Date/Time   CHOL 125 08/10/2019 1410   TRIG 294.0 (H) 08/10/2019 1410   HDL 36.70 (L) 08/10/2019 1410   CHOLHDL 3 08/10/2019 1410   VLDL 58.8 (H) 08/10/2019 1410   LDLCALC 65 05/08/2016 1046   LDLDIRECT 63.0 08/10/2019 1410    Hepatic Function Latest Ref Rng & Units 06/06/2020 04/28/2019 04/28/2019  Total Protein 6.0 - 8.3 g/dL 7.8 7.5 7.5  Albumin 3.5 - 5.2 g/dL 4.6 4.7 4.7  AST 0 - 37 U/L _0 ALT 0 - 53 U/L 50 46 46  Alk Phosphatase 39 - 117 U/L 83 86 86  Total Bilirubin 0.2 - 1.2 mg/dL 0.4 0.6 0.6   Bilirubin, Direct 0.0 - 0.3 mg/dL 0.1 0.1 -    No results found for: TSH, FREET4  CBC Latest Ref Rng & Units 02/20/2015  WBC 3.8 - 10.6 K/uL 9.4  Hemoglobin 13.0 - 18.0 g/dL 15.3  Hematocrit 40.0 - 52.0 % 43.0  Platelets 150 - 440 K/uL 289    Clinical ASCVD: No  The ASCVD Risk score Mikey Bussing DC Jr., et al., 2013) failed to calculate for the following reasons:   The valid total cholesterol range is 130 to 320 mg/dL      Social History   Tobacco Use  Smoking Status Former Smoker  . Packs/day: 1.50  . Years: 22.00  . Pack years: 33.00  . Types: Cigarettes  . Quit date: 02/20/2011  . Years since quitting: 9.4  Smokeless Tobacco Never Used   BP Readings from Last 3 Encounters:  06/06/20 120/70  02/03/20 120/80  08/10/19 130/70   Pulse Readings from Last 3 Encounters:  06/06/20 84  02/03/20 75  08/10/19 (!) 101   Wt Readings from Last 3 Encounters:  06/06/20 219 lb (99.3 kg)  02/03/20 229 lb 3.2 oz (104 kg)  09/23/19 231 lb (104.8 kg)    Assessment: Review of patient past medical history, allergies, medications, health status, including review of consultants reports, laboratory and other test data, was performed as part of comprehensive evaluation and provision  of chronic care management services.   SDOH:  (Social Determinants of Health) assessments and interventions performed:  SDOH Interventions   Flowsheet Row Most Recent Value  SDOH Interventions   Financial Strain Interventions Other (Comment)  [manufacturer assistance]      CCM Care Plan  Allergies  Allergen Reactions  . Lipitor [Atorvastatin Calcium]     Severe muscle aches and pain.  . Fentanyl Other (See Comments)    Hyperactivity  . Metformin And Related Other (See Comments)    Blurry vision    Medications Reviewed Today    Reviewed by De Hollingshead, RPH-CPP (Pharmacist) on 08/16/20 at 60  Med List Status: <None>  Medication Order Taking? Sig Documenting Provider Last Dose Status  Informant  blood glucose meter kit and supplies KIT [id number] Yes Dispense based on patient and insurance preference. Use once daily as directed. (FOR ICD-10 E11.9). Leone Haven, MD Taking Active   dapagliflozin propanediol (FARXIGA) 10 MG TABS tablet [id number] Yes Take 1 tablet (10 mg total) by mouth daily before breakfast. Leone Haven, MD Taking Active   ezetimibe (ZETIA) 10 MG tablet [id number] Yes TAKE 1 TABLET EVERY DAY Leone Haven, MD Taking Active   Multiple Vitamin (MULTIVITAMIN WITH MINERALS) TABS tablet [id number] Yes Take 1 tablet by mouth daily. [provider] Taking Active   naproxen sodium (ALEVE) 220 MG tablet [id number] Yes Take 2 tablets by mouth daily as needed. [provider] Taking Active            Med Note Darnelle Maffucci, Rita Vialpando E   Thu Jul 07, 2020  1:21 PM) Taking ~ 3-4 times weekly (back pain)  OZEMPIC, 0.25 OR 0.5 MG/DOSE, 2 MG/1.5ML SOPN [id number] Yes Inject 0.5 mg into the skin once a week. [provider] Taking Active   rosuvastatin (CRESTOR) 40 MG tablet [id number] Yes TAKE 1 TABLET EVERY DAY Leone Haven, MD Taking Active   sertraline (ZOLOFT) 50 MG tablet [id number] Yes TAKE 1 TABLET EVERY DAY Leone Haven, MD Taking Active   TRUE METRIX BLOOD GLUCOSE TEST test strip [id number]  TEST ONE TIME DAILY AS DIRECTED Leone Haven, MD  Active           Patient Active Problem List   Diagnosis Date Noted  . RUQ pain 06/09/2020  . Elbow pain 02/03/2020  . Obesity 04/07/2019  . De Quervain's tenosynovitis, right 01/19/2019  . Tinea pedis 09/09/2018  . Elevated BP without diagnosis of hypertension 11/27/2017  . Nevus 08/28/2017  . Plantar fasciitis of right foot 05/08/2016  . Elevated LDL cholesterol level 06/07/2015  . Thoracic back pain 04/13/2015  . Chronic low back pain 03/22/2015  . Anxiety and depression 03/22/2015  . Diabetes ([place]) 03/21/2015    Immunization History  Administered Date(s)  Administered  . PFIZER(Purple Top)SARS-COV-2 Vaccination 06/29/2019, 07/22/2019  . Pneumococcal Polysaccharide-23 06/07/2015  . Tdap 06/07/2015    Conditions to be addressed/monitored: HTN, HLD and DMII  Care Plan : Medication Management  Updates made by De Hollingshead, RPH-CPP since 08/16/2020 12:00 AM    Problem: Diabetes, Hyperlipidemia     Long-Range Goal: Disease Progression Prevention   Start Date: 07/07/2020  This Visit's Progress: On track  Recent Progress: On track  Priority: High  Note:   Current Barriers:  . Unable to independently afford treatment regimen . Unable to achieve control of diabetes   Pharmacist Clinical Goal(s):  Marland Kitchen Over the next 90 days, patient will achieve control of diabetes as evidenced by A1c  through collaboration with PharmD and provider.   Interventions: . 1:1 collaboration with Leone Haven, MD regarding development and update of comprehensive plan of care as evidenced by provider attestation and co-signature . Inter-disciplinary care team collaboration (see longitudinal plan of care) . Comprehensive medication review performed; medication list updated in electronic medical record  Health Maintenance: . Due for COVID booster. Will discuss moving forward and encourage . Due for eye exam. Patient notes he will call today to schedule.   Diabetes: . Uncontrolled but improved; current treatment: Farxiga 10 mg daily, Ozempic 0.5 mg weekly . Denies any stomach upset, nausea, stomach pain lately with Ozempic injections.  o Hx metformin - GI upset . Current glucose readings: fastings ~150s, not checking other times.  . Current meal patterns: Breakfast: pack of crackers, lunch: grilled chicken breast, mashed poatoes, pinto beans, arby's, tacos; supper: grilled chicken breast, mashed potatoes; spaghetti; drinks: unsweet tea + sweet and lo; sugar free orangaid twist; some water (doesn't liked flavored water).  . 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 Doesn't formally exercise . Approved for [place] patient assistance through 04/01/21; Approved for [employer] patient assistance through 03/01/21 . Discussed goal A1c, goal fasting, and goal 2 hour post prandial glucose. Encouraged to check some post prandial readings to more comprehensively evaluate glucose control. He verbalized understanding.  . If next A1c >7%, recommend increasing Ozempic to 1 mg weekly. I can send an updated script to [employer] patient assistance.  . Reviewed refill process for [place] - patient to call when due. Reviewed that [facility] will send automatic refills.   Hyperlipidemia: . Controlled per last lipid panel; current treatment: rosuvastatin 40 mg, ezetimibe 10 mg daily . Refill history up to date . Medications previously tried: atorvastatin - muscle aches . Recommended to continue current regimen at this time  Depression/Anxiety . Controlled per patient report; current treatment: sertraline 50 mg daily   . Recommended to continue current regimen at this time  Chronic Low Back Pain: . Controlled per patient report; Current regimen: naproxen 220 mg PRN - using 1-2 tablets ~ 3-4 times weekly  . Previously discussed long term concerns w/ NSAID dosing including renal, GI disease. Continue to follow renal fx, symptoms of GI concerns.   Supplements: Multivitamin  Patient Goals/Self-Care Activities . Over the next 90 days, patient will:  - take medications as prescribed check glucose daily, document, and provide at future appointments collaborate with provider on medication access solutions  Follow Up Plan: Telephone follow up appointment with care management team member scheduled for: ~6 weeks       Medication Assistance: [name] obtained through [employer] medication assistance program.  Enrollment ends 04/01/21.  Ozempic obtained through [employer] medication assistance program.  Enrollment ends 03/01/21  Patient's  preferred pharmacy is:  [facility], [place] - [address] [place] [address] Phone: [phone] Fax: [facility] - [place], [place] [address] Phone: [phone] Fax: [phone]   Follow Up:  Patient agrees to Care Plan and Follow-up.  Plan: Telephone follow up appointment with care management team member scheduled for:  ~ 8 weeks  Catie Darnelle Maffucci, PharmD, [place], [place] Clinical Pharmacist [employer] at [employer] [phone]

## 2020-08-17 NOTE — Chronic Care Management (AMB) (Signed)
  Care Management   Note  08/17/2020 Name: Brad Jimenez. MRN: [id number] DOB: 12-05-1970  Brad Jimenez. is a 50 y.o. year old male who is a primary care patient of Leone Haven, MD and is actively engaged with the care management team. I reached out to [employer]. by phone today to assist with re-scheduling a follow up visit with the Pharmacist  Follow up plan: Unsuccessful telephone outreach attempt made. A HIPAA compliant phone message was left for the patient providing contact information and requesting a return call.  The care management team will reach out to the patient again over the next 7 days.  If patient returns call to provider office, please advise to call [facility]  at [place], [place], [place], [place] [address] Direct Dial: [phone] [email or web address]: [email or web address]

## 2020-08-25 NOTE — Chronic Care Management (AMB) (Signed)
  Care Management   Note  08/25/2020 Name: Brad Jimenez. MRN: [id number] DOB: December 01, 1970  Brad Jimenez. is a 50 y.o. year old male who is a primary care patient of Leone Haven, MD and is actively engaged with the care management team. I reached out to Brad Jimenez. by phone today to assist with re-scheduling a follow up visit with the Pharmacist  Follow up plan: Telephone appointment with care management team member scheduled for:09/12/2020  Noreene Larsson, [place], [place], [place] [address] Direct Dial: [phone] [email or web address] Website: [email or web address]

## 2020-09-06 ENCOUNTER — Encounter: Payer: Self-pay | Admitting: Family Medicine

## 2020-09-06 ENCOUNTER — Other Ambulatory Visit: Payer: Self-pay

## 2020-09-06 ENCOUNTER — Ambulatory Visit (INDEPENDENT_AMBULATORY_CARE_PROVIDER_SITE_OTHER): Payer: Medicare HMO | Admitting: Family Medicine

## 2020-09-06 VITALS — BP 118/70 | HR 87 | Temp 98.7°F | Ht 68.0 in | Wt 215.0 lb

## 2020-09-06 DIAGNOSIS — E119 Type 2 diabetes mellitus without complications: Secondary | ICD-10-CM

## 2020-09-06 DIAGNOSIS — Z23 Encounter for immunization: Secondary | ICD-10-CM

## 2020-09-06 DIAGNOSIS — E7800 Pure hypercholesterolemia, unspecified: Secondary | ICD-10-CM

## 2020-09-06 LAB — HEMOGLOBIN A1C: Hgb A1c MFr Bld: 7.9 % — ABNORMAL HIGH (ref 4.6–6.5)

## 2020-09-06 NOTE — Progress Notes (Signed)
Brad Rumps, MD Phone: [phone]  Brad Jimenez. is a 50 y.o. male who presents today for f/u.  DIABETES Disease Monitoring: Blood Sugar ranges-140 fasting Polyuria/phagia/dipsia- no      Optho- due Medications: Compliance- taking farxiga and ozempic Hypoglycemic symptoms- no He notes the abdominal discomfort he had previously resolved.   HYPERLIPIDEMIA Symptoms Chest pain on exertion:  no    Medications: Compliance- taking crestor Right upper quadrant pain- no  Muscle aches- no    Social History   Tobacco Use  Smoking Status Former Smoker  . Packs/day: 1.50  . Years: 22.00  . Pack years: 33.00  . Types: Cigarettes  . Quit date: 02/20/2011  . Years since quitting: 9.5  Smokeless Tobacco Never Used    Current Outpatient Medications on File Prior to Visit  Medication Sig Dispense Refill  . blood glucose meter kit and supplies KIT Dispense based on patient and insurance preference. Use once daily as directed. (FOR ICD-10 E11.9). 1 each 0  . dapagliflozin propanediol (FARXIGA) 10 MG TABS tablet Take 1 tablet (10 mg total) by mouth daily before breakfast. 14 tablet 0  . ezetimibe (ZETIA) 10 MG tablet TAKE 1 TABLET EVERY DAY 90 tablet 3  . Multiple Vitamin (MULTIVITAMIN WITH MINERALS) TABS tablet Take 1 tablet by mouth daily.    . naproxen sodium (ALEVE) 220 MG tablet Take 2 tablets by mouth daily as needed.    Marland Kitchen OZEMPIC, 0.25 OR 0.5 MG/DOSE, 2 MG/1.5ML SOPN Inject 0.5 mg into the skin once a week.    . rosuvastatin (CRESTOR) 40 MG tablet TAKE 1 TABLET EVERY DAY 90 tablet 1  . sertraline (ZOLOFT) 50 MG tablet TAKE 1 TABLET EVERY DAY 90 tablet 1  . TRUE METRIX BLOOD GLUCOSE TEST test strip TEST ONE TIME DAILY AS DIRECTED 100 strip 3   No current facility-administered medications on file prior to visit.     ROS see history of present illness  Objective  Physical Exam Vitals:   09/06/20 1205  BP: 118/70  Pulse: 87  Temp: 98.7 F (37.1 C)    BP Readings  from Last 3 Encounters:  09/06/20 118/70  06/06/20 120/70  02/03/20 120/80   Wt Readings from Last 3 Encounters:  09/06/20 215 lb (97.5 kg)  06/06/20 219 lb (99.3 kg)  02/03/20 229 lb 3.2 oz (104 kg)    Physical Exam Constitutional:      General: He is not in acute distress.    Appearance: He is not diaphoretic.  Cardiovascular:     Rate and Rhythm: Normal rate and regular rhythm.     Heart sounds: Normal heart sounds.  Pulmonary:     Effort: Pulmonary effort is normal.     Breath sounds: Normal breath sounds.  Musculoskeletal:     Right lower leg: No edema.     Left lower leg: No edema.  Skin:    General: Skin is warm and dry.  Neurological:     Mental Status: He is alert.      Assessment/Plan: Please see individual problem list.  Problem List Items Addressed This Visit    Diabetes ([place])    Check A1c.  He will continue Farxiga 10 mg daily and Ozempic 0.5 mg once weekly.  Discussed the potential for increasing the Ozempic if needed.  I advised that he would need to monitor for recurrence of abdominal pain if we had to increase the dose.      Relevant Orders   Comp Met (CMET)  HgB A1c   Elevated LDL cholesterol level    Continue Crestor 40 mg once daily and Zetia 10 mg once daily.  Check lipid panel and CMP.      Relevant Orders   Comp Met (CMET)   Lipid panel       Health Maintenance: The patient is due for Prevnar 20.  This will be given today.  I encouraged him to get a second booster vaccination for COVID.  The patient was encouraged to schedule an appointment with his eye doctor.  Return in about 3 months (around 12/07/2020).  This visit occurred during the SARS-CoV-2 public health emergency.  Safety protocols were in place, including screening questions prior to the visit, additional usage of staff PPE, and extensive cleaning of exam room while observing appropriate contact time as indicated for disinfecting solutions.    Brad Rumps, MD [place]

## 2020-09-06 NOTE — Assessment & Plan Note (Signed)
Continue Crestor 40 mg once daily and Zetia 10 mg once daily.  Check lipid panel and CMP.

## 2020-09-06 NOTE — Assessment & Plan Note (Signed)
Check A1c.  He will continue Farxiga 10 mg daily and Ozempic 0.5 mg once weekly.  Discussed the potential for increasing the Ozempic if needed.  I advised that he would need to monitor for recurrence of abdominal pain if we had to increase the dose.

## 2020-09-06 NOTE — Patient Instructions (Signed)
Nice to see you. We will get labs and contact you with the results.  Please see your eye doctor.

## 2020-09-06 NOTE — Addendum Note (Signed)
Addended by: Fulton Mole D on: 09/06/2020 12:38 PM   Modules accepted: Orders

## 2020-09-07 LAB — COMPREHENSIVE METABOLIC PANEL
ALT: 45 U/L (ref 0–53)
AST: 24 U/L (ref 0–37)
Albumin: 4.6 g/dL (ref 3.5–5.2)
Alkaline Phosphatase: 99 U/L (ref 39–117)
BUN: 15 mg/dL (ref 6–23)
CO2: 23 mEq/L (ref 19–32)
Calcium: 9.6 mg/dL (ref 8.4–10.5)
Chloride: 102 mEq/L (ref 96–112)
Creatinine, Ser: 0.79 mg/dL (ref 0.40–1.50)
GFR: 103.23 mL/min (ref >60.00)
Glucose, Bld: 175 mg/dL — ABNORMAL HIGH (ref 70–99)
Potassium: 4 mEq/L (ref 3.5–5.1)
Sodium: 137 mEq/L (ref 135–145)
Total Bilirubin: 0.5 mg/dL (ref 0.2–1.2)
Total Protein: 7.5 g/dL (ref 6.0–8.3)

## 2020-09-07 LAB — LDL CHOLESTEROL, DIRECT: Direct LDL: 62 mg/dL

## 2020-09-07 LAB — LIPID PANEL
Cholesterol: 126 mg/dL (ref 0–200)
HDL: 42.9 mg/dL (ref >39.00)
NonHDL: 83.57
Total CHOL/HDL Ratio: 3
Triglycerides: 308 mg/dL — ABNORMAL HIGH (ref 0.0–149.0)
VLDL: 61.6 mg/dL — ABNORMAL HIGH (ref 0.0–40.0)

## 2020-09-12 ENCOUNTER — Ambulatory Visit (INDEPENDENT_AMBULATORY_CARE_PROVIDER_SITE_OTHER): Payer: Medicare HMO | Admitting: Pharmacist

## 2020-09-12 DIAGNOSIS — E785 Hyperlipidemia, unspecified: Secondary | ICD-10-CM | POA: Diagnosis not present

## 2020-09-12 DIAGNOSIS — E119 Type 2 diabetes mellitus without complications: Secondary | ICD-10-CM

## 2020-09-12 DIAGNOSIS — E7800 Pure hypercholesterolemia, unspecified: Secondary | ICD-10-CM | POA: Diagnosis not present

## 2020-09-12 MED ORDER — OZEMPIC (1 MG/DOSE) 4 MG/3ML ~~LOC~~ SOPN
1.0000 mg | PEN_INJECTOR | SUBCUTANEOUS | 0 refills | Status: DC
Start: 2020-09-12 — End: 2022-02-07

## 2020-09-12 NOTE — Addendum Note (Signed)
Addended by: De Hollingshead on: 09/12/2020 04:04 PM   Modules accepted: Orders

## 2020-09-12 NOTE — Patient Instructions (Signed)
Visit Information  PATIENT GOALS:  Goals Addressed               This Visit's Progress     Medication Monitoring (pt-stated)        Patient Goals/Self-Care Activities Over the next 90 days, patient will:  - take medications as prescribed - check glucose daily, document, and provide at future appointments - collaborate with provider on medication access solutions         Patient verbalizes understanding of instructions provided today and agrees to view in [place].   Telephone follow up appointment with care management team member scheduled for: ~6 weeks  Lorel Monaco, PharmD, [place]

## 2020-09-12 NOTE — Chronic Care Management (AMB) (Addendum)
Chronic Care Management Pharmacy Note  09/12/2020 Name:  Brad Jimenez. MRN:  [id number] DOB:  08/01/1970  Subjective: Brad Bonn. is an 50 y.o. year old male who is a primary patient of Caryl Bis, Angela Adam, MD.  The CCM team was consulted for assistance with disease management and care coordination needs.    Engaged with patient by telephone for follow up visit in response to provider referral for pharmacy case management and/or care coordination services.   Consent to Services:  The patient was given information about Chronic Care Management services, agreed to services, and gave verbal consent prior to initiation of services.  Please see initial visit note for detailed documentation.   Patient Care Team: Leone Haven, MD as PCP - General (Family Medicine) De Hollingshead, RPH-CPP (Pharmacist)  Recent office visits: 6/7: [place] w/PCP  Fasting BG 140s  No lows Discussed the potential for increasing the Ozempic if needed pending A1c results  Recent consult visits: None since last visit  Hospital visits: None in previous 6 months  Objective:  Lab Results  Component Value Date   CREATININE 0.79 09/06/2020   CREATININE 0.78 02/03/2020   CREATININE 0.87 08/10/2019    Lab Results  Component Value Date   HGBA1C 7.9 (H) 09/06/2020   Last diabetic Eye exam:  Lab Results  Component Value Date/Time   HMDIABEYEEXA No Retinopathy 02/18/2017 12:00 AM    Last diabetic Foot exam: No results found for: HMDIABFOOTEX      Component Value Date/Time   CHOL 126 09/06/2020 1221   TRIG 308.0 (H) 09/06/2020 1221   HDL 42.90 09/06/2020 1221   CHOLHDL 3 09/06/2020 1221   VLDL 61.6 (H) 09/06/2020 1221   LDLCALC 65 05/08/2016 1046   LDLDIRECT 62.0 09/06/2020 1221    Hepatic Function Latest Ref Rng & Units 09/06/2020 06/06/2020 04/28/2019  Total Protein 6.0 - 8.3 g/dL 7.5 7.8 7.5  Albumin 3.5 - 5.2 g/dL 4.6 4.6 4.7  AST 0 - 37 U/L _0 ALT 0 - 53 U/L 45 50 46  Alk  Phosphatase 39 - 117 U/L 99 83 86  Total Bilirubin 0.2 - 1.2 mg/dL 0.5 0.4 0.6  Bilirubin, Direct 0.0 - 0.3 mg/dL - 0.1 0.1    No results found for: TSH, FREET4  CBC Latest Ref Rng & Units 02/20/2015  WBC 3.8 - 10.6 K/uL 9.4  Hemoglobin 13.0 - 18.0 g/dL 15.3  Hematocrit 40.0 - 52.0 % 43.0  Platelets 150 - 440 K/uL 289    No results found for: VD25OH  Clinical ASCVD: No  The ASCVD Risk score Mikey Bussing DC Jr., et al., 2013) failed to calculate for the following reasons:   The valid total cholesterol range is 130 to 320 mg/dL    Social History   Tobacco Use  Smoking Status Former   Packs/day: 1.50   Years: 22.00   Pack years: 33.00   Types: Cigarettes   Quit date: 02/20/2011   Years since quitting: 9.5  Smokeless Tobacco Never   BP Readings from Last 3 Encounters:  09/06/20 118/70  06/06/20 120/70  02/03/20 120/80   Pulse Readings from Last 3 Encounters:  09/06/20 87  06/06/20 84  02/03/20 75   Wt Readings from Last 3 Encounters:  09/06/20 215 lb (97.5 kg)  06/06/20 219 lb (99.3 kg)  02/03/20 229 lb 3.2 oz (104 kg)    Assessment: Review of patient past medical history, allergies, medications, health status, including review of consultants  reports, laboratory and other test data, was performed as part of comprehensive evaluation and provision of chronic care management services.   SDOH:  (Social Determinants of Health) assessments and interventions performed:  SDOH Interventions    Flowsheet Row Most Recent Value  SDOH Interventions   Financial Strain Interventions Other (Comment)  [manufacturer assistance]       CCM Care Plan  Allergies  Allergen Reactions   Lipitor [Atorvastatin Calcium]     Severe muscle aches and pain.   Fentanyl Other (See Comments)    Hyperactivity   Metformin And Related Other (See Comments)    Blurry vision    Medications Reviewed Today     Reviewed by Avie Arenas, RPH (Pharmacist) on 09/12/20 at 1533  Med List Status: <None>    Medication Order Taking? Sig Documenting Provider Last Dose Status Informant  blood glucose meter kit and supplies KIT [id number]  Dispense based on patient and insurance preference. Use once daily as directed. (FOR ICD-10 E11.9). Leone Haven, MD  Active   dapagliflozin propanediol (FARXIGA) 10 MG TABS tablet [id number] Yes Take 1 tablet (10 mg total) by mouth daily before breakfast. Leone Haven, MD Taking Active   ezetimibe (ZETIA) 10 MG tablet [id number] Yes TAKE 1 TABLET EVERY DAY Leone Haven, MD Taking Active   Multiple Vitamin (MULTIVITAMIN WITH MINERALS) TABS tablet [id number] Yes Take 1 tablet by mouth daily. [provider] Taking Active   naproxen sodium (ALEVE) 220 MG tablet [id number] Yes Take 2 tablets by mouth daily as needed. [provider] Taking Active            Med Note Darnelle Jimenez, Brad E   Thu Jul 07, 2020  1:21 PM) Taking ~ 3-4 times weekly (back pain)  OZEMPIC, 0.25 OR 0.5 MG/DOSE, 2 MG/1.5ML SOPN [id number] Yes Inject 0.5 mg into the skin once a week. [provider] Taking Active   rosuvastatin (CRESTOR) 40 MG tablet [id number] Yes TAKE 1 TABLET EVERY DAY Leone Haven, MD Taking Active   sertraline (ZOLOFT) 50 MG tablet [id number] Yes TAKE 1 TABLET EVERY DAY Leone Haven, MD Taking Active   TRUE METRIX BLOOD GLUCOSE TEST test strip [id number]  TEST ONE TIME DAILY AS DIRECTED Leone Haven, MD  Active             Patient Active Problem List   Diagnosis Date Noted   RUQ pain 06/09/2020   Elbow pain 02/03/2020   Obesity 04/07/2019   De Quervain's tenosynovitis, right 01/19/2019   Tinea pedis 09/09/2018   Elevated BP without diagnosis of hypertension 11/27/2017   Nevus 08/28/2017   Plantar fasciitis of right foot 05/08/2016   Elevated LDL cholesterol level 06/07/2015   Thoracic back pain 04/13/2015   Chronic low back pain 03/22/2015   Anxiety and depression 03/22/2015   Diabetes ([place]) 03/21/2015     Immunization History  Administered Date(s) Administered   PFIZER(Purple Top)SARS-COV-2 Vaccination 06/29/2019, 07/22/2019   PNEUMOCOCCAL CONJUGATE-20 09/06/2020   Pneumococcal Polysaccharide-23 06/07/2015   Tdap 06/07/2015    Conditions to be addressed/monitored: HLD and DMII  Care Plan : Medication Management  Updates made by Lorel Monaco A, RPH since 09/12/2020 12:00 AM     Problem: Diabetes, Hyperlipidemia      Long-Range Goal: Disease Progression Prevention   Start Date: 07/07/2020  This Visit's Progress: On track  Recent Progress: On track  Priority: High  Note:   Current Barriers:  Unable to independently afford treatment regimen Unable to achieve  control of diabetes   Pharmacist Clinical Goal(s):  Over the next 90 days, patient will achieve control of diabetes as evidenced by A1c  through collaboration with PharmD and provider.   Interventions: 1:1 collaboration with Leone Haven, MD regarding development and update of comprehensive plan of care as evidenced by provider attestation and co-signature Inter-disciplinary care team collaboration (see longitudinal plan of care) Comprehensive medication review performed; medication list updated in electronic medical record  Health Maintenance: Due for COVID booster. Will discuss moving forward and encourage Due for eye exam. Patient notes he will call today to schedule.   Diabetes: Uncontrolled; current treatment: Farxiga 10 mg daily, Ozempic 0.5 mg weekly Denies any stomach upset, nausea, stomach pain lately with Ozempic injections.  Hx metformin - GI upset Current glucose readings: fastings ~160s, not checking other times.  Denies hypoglycemic episodes. Current meal patterns: Breakfast: pack of crackers, lunch: grilled chicken breast, mashed poatoes, pinto beans, arby's, tacos; supper: grilled chicken breast, mashed potatoes; spaghetti, meat (chicken, hamburger, steak, pork); drinks: unsweet tea + sweet and lo;  sugar free orangaid twist; some water (doesn't liked flavored water).  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 Doesn't formally exercise Approved for [place] patient assistance through 04/01/21; Approved for [place] patient assistance through 03/01/21 Encouraged to check some post prandial readings to more comprehensively evaluate glucose control. He verbalized understanding.  Recommended to increase Ozempic to 1 mg weekly. Will send an updated script to [employer] patient assistance. Continued Farxiga 10 mg daily  Hyperlipidemia: Controlled per last lipid panel; current treatment: rosuvastatin 40 mg, ezetimibe 10 mg daily Refill history up to date Medications previously tried: atorvastatin - muscle aches Elevated fasting triglycerides - counseled on lifestyle modifications including limiting carbohydrate, sugar and alcohol intake. Patient verbalized understanding. Recommended to continue current regimen at this time  Depression/Anxiety Controlled per patient report; current treatment: sertraline 50 mg daily   Recommended to continue current regimen at this time  Chronic Low Back Pain: Controlled per patient report; Current regimen: naproxen 220 mg PRN - using 1-2 tablets ~ 3-4 times weekly  Previously discussed long term concerns w/ NSAID dosing including renal, GI disease. Continue to follow renal fx, symptoms of GI concerns.   Supplements: Current treatment: Multivitamin  Patient Goals/Self-Care Activities Over the next 90 days, patient will:  - take medications as prescribed check glucose daily, document, and provide at future appointments collaborate with provider on medication access solutions  Follow Up Plan: Telephone follow up appointment with care management team member scheduled for: ~6 weeks       Medication Assistance:  [name] obtained through [employer] medication assistance program.  Enrollment ends 04/01/21.  Ozempic obtained through  [employer] medication assistance program.  Enrollment ends 03/01/21  Patient's preferred pharmacy is:  [facility], [place] - [address] [place] [address] Phone: [phone] Fax: [facility] - [place], [place] [address] Phone: [phone] Fax: [phone]  Follow Up:  Patient agrees to Care Plan and Follow-up.  Plan: Telephone follow up appointment with care management team member scheduled for:  ~6 weeks  Lorel Monaco, PharmD, BCPS PGY2 [place]    I was present for this visit and agree with the documentation by the resident as above.   Catie Darnelle Jimenez, PharmD, [place], [place] Clinical Pharmacist [employer] at [place]

## 2020-09-23 ENCOUNTER — Telehealth: Payer: Self-pay

## 2020-09-23 ENCOUNTER — Ambulatory Visit: Payer: Medicare HMO

## 2020-09-23 ENCOUNTER — Telehealth: Payer: Self-pay | Admitting: Family Medicine

## 2020-09-23 NOTE — Telephone Encounter (Signed)
Left message for patient to call back and reschedule Medicare Annual Wellness Visit (AWV) in office.   If not able to come in office, please offer to do virtually or by telephone.   Last AWV:09/23/2019  Please schedule at anytime with Nurse Health Advisor.

## 2020-09-23 NOTE — Telephone Encounter (Signed)
No answer when called patient for scheduled awv. Left message to reschedule appointment.

## 2020-10-05 ENCOUNTER — Ambulatory Visit: Payer: Medicare HMO | Admitting: Pharmacist

## 2020-10-05 DIAGNOSIS — E785 Hyperlipidemia, unspecified: Secondary | ICD-10-CM

## 2020-10-05 DIAGNOSIS — E7800 Pure hypercholesterolemia, unspecified: Secondary | ICD-10-CM

## 2020-10-05 DIAGNOSIS — E119 Type 2 diabetes mellitus without complications: Secondary | ICD-10-CM

## 2020-10-05 NOTE — Chronic Care Management (AMB) (Signed)
Chronic Care Management Pharmacy Note  10/05/2020 Name:  Brad Jimenez. MRN:  [id number] DOB:  October 26, 1970  Subjective: Brad Jimenez. is an 50 y.o. year old male who is a primary patient of Caryl Bis, Angela Adam, MD.  The CCM team was consulted for assistance with disease management and care coordination needs.    Care coordination for  medication access  in response to provider referral for pharmacy case management and/or care coordination services.   Consent to Services:  The patient was given information about Chronic Care Management services, agreed to services, and gave verbal consent prior to initiation of services.  Please see initial visit note for detailed documentation.   Patient Care Team: Leone Haven, MD as PCP - General (Family Medicine) De Hollingshead, RPH-CPP (Pharmacist)   Objective:  Lab Results  Component Value Date   CREATININE 0.79 09/06/2020   CREATININE 0.78 02/03/2020   CREATININE 0.87 08/10/2019    Lab Results  Component Value Date   HGBA1C 7.9 (H) 09/06/2020   Last diabetic Eye exam:  Lab Results  Component Value Date/Time   HMDIABEYEEXA No Retinopathy 02/18/2017 12:00 AM    Last diabetic Foot exam: No results found for: HMDIABFOOTEX      Component Value Date/Time   CHOL 126 09/06/2020 1221   TRIG 308.0 (H) 09/06/2020 1221   HDL 42.90 09/06/2020 1221   CHOLHDL 3 09/06/2020 1221   VLDL 61.6 (H) 09/06/2020 1221   LDLCALC 65 05/08/2016 1046   LDLDIRECT 62.0 09/06/2020 1221    Hepatic Function Latest Ref Rng & Units 09/06/2020 06/06/2020 04/28/2019  Total Protein 6.0 - 8.3 g/dL 7.5 7.8 7.5  Albumin 3.5 - 5.2 g/dL 4.6 4.6 4.7  AST 0 - 37 U/L 24 29 23   ALT 0 - 53 U/L 45 50 46  Alk Phosphatase 39 - 117 U/L 99 83 86  Total Bilirubin 0.2 - 1.2 mg/dL 0.5 0.4 0.6  Bilirubin, Direct 0.0 - 0.3 mg/dL - 0.1 0.1    No results found for: TSH, FREET4  CBC Latest Ref Rng & Units 02/20/2015  WBC 3.8 - 10.6 K/uL 9.4  Hemoglobin 13.0 - 18.0  g/dL 15.3  Hematocrit 40.0 - 52.0 % 43.0  Platelets 150 - 440 K/uL 289    No results found for: VD25OH  Clinical ASCVD: No  The ASCVD Risk score Mikey Bussing DC Jr., et al., 2013) failed to calculate for the following reasons:   The valid total cholesterol range is 130 to 320 mg/dL     Social History   Tobacco Use  Smoking Status Former   Packs/day: 1.50   Years: 22.00   Pack years: 33.00   Types: Cigarettes   Quit date: 02/20/2011   Years since quitting: 9.6  Smokeless Tobacco Never   BP Readings from Last 3 Encounters:  09/06/20 118/70  06/06/20 120/70  02/03/20 120/80   Pulse Readings from Last 3 Encounters:  09/06/20 87  06/06/20 84  02/03/20 75   Wt Readings from Last 3 Encounters:  09/06/20 215 lb (97.5 kg)  06/06/20 219 lb (99.3 kg)  02/03/20 229 lb 3.2 oz (104 kg)    Assessment: Review of patient past medical history, allergies, medications, health status, including review of consultants reports, laboratory and other test data, was performed as part of comprehensive evaluation and provision of chronic care management services.   SDOH:  (Social Determinants of Health) assessments and interventions performed:  SDOH Interventions    Flowsheet Row Most Recent  Value  SDOH Interventions   Financial Strain Interventions Other (Comment)  [manufacturer assistance]       CCM Care Plan  Allergies  Allergen Reactions   Lipitor [Atorvastatin Calcium]     Severe muscle aches and pain.   Fentanyl Other (See Comments)    Hyperactivity   Metformin And Related Other (See Comments)    Blurry vision    Medications Reviewed Today     Reviewed by Avie Arenas, RPH (Pharmacist) on 09/12/20 at 1533  Med List Status: <None>   Medication Order Taking? Sig Documenting Provider Last Dose Status Informant  blood glucose meter kit and supplies KIT [id number]  Dispense based on patient and insurance preference. Use once daily as directed. (FOR ICD-10 E11.9). Leone Haven,  MD  Active   dapagliflozin propanediol (FARXIGA) 10 MG TABS tablet [id number] Yes Take 1 tablet (10 mg total) by mouth daily before breakfast. Leone Haven, MD Taking Active   ezetimibe (ZETIA) 10 MG tablet [id number] Yes TAKE 1 TABLET EVERY DAY Leone Haven, MD Taking Active   Multiple Vitamin (MULTIVITAMIN WITH MINERALS) TABS tablet [id number] Yes Take 1 tablet by mouth daily. [provider] Taking Active   naproxen sodium (ALEVE) 220 MG tablet [id number] Yes Take 2 tablets by mouth daily as needed. [provider] Taking Active            Med Note Darnelle Maffucci, Lindalou Soltis E   Thu Jul 07, 2020  1:21 PM) Taking ~ 3-4 times weekly (back pain)  OZEMPIC, 0.25 OR 0.5 MG/DOSE, 2 MG/1.5ML SOPN [id number] Yes Inject 0.5 mg into the skin once a week. [provider] Taking Active   rosuvastatin (CRESTOR) 40 MG tablet [id number] Yes TAKE 1 TABLET EVERY DAY Leone Haven, MD Taking Active   sertraline (ZOLOFT) 50 MG tablet [id number] Yes TAKE 1 TABLET EVERY DAY Leone Haven, MD Taking Active   TRUE METRIX BLOOD GLUCOSE TEST test strip [id number]  TEST ONE TIME DAILY AS DIRECTED Leone Haven, MD  Active             Patient Active Problem List   Diagnosis Date Noted   RUQ pain 06/09/2020   Elbow pain 02/03/2020   Obesity 04/07/2019   De Quervain's tenosynovitis, right 01/19/2019   Tinea pedis 09/09/2018   Elevated BP without diagnosis of hypertension 11/27/2017   Nevus 08/28/2017   Plantar fasciitis of right foot 05/08/2016   Elevated LDL cholesterol level 06/07/2015   Thoracic back pain 04/13/2015   Chronic low back pain 03/22/2015   Anxiety and depression 03/22/2015   Diabetes ([place]) 03/21/2015    Immunization History  Administered Date(s) Administered   PFIZER(Purple Top)SARS-COV-2 Vaccination 06/29/2019, 07/22/2019   PNEUMOCOCCAL CONJUGATE-20 09/06/2020   Pneumococcal Polysaccharide-23 06/07/2015   Tdap 06/07/2015    Conditions to be  addressed/monitored: HLD and DMII  Care Plan : Medication Management  Updates made by De Hollingshead, RPH-CPP since 10/05/2020 12:00 AM     Problem: Diabetes, Hyperlipidemia      Long-Range Goal: Disease Progression Prevention   Start Date: 07/07/2020  This Visit's Progress: On track  Recent Progress: On track  Priority: High  Note:   Current Barriers:  Unable to independently afford treatment regimen Unable to achieve control of diabetes   Pharmacist Clinical Goal(s):  Over the next 90 days, patient will achieve control of diabetes as evidenced by A1c  through collaboration with PharmD and provider.   Interventions: 1:1 collaboration with Leone Haven, MD regarding  development and update of comprehensive plan of care as evidenced by provider attestation and co-signature Inter-disciplinary care team collaboration (see longitudinal plan of care) Comprehensive medication review performed; medication list updated in electronic medical record  Health Maintenance: Due for COVID booster. Will discuss moving forward and encourage Due for eye exam. Patient notes he will call today to schedule.   Diabetes: Uncontrolled; current treatment: Farxiga 10 mg daily, Ozempic 0.5 mg weekly - increasing to 1 mg weekly Denies any stomach upset, nausea, stomach pain lately with Ozempic injections.  Hx metformin - GI upset Contacted [facility] to follow up on status of 1 mg order. Order was processed 6/22, should arrive to our office within 10-14 business days of that date, so by next Wednesday. Patient notified.   Hyperlipidemia: Controlled per last lipid panel; current treatment: rosuvastatin 40 mg, ezetimibe 10 mg daily Medications previously tried: atorvastatin - muscle aches Previously recommended to continue current regimen at this time  Depression/Anxiety Controlled per patient report; current treatment: sertraline 50 mg daily   Previously recommended to continue current  regimen at this time  Chronic Low Back Pain: Controlled per patient report; Current regimen: naproxen 220 mg PRN - using 1-2 tablets ~ 3-4 times weekly  Previously discussed long term concerns w/ NSAID dosing including renal, GI disease. Continue to follow renal fx, symptoms of GI concerns.   Supplements: Current treatment: Multivitamin  Patient Goals/Self-Care Activities Over the next 90 days, patient will:  - take medications as prescribed check glucose daily, document, and provide at future appointments collaborate with provider on medication access solutions  Follow Up Plan: Telephone follow up appointment with care management team member scheduled for: ~3 weeks as previously scheduled      Medication Assistance: [name] obtained through [employer] medication assistance program.  Enrollment ends 04/01/21.  Ozempic obtained through [employer] medication assistance program.  Enrollment ends 03/01/21  Patient's preferred pharmacy is:  [facility] [address] [place] - [place] [address] Phone: [phone] Fax: [facility] - [place], [place] [address] Phone: [phone] Fax: [phone]   Follow Up:  Patient agrees to Care Plan and Follow-up.  Plan: Telephone follow up appointment with care management team member scheduled for:  ~ 3 weeks as previously scheduled  Catie Darnelle Maffucci, PharmD, [place], [place] Clinical Pharmacist [employer] at [employer] [phone]

## 2020-10-05 NOTE — Patient Instructions (Signed)
Visit Information  PATIENT GOALS:  Goals Addressed               This Visit's Progress     Patient Stated     Medication Monitoring (pt-stated)        Patient Goals/Self-Care Activities Over the next 90 days, patient will:  - take medications as prescribed - check glucose daily, document, and provide at future appointments - collaborate with provider on medication access solutions          Patient verbalizes understanding of instructions provided today and agrees to view in [place].    Plan: Telephone follow up appointment with care management team member scheduled for:  ~ 3 weeks as previously scheduled  Catie Darnelle Maffucci, PharmD, [place], [place] Clinical Pharmacist [employer] at [employer] [phone]

## 2020-10-11 ENCOUNTER — Telehealth: Payer: Self-pay

## 2020-10-11 NOTE — Telephone Encounter (Signed)
Led and LVM for the patient to call back to inform him that his Ozempic medication has arrived and he can pick up.  Shoshana Johal,cma

## 2020-10-27 ENCOUNTER — Telehealth: Payer: Medicare HMO

## 2020-10-31 ENCOUNTER — Telehealth: Payer: Medicare HMO

## 2020-11-01 ENCOUNTER — Telehealth: Payer: Self-pay | Admitting: Pharmacist

## 2020-11-01 ENCOUNTER — Ambulatory Visit (INDEPENDENT_AMBULATORY_CARE_PROVIDER_SITE_OTHER): Payer: Medicare HMO | Admitting: Pharmacist

## 2020-11-01 DIAGNOSIS — E119 Type 2 diabetes mellitus without complications: Secondary | ICD-10-CM

## 2020-11-01 DIAGNOSIS — F419 Anxiety disorder, unspecified: Secondary | ICD-10-CM | POA: Diagnosis not present

## 2020-11-01 DIAGNOSIS — F32A Depression, unspecified: Secondary | ICD-10-CM | POA: Diagnosis not present

## 2020-11-01 DIAGNOSIS — E785 Hyperlipidemia, unspecified: Secondary | ICD-10-CM

## 2020-11-01 DIAGNOSIS — G8929 Other chronic pain: Secondary | ICD-10-CM

## 2020-11-01 NOTE — Patient Instructions (Signed)
Brad Jimenez,   It was great talking with you today!  Check your blood sugars twice daily:  1) Fasting, first thing in the morning before breakfast and  2) 2 hours after your largest meal.   For a goal A1c of less than 7%, goal fasting readings are less than 130 and goal 2 hour after meal readings are less than 180.   Keep an eye on those carbohydrate portion sizes!  I've heard patients like cooking broccoli in the air fryer. Put a little bit of parmesan cheese on it - that's how I roast my broccoli in the oven, with some garlic powder, salt, pepper, and some paprika.   Take care,   Catie Darnelle Maffucci, PharmD [phone]  Visit Information  PATIENT GOALS:  Goals Addressed               This Visit's Progress     Patient Stated     Medication Monitoring (pt-stated)        Patient Goals/Self-Care Activities Over the next 90 days, patient will:  - take medications as prescribed - check glucose daily, document, and provide at future appointments - collaborate with provider on medication access solutions          Patient verbalizes understanding of instructions provided today and agrees to view in [place].   Plan: Telephone follow up appointment with care management team member scheduled for:  ~ 10 weeks  Catie Darnelle Maffucci, PharmD, [place], [place] Clinical Pharmacist [employer] at [employer] [phone]

## 2020-11-01 NOTE — Chronic Care Management (AMB) (Signed)
Chronic Care Management Pharmacy Note  11/01/2020 Name:  Brad Jimenez. MRN:  [id number] DOB:  1971/02/25  Subjective: Brad Jimenez. is an 50 y.o. year old male who is a primary patient of Caryl Bis, Angela Adam, MD.  The CCM team was consulted for assistance with disease management and care coordination needs.    Engaged with patient by telephone for follow up visit in response to provider referral for pharmacy case management and/or care coordination services.   Consent to Services:  The patient was given information about Chronic Care Management services, agreed to services, and gave verbal consent prior to initiation of services.  Please see initial visit note for detailed documentation.   Patient Care Team: Leone Haven, MD as PCP - General (Family Medicine) De Hollingshead, RPH-CPP (Pharmacist)   Objective:  Lab Results  Component Value Date   CREATININE 0.79 09/06/2020   CREATININE 0.78 02/03/2020   CREATININE 0.87 08/10/2019    Lab Results  Component Value Date   HGBA1C 7.9 (H) 09/06/2020   Last diabetic Eye exam:  Lab Results  Component Value Date/Time   HMDIABEYEEXA No Retinopathy 02/18/2017 12:00 AM    Last diabetic Foot exam: No results found for: HMDIABFOOTEX      Component Value Date/Time   CHOL 126 09/06/2020 1221   TRIG 308.0 (H) 09/06/2020 1221   HDL 42.90 09/06/2020 1221   CHOLHDL 3 09/06/2020 1221   VLDL 61.6 (H) 09/06/2020 1221   LDLCALC 65 05/08/2016 1046   LDLDIRECT 62.0 09/06/2020 1221    Hepatic Function Latest Ref Rng & Units 09/06/2020 06/06/2020 04/28/2019  Total Protein 6.0 - 8.3 g/dL 7.5 7.8 7.5  Albumin 3.5 - 5.2 g/dL 4.6 4.6 4.7  AST 0 - 37 U/L _0 ALT 0 - 53 U/L 45 50 46  Alk Phosphatase 39 - 117 U/L 99 83 86  Total Bilirubin 0.2 - 1.2 mg/dL 0.5 0.4 0.6  Bilirubin, Direct 0.0 - 0.3 mg/dL - 0.1 0.1    No results found for: TSH, FREET4  CBC Latest Ref Rng & Units 02/20/2015  WBC 3.8 - 10.6 K/uL 9.4  Hemoglobin  13.0 - 18.0 g/dL 15.3  Hematocrit 40.0 - 52.0 % 43.0  Platelets 150 - 440 K/uL 289    No results found for: VD25OH  Clinical ASCVD: No  The ASCVD Risk score Mikey Bussing DC Jr., et al., 2013) failed to calculate for the following reasons:   The valid total cholesterol range is 130 to 320 mg/dL     Social History   Tobacco Use  Smoking Status Former   Packs/day: 1.50   Years: 22.00   Pack years: 33.00   Types: Cigarettes   Quit date: 02/20/2011   Years since quitting: 9.7  Smokeless Tobacco Never   BP Readings from Last 3 Encounters:  09/06/20 118/70  06/06/20 120/70  02/03/20 120/80   Pulse Readings from Last 3 Encounters:  09/06/20 87  06/06/20 84  02/03/20 75   Wt Readings from Last 3 Encounters:  09/06/20 215 lb (97.5 kg)  06/06/20 219 lb (99.3 kg)  02/03/20 229 lb 3.2 oz (104 kg)    Assessment: Review of patient past medical history, allergies, medications, health status, including review of consultants reports, laboratory and other test data, was performed as part of comprehensive evaluation and provision of chronic care management services.   SDOH:  (Social Determinants of Health) assessments and interventions performed:  SDOH Interventions    Flowsheet Row  Most Recent Value  SDOH Interventions   Financial Strain Interventions Other (Comment)  [manufacturer assistance]       CCM Care Plan  Allergies  Allergen Reactions   Lipitor [Atorvastatin Calcium]     Severe muscle aches and pain.   Fentanyl Other (See Comments)    Hyperactivity   Metformin And Related Other (See Comments)    Blurry vision    Medications Reviewed Today     Reviewed by De Hollingshead, RPH-CPP (Pharmacist) on 11/01/20 at 1100  Med List Status: <None>   Medication Order Taking? Sig Documenting Provider Last Dose Status Informant  blood glucose meter kit and supplies KIT [id number]  Dispense based on patient and insurance preference. Use once daily as directed. (FOR ICD-10  E11.9). Leone Haven, MD  Active   dapagliflozin propanediol (FARXIGA) 10 MG TABS tablet [id number] Yes Take 1 tablet (10 mg total) by mouth daily before breakfast. Leone Haven, MD Taking Active   ezetimibe (ZETIA) 10 MG tablet [id number] Yes TAKE 1 TABLET EVERY DAY Leone Haven, MD Taking Active   Multiple Vitamin (MULTIVITAMIN WITH MINERALS) TABS tablet [id number] Yes Take 1 tablet by mouth daily. [provider] Taking Active   naproxen sodium (ALEVE) 220 MG tablet [id number] Yes Take 2 tablets by mouth daily as needed. [provider] Taking Active            Med Note Darnelle Jimenez, Keanan Melander E   Thu Jul 07, 2020  1:21 PM) Taking ~ 3-4 times weekly (back pain)  rosuvastatin (CRESTOR) 40 MG tablet [id number] Yes TAKE 1 TABLET EVERY DAY Leone Haven, MD Taking Active   Semaglutide, 1 MG/DOSE, (OZEMPIC, 1 MG/DOSE,) 4 MG/3ML SOPN [id number] Yes Inject 1 mg into the skin once a week. Leone Haven, MD Taking Active   sertraline (ZOLOFT) 50 MG tablet [id number] Yes TAKE 1 TABLET EVERY DAY Leone Haven, MD Taking Active   TRUE METRIX BLOOD GLUCOSE TEST test strip [id number] Yes TEST ONE TIME DAILY AS DIRECTED Leone Haven, MD Taking Active             Patient Active Problem List   Diagnosis Date Noted   RUQ pain 06/09/2020   Elbow pain 02/03/2020   Obesity 04/07/2019   De Quervain's tenosynovitis, right 01/19/2019   Tinea pedis 09/09/2018   Elevated BP without diagnosis of hypertension 11/27/2017   Nevus 08/28/2017   Plantar fasciitis of right foot 05/08/2016   Elevated LDL cholesterol level 06/07/2015   Thoracic back pain 04/13/2015   Chronic low back pain 03/22/2015   Anxiety and depression 03/22/2015   Diabetes ([place]) 03/21/2015    Immunization History  Administered Date(s) Administered   PFIZER(Purple Top)SARS-COV-2 Vaccination 06/29/2019, 07/22/2019   PNEUMOCOCCAL CONJUGATE-20 09/06/2020   Pneumococcal Polysaccharide-23 06/07/2015    Tdap 06/07/2015    Conditions to be addressed/monitored: HLD and DMII  Care Plan : Medication Management  Updates made by De Hollingshead, RPH-CPP since 11/01/2020 12:00 AM     Problem: Diabetes, Hyperlipidemia      Long-Range Goal: Disease Progression Prevention   Start Date: 07/07/2020  This Visit's Progress: On track  Recent Progress: On track  Priority: High  Note:   Current Barriers:  Unable to independently afford treatment regimen Unable to achieve control of diabetes   Pharmacist Clinical Goal(s):  Over the next 90 days, patient will achieve control of diabetes as evidenced by A1c  through collaboration with PharmD and provider.   Interventions: 1:1 collaboration with Caryl Bis,  Angela Adam, MD regarding development and update of comprehensive plan of care as evidenced by provider attestation and co-signature Inter-disciplinary care team collaboration (see longitudinal plan of care) Comprehensive medication review performed; medication list updated in electronic medical record  Health Maintenance: Due for COVID booster. Will discuss moving forward  Due for Shingrix. Will discuss moving forward. Due for eye exam. Previously encouraged.   Diabetes: Uncontrolled but improved; current treatment: Farxiga 10 mg daily, Ozempic 1 mg weekly Does note some constipation since increasing Ozempic.  Hx metformin - GI upset Current glucose readings: fastings: 114-140s, though mostly <130; as not been regularly checking post prandials Current meal patterns: breakfast: pack of crackers, sometimes small bowl of cereal; lunch: cheeseburger and fries, chicken breast, pintos, mashed potatoes; pork chops, soft taco w/ grilled chicken and cheese; sometimes frozen pizza (2-3 slices); supper: chicken pie, grilled chicken in air fryer, hamburgers in air fryer, hot dog in air fryers; fish sticks, tater tots; not a lot of vegetables; drinks: unsweet tea, water, sugar free orangeaid; has tried  crystal lite  Current physical activity: piddles in the shop all day, no formal exercise.  Continue current regimen at this time.  Extensive dietary discussion. Discussed monitoring carbohydrate portion sizes. Discussed adding post prandial checks to more fully evaluate glycemic control. Discussed increasing hydration, fiber intake to help with constipation.   Hyperlipidemia: Controlled per last lipid panel; current treatment: rosuvastatin 40 mg, ezetimibe 10 mg daily Medications previously tried: atorvastatin - muscle aches Recommended to continue current regimen at this time  Depression/Anxiety Controlled per patient report; current treatment: sertraline 50 mg daily   Previously recommended to continue current regimen at this time  Chronic Low Back Pain: Controlled per patient report; Current regimen: naproxen 220 mg PRN - using 1-2 tablets ~ 3-4 times weekly  Previously discussed long term concerns w/ NSAID dosing including renal, GI disease. Continue to follow renal fx, symptoms of GI concerns.   Supplements: Current treatment: Multivitamin  Patient Goals/Self-Care Activities Over the next 90 days, patient will:  - take medications as prescribed check glucose daily, document, and provide at future appointments collaborate with provider on medication access solutions  Follow Up Plan: Telephone follow up appointment with care management team member scheduled for: ~ 10 weeks      Medication Assistance:  Franne Grip obtained through [employer], Firefighter medication assistance program.  Enrollment ends 03/01/21; 04/01/21  Patient's preferred pharmacy is:  [facility] [address] [place] [address] Phone: [phone] Fax: [facility] - [place], [place] [address] Phone: [phone] Fax:  [phone]   Follow Up:  Patient agrees to Care Plan and Follow-up.  Plan: Telephone follow up appointment with care management team member scheduled for:  ~ 10 weeks  Brad Jimenez, PharmD, [place], [place] Clinical Pharmacist [employer] [facility] [phone]

## 2020-11-01 NOTE — Telephone Encounter (Signed)
  Chronic Care Management   Note  11/01/2020 Name: Brad Jimenez. MRN: [id number] DOB: 08-Feb-1971   Attempted to contact patient for scheduled appointment for medication management support. Left HIPAA compliant message for patient to return my call at their convenience.    Plan: - If I do not hear back from the patient by end of business today, will collaborate with Care Guide to outreach to schedule follow up with me  Catie Darnelle Maffucci, PharmD, [place], [place] Pharmacist [employer] [facility] [phone]

## 2020-11-01 NOTE — Telephone Encounter (Signed)
Patient returned call. See CCM documentation 

## 2020-12-12 ENCOUNTER — Ambulatory Visit: Payer: Medicare HMO | Admitting: Family Medicine

## 2020-12-30 ENCOUNTER — Ambulatory Visit: Payer: Medicare HMO

## 2021-01-17 ENCOUNTER — Telehealth: Payer: Self-pay

## 2021-01-17 NOTE — Telephone Encounter (Signed)
I called and informed the patient that his Ozempic has arrived and is ready to be picked up and he stated he would come and get it tomorrow.  Brad Jimenez,cma

## 2021-01-23 ENCOUNTER — Ambulatory Visit (INDEPENDENT_AMBULATORY_CARE_PROVIDER_SITE_OTHER): Payer: Medicare HMO | Admitting: Family Medicine

## 2021-01-23 ENCOUNTER — Encounter: Payer: Self-pay | Admitting: Family Medicine

## 2021-01-23 ENCOUNTER — Telehealth: Payer: Medicare HMO

## 2021-01-23 ENCOUNTER — Other Ambulatory Visit: Payer: Self-pay

## 2021-01-23 VITALS — BP 118/80 | HR 74 | Temp 98.0°F | Ht 68.0 in | Wt 210.0 lb

## 2021-01-23 DIAGNOSIS — E7800 Pure hypercholesterolemia, unspecified: Secondary | ICD-10-CM

## 2021-01-23 DIAGNOSIS — E119 Type 2 diabetes mellitus without complications: Secondary | ICD-10-CM | POA: Diagnosis not present

## 2021-01-23 LAB — POCT GLYCOSYLATED HEMOGLOBIN (HGB A1C): Hemoglobin A1C: 7.6 % — AB (ref 4.0–5.6)

## 2021-01-23 NOTE — Progress Notes (Signed)
Brad Rumps, MD Phone: [phone]  Brad Dani. is a 50 y.o. male who presents today for f/u.  DIABETES Disease Monitoring: Blood Sugar ranges-130-140s fasting, 1 time it was 220 two hours post prandial though that is the only time he has checked postprandial Polyuria/phagia/dipsia- no      Optho- scheduled for november Medications: Compliance- taking farxiga, ozempic Hypoglycemic symptoms- no  HYPERLIPIDEMIA Symptoms Chest pain on exertion:  no   Leg claudication:   no Medications: Compliance- taking crestor and zetia Right upper quadrant pain- no  Muscle aches- no Lipid Panel     Component Value Date/Time   CHOL 126 09/06/2020 1221   TRIG 308.0 (H) 09/06/2020 1221   HDL 42.90 09/06/2020 1221   CHOLHDL 3 09/06/2020 1221   VLDL 61.6 (H) 09/06/2020 1221   [place] 65 05/08/2016 1046   LDLDIRECT 62.0 09/06/2020 1221      Social History   Tobacco Use  Smoking Status Former   Packs/day: 1.50   Years: 22.00   Pack years: 33.00   Types: Cigarettes   Quit date: 02/20/2011   Years since quitting: 9.9  Smokeless Tobacco Never    Current Outpatient Medications on File Prior to Visit  Medication Sig Dispense Refill   blood glucose meter kit and supplies KIT Dispense based on patient and insurance preference. Use once daily as directed. (FOR ICD-10 E11.9). 1 each 0   dapagliflozin propanediol (FARXIGA) 10 MG TABS tablet Take 1 tablet (10 mg total) by mouth daily before breakfast. 14 tablet 0   ezetimibe (ZETIA) 10 MG tablet TAKE 1 TABLET EVERY DAY 90 tablet 3   Multiple Vitamin (MULTIVITAMIN WITH MINERALS) TABS tablet Take 1 tablet by mouth daily.     naproxen sodium (ALEVE) 220 MG tablet Take 2 tablets by mouth daily as needed.     rosuvastatin (CRESTOR) 40 MG tablet TAKE 1 TABLET EVERY DAY 90 tablet 1   Semaglutide, 1 MG/DOSE, (OZEMPIC, 1 MG/DOSE,) 4 MG/3ML SOPN Inject 1 mg into the skin once a week. 9 mL 0   sertraline (ZOLOFT) 50 MG tablet TAKE 1 TABLET EVERY  DAY 90 tablet 1   TRUE METRIX BLOOD GLUCOSE TEST test strip TEST ONE TIME DAILY AS DIRECTED 100 strip 3   No current facility-administered medications on file prior to visit.     ROS see history of present illness  Objective  Physical Exam Vitals:   01/23/21 1324  BP: 118/80  Pulse: 74  Temp: 98 F (36.7 C)  SpO2: 96%    BP Readings from Last 3 Encounters:  01/23/21 118/80  09/06/20 118/70  06/06/20 120/70   Wt Readings from Last 3 Encounters:  01/23/21 210 lb (95.3 kg)  09/06/20 215 lb (97.5 kg)  06/06/20 219 lb (99.3 kg)    Physical Exam Constitutional:      General: He is not in acute distress.    Appearance: He is not diaphoretic.  Cardiovascular:     Rate and Rhythm: Normal rate and regular rhythm.     Heart sounds: Normal heart sounds.  Pulmonary:     Effort: Pulmonary effort is normal.     Breath sounds: Normal breath sounds.  Musculoskeletal:     Right lower leg: No edema.     Left lower leg: No edema.  Skin:    General: Skin is warm and dry.  Neurological:     Mental Status: He is alert.     Assessment/Plan: Please see individual problem list.  Problem List Items  Addressed This Visit     Diabetes ([place]) - Primary    A1c is above goal.  I discussed increasing his Ozempic dose though he wants to work on his diet.  He has been eating larger portions of carbohydrates and has also been eating some candy.  He is going to try to reduce those intakes.  He will continue Ozempic 1 mg once weekly and Farxiga 10 mg daily.  He will follow-up in 3 months.      Relevant Orders   POCT HgB A1C   Elevated LDL cholesterol level    Well controlled on most recent labs. Continue crestor 40 mg daily and zetia 10 mg daily.         Health Maintenance: referred to GI for colon cancer screening. Encouraged to get newest COVID booster from the pharmacy. Encouraged to get shingrix from the pharmacy.   Return in about 3 months (around 04/25/2021) for DM.  This visit  occurred during the SARS-CoV-2 public health emergency.  Safety protocols were in place, including screening questions prior to the visit, additional usage of staff PPE, and extensive cleaning of exam room while observing appropriate contact time as indicated for disinfecting solutions.    Brad Rumps, MD [place]

## 2021-01-23 NOTE — Assessment & Plan Note (Signed)
A1c is above goal.  I discussed increasing his Ozempic dose though he wants to work on his diet.  He has been eating larger portions of carbohydrates and has also been eating some candy.  He is going to try to reduce those intakes.  He will continue Ozempic 1 mg once weekly and Farxiga 10 mg daily.  He will follow-up in 3 months.

## 2021-01-23 NOTE — Patient Instructions (Signed)
Nice to see you. Please work on your portion sizes and decreasing your sugary food intake as we discussed.

## 2021-01-23 NOTE — Assessment & Plan Note (Signed)
Well controlled on most recent labs. Continue crestor 40 mg daily and zetia 10 mg daily.

## 2021-01-25 ENCOUNTER — Other Ambulatory Visit: Payer: Self-pay | Admitting: Family Medicine

## 2021-01-25 DIAGNOSIS — E785 Hyperlipidemia, unspecified: Secondary | ICD-10-CM

## 2021-02-08 ENCOUNTER — Ambulatory Visit (INDEPENDENT_AMBULATORY_CARE_PROVIDER_SITE_OTHER): Payer: Medicare HMO

## 2021-02-08 VITALS — Ht 68.0 in | Wt 210.0 lb

## 2021-02-08 DIAGNOSIS — Z0000 Encounter for general adult medical examination without abnormal findings: Secondary | ICD-10-CM | POA: Diagnosis not present

## 2021-02-08 NOTE — Patient Instructions (Addendum)
  Mr. Laufer , Thank you for taking time to come for your Medicare Wellness Visit. I appreciate your ongoing commitment to your health goals. Please review the following plan we discussed and let me know if I can assist you in the future.   These are the goals we discussed:  Goals       Patient Stated     Follow up with Primary Care Provider (pt-stated)      As needed.  Maintain healthy lifestyle      Medication Monitoring (pt-stated)      Patient Goals/Self-Care Activities Over the next 90 days, patient will:  - take medications as prescribed - check glucose daily, document, and provide at future appointments - collaborate with provider on medication access solutions        Other     Portion control meals        This is a list of the screening recommended for you and due dates:  Health Maintenance  Topic Date Due   Colon Cancer Screening  Never done   Urine Protein Check  02/02/2021   Eye exam for diabetics  02/08/2021*   COVID-19 Vaccine (3 - Booster for Pfizer series) 02/24/2021*   Zoster (Shingles) Vaccine (1 of 2) 05/11/2021*   Flu Shot  06/30/2021*   Hepatitis C Screening: USPSTF Recommendation to screen - Ages 18-79 yo.  02/08/2022*   Complete foot exam   06/06/2021   Hemoglobin A1C  07/24/2021   Tetanus Vaccine  06/06/2025   Pneumococcal Vaccination  Completed   HIV Screening  Completed   HPV Vaccine  Aged Out  *Topic was postponed. The date shown is not the original due date.

## 2021-02-08 NOTE — Progress Notes (Signed)
Subjective:   Brad Jimenez. is a 50 y.o. male who presents for Medicare Annual/Subsequent preventive examination.  Review of Systems    No ROS.  Medicare Wellness Virtual Visit.  Visual/audio telehealth visit, UTA vital signs.   See social history for additional risk factors.   Cardiac Risk Factors include: advanced age (>75mn, >>75women);male gender;hypertension;diabetes mellitus     Objective:    Today's Vitals   02/08/21 1533  Weight: 210 lb (95.3 kg)  Height: _0  (1.727 m)   Body mass index is 31.93 kg/m.  Advanced Directives 02/08/2021 09/23/2019 09/22/2018 09/17/2017 09/14/2016 04/07/2015 02/20/2015  Does Patient Have a Medical Advance Directive? _1  No No  Would patient like information on creating a medical advance directive? No - Patient declined No - Patient declined No - Patient declined Yes (MAU/Ambulatory/Procedural Areas - Information given) Yes (MAU/Ambulatory/Procedural Areas - Information given) No - patient declined information No - patient declined information    Current Medications (verified) Outpatient Encounter Medications as of 02/08/2021  Medication Sig   blood glucose meter kit and supplies KIT Dispense based on patient and insurance preference. Use once daily as directed. (FOR ICD-10 E11.9).   dapagliflozin propanediol (FARXIGA) 10 MG TABS tablet Take 1 tablet (10 mg total) by mouth daily before breakfast.   ezetimibe (ZETIA) 10 MG tablet TAKE 1 TABLET EVERY DAY   Multiple Vitamin (MULTIVITAMIN WITH MINERALS) TABS tablet Take 1 tablet by mouth daily.   naproxen sodium (ALEVE) 220 MG tablet Take 2 tablets by mouth daily as needed.   rosuvastatin (CRESTOR) 40 MG tablet TAKE 1 TABLET EVERY DAY   Semaglutide, 1 MG/DOSE, (OZEMPIC, 1 MG/DOSE,) 4 MG/3ML SOPN Inject 1 mg into the skin once a week.   sertraline (ZOLOFT) 50 MG tablet TAKE 1 TABLET EVERY DAY   TRUE METRIX BLOOD GLUCOSE TEST test strip TEST ONE TIME DAILY AS DIRECTED   No  facility-administered encounter medications on file as of 02/08/2021.    Allergies (verified) Lipitor [atorvastatin calcium], Fentanyl, and Metformin and related   History: Past Medical History:  Diagnosis Date   Anxiety    Arthritis    Asthma    Chickenpox    COPD (chronic obstructive pulmonary disease) ([place]    Depression    Diabetes ([place]    History of blood transfusion    Low back pain    Past Surgical History:  Procedure Laterality Date   APPENDECTOMY  2002   BACK SURGERY  2001, 2002   CHOLECYSTECTOMY  2012   Family History  Problem Relation Age of Onset   Arthritis Mother    Lung cancer Mother    Heart disease Mother    Hypertension Mother    Diabetes Mother    Cancer Mother        Lung   Diabetes Sister    Tongue cancer Sister    Alcoholism Father    Stroke Father    Social History   Socioeconomic History   Marital status: Married    Spouse name: Not on file   Number of children: Not on file   Years of education: Not on file   Highest education level: Not on file  Occupational History   Not on file  Tobacco Use   Smoking status: Former    Packs/day: 1.50    Years: 22.00    Pack years: 33.00    Types: Cigarettes    Quit date: 02/20/2011    Years since quitting: 9.9  Smokeless tobacco: Never  Vaping Use   Vaping Use: Never used  Substance and Sexual Activity   Alcohol use: Yes    Alcohol/week: 2.0 standard drinks    Types: 2 Cans of beer per week    Comment: occasional   Drug use: No   Sexual activity: Yes  Other Topics Concern   Not on file  Social History Narrative   Not on file   Social Determinants of Health   Financial Resource Strain: Low Risk    Difficulty of Paying Living Expenses: Not very hard  Food Insecurity: No Food Insecurity   Worried About Charity fundraiser in the Last Year: Never true   Ran Out of Food in the Last Year: Never true  Transportation Needs: No Transportation Needs   Lack of Transportation (Medical):  No   Lack of Transportation (Non-Medical): No  Physical Activity: Insufficiently Active   Days of Exercise per Week: 3 days   Minutes of Exercise per Session: 20 min  Stress: No Stress Concern Present   Feeling of Stress : Not at all  Social Connections: Unknown   Frequency of Communication with Friends and Family: More than three times a week   Frequency of Social Gatherings with Friends and Family: More than three times a week   Attends Religious Services: Not on Electrical engineer or Organizations: Not on file   Attends Archivist Meetings: Not on file   Marital Status: Married    Tobacco Counseling Counseling given: Not Answered   Clinical Intake:  Pre-visit preparation completed: Yes        Diabetes: Yes (Followed by pcp)  How often do you need to have someone help you when you read instructions, pamphlets, or other written materials from your doctor or pharmacy?: 1 - Never   Interpreter Needed?: No      Activities of Daily Living In your present state of health, do you have any difficulty performing the following activities: 02/08/2021  Hearing? N  Vision? N  Difficulty concentrating or making decisions? N  Walking or climbing stairs? N  Dressing or bathing? N  Doing errands, shopping? N  Preparing Food and eating ? N  Using the Toilet? N  In the past six months, have you accidently leaked urine? N  Do you have problems with loss of bowel control? N  Managing your Medications? N  Managing your Finances? N  Housekeeping or managing your Housekeeping? N  Some recent data might be hidden    Patient Care Team: Leone Haven, MD as PCP - General (Family Medicine) De Hollingshead, RPH-CPP (Pharmacist)  Indicate any recent Medical Services you may have received from other than [facility] providers in the past year (date may be approximate).     Assessment:   This is a routine wellness examination for Brad Jimenez.  Virtual Visit via  Telephone Note  I connected with  Brad Jimenez. on 02/08/21 at  3:30 PM EST by telephone and verified that I am speaking with the correct person using two identifiers.  Location: Patient: home Provider: office Persons participating in the virtual visit: patient/Nurse Health Advisor   I discussed the limitations, risks, security and privacy concerns of performing an evaluation and management service by telephone and the availability of in person appointments. The patient expressed understanding and agreed to proceed.  Interactive audio and video telecommunications were attempted between this nurse and patient, however failed, due to patient having technical difficulties  OR patient did not have access to video capability.  We continued and completed visit with audio only.  Some vital signs may be absent or patient reported.   OBrien-Blaney, Sarahann Horrell L, LPN   Hearing/Vision screen Hearing Screening - Comments:: Patient is able to hear conversational tones without difficulty. No issues reported. Vision Screening - Comments:: Reading glasses only  Diabetic eye exam; no retinopathy reported  They have regular follow up with the ophthalmologist  Dietary issues and exercise activities discussed:   Healthy diet Good water intake   Goals Addressed             This Visit's Progress    Portion control meals         Depression Screen PHQ 2/9 Scores 02/08/2021 01/23/2021 02/03/2020 09/23/2019 08/10/2019 04/07/2019 01/19/2019  PHQ - 2 Score 0 0 0 0 0 0 0  PHQ- 9 Score - - - - - - -    Fall Risk Fall Risk  02/08/2021 01/23/2021 02/03/2020 09/23/2019 08/10/2019  Falls in the past year? 0 0 0 0 0  Number falls in past yr: 0 0 0 0 0  Follow up _0     FALL RISK PREVENTION PERTAINING TO THE HOME: Adequate lighting in your home to reduce risk of falls? No    ASSISTIVE DEVICES UTILIZED TO PREVENT FALLS: Use of a cane, walker or w/c? No   TIMED UP AND GO: Was the test performed? No . Virtual visit.   Cognitive Function: Patient is alert and oriented x3.  MMSE - Mini Mental State Exam 09/17/2017 09/14/2016  Orientation to time 5 5  Orientation to Place 5 5  Registration 3 3  Attention/ Calculation 5 5  Recall 3 3  Language- name 2 objects 2 2  Language- repeat 1 1  Language- follow 3 step command 3 3  Language- read & follow direction 1 1  Write a sentence 1 1  Copy design 1 1  Total score 30 30     6CIT Screen 02/08/2021 09/23/2019 09/22/2018  What Year? 0 points 0 points 0 points  What month? 0 points 0 points 0 points  What time? 0 points - 0 points  Count back from 20 - - 0 points  Months in reverse 0 points 0 points 0 points  Repeat phrase - 0 points 0 points  Total Score - - 0    Immunizations Immunization History  Administered Date(s) Administered   PFIZER(Purple Top)SARS-COV-2 Vaccination 06/29/2019, 07/22/2019   PNEUMOCOCCAL CONJUGATE-20 09/06/2020   Pneumococcal Polysaccharide-23 06/07/2015   Tdap 06/07/2015   Eye exam- scheduled 02/22/21.  Urine microalbumin- followed by pcp.  Shingrix Completed?: No.    Education has been provided regarding the importance of this vaccine. Patient has been advised to call insurance company to determine out of pocket expense if they have not yet received this vaccine. Advised may also receive vaccine at local pharmacy or Health Dept. Verbalized acceptance and understanding.  Screening Tests Health Maintenance  Topic Date Due   COLONOSCOPY (Pts 45-82yr Insurance coverage will need to be confirmed)  Never done   URINE MICROALBUMIN  02/02/2021   OPHTHALMOLOGY EXAM  02/08/2021 (Originally 06/16/2020)   COVID-19 Vaccine (3 - Booster for Pfizer series) 02/24/2021 (Originally 09/16/2019)   Zoster Vaccines- Shingrix (1 of 2) 05/11/2021 (Originally 09/08/2019)   INFLUENZA VACCINE   06/30/2021 (Originally 10/31/2020)   Hepatitis C Screening  02/08/2022 (Originally 09/08/1988)   FOOT  EXAM  06/06/2021   HEMOGLOBIN A1C  07/24/2021   TETANUS/TDAP  06/06/2025   Pneumococcal Vaccine 56-67 Years old  Completed   HIV Screening  Completed   HPV VACCINES  Aged Out   Health Maintenance Health Maintenance Due  Topic Date Due   COLONOSCOPY (Pts 45-64yr Insurance coverage will need to be confirmed)  Never done   URINE MICROALBUMIN  02/02/2021   Colonoscopy- scheduled 02/22/21.  Hepatitis C Screening: deferred.   Vision Screening: Recommended annual ophthalmology exams for early detection of glaucoma and other disorders of the eye.  Dental Screening: Recommended annual dental exams for proper oral hygiene  Community Resource Referral / Chronic Care Management: CRR required this visit?  No   CCM required this visit?  No      Plan:   Keep all routine maintenance appointments.   I have personally reviewed and noted the following in the patient's chart:   Medical and social history Use of alcohol, tobacco or illicit drugs  Current medications and supplements including opioid prescriptions. Patient is not currently taking opioid prescriptions. Functional ability and status Nutritional status Physical activity Advanced directives List of other physicians Hospitalizations, surgeries, and ER visits in previous 12 months Vitals Screenings to include cognitive, depression, and falls Referrals and appointments  In addition, I have reviewed and discussed with patient certain preventive protocols, quality metrics, and best practice recommendations. A written personalized care plan for preventive services as well as general preventive health recommendations were provided to patient.     OVarney Biles LPN   [phone]

## 2021-03-06 ENCOUNTER — Telehealth: Payer: Medicare HMO

## 2021-03-06 DIAGNOSIS — H524 Presbyopia: Secondary | ICD-10-CM | POA: Diagnosis not present

## 2021-03-06 LAB — HM DIABETES EYE EXAM

## 2021-03-07 ENCOUNTER — Telehealth: Payer: Medicare HMO

## 2021-03-13 ENCOUNTER — Telehealth: Payer: Self-pay | Admitting: Family Medicine

## 2021-03-13 NOTE — Telephone Encounter (Signed)
Patient dropped off insurance paper work that needs to be completed by Dr Caryl Bis. Papers are up front in Dr Ellen Henri color folder.

## 2021-03-14 NOTE — Telephone Encounter (Signed)
Form was put in the sign basket.  Nosson Wender,cma  

## 2021-03-14 NOTE — Telephone Encounter (Signed)
Form reviewed. Can you see if he has had any change to the status of his back pain? Is he still working a part-time job?

## 2021-03-20 ENCOUNTER — Ambulatory Visit (INDEPENDENT_AMBULATORY_CARE_PROVIDER_SITE_OTHER): Payer: Medicare HMO | Admitting: Pharmacist

## 2021-03-20 DIAGNOSIS — F419 Anxiety disorder, unspecified: Secondary | ICD-10-CM

## 2021-03-20 DIAGNOSIS — E785 Hyperlipidemia, unspecified: Secondary | ICD-10-CM

## 2021-03-20 DIAGNOSIS — E119 Type 2 diabetes mellitus without complications: Secondary | ICD-10-CM

## 2021-03-20 MED ORDER — DAPAGLIFLOZIN PROPANEDIOL 10 MG PO TABS: 10.0000 mg | ORAL_TABLET | Freq: Every day | ORAL | 3 refills | Status: DC

## 2021-03-20 NOTE — Patient Instructions (Signed)
Linnie,   It was great talking to you today!  We recommend you get the influenza vaccine for this season. Consider it for me!  We recommend you get the updated bivalent COVID-19 booster, at least 2 months after any prior doses. You may consider delaying a booster dose by 3 months from a prior episode of COVID-19 per the CDC.   You can find pharmacies that have this formulation in stock at [email or web address].   We also recommend you pursue the Shingrix (shingles) vaccine series. This will have a $0 copay on all Medicare plans starting in 2023.   You can get these done at [place] if you prefer that - their number is [phone]  Take care!  Catie Darnelle Maffucci, PharmD  Visit Information  Following are the goals we discussed today:  Patient Goals/Self-Care Activities Over the next 90 days, patient will:  - take medications as prescribed check glucose daily, document, and provide at future appointments collaborate with provider on medication access solutions        Plan: Telephone follow up appointment with care management team member scheduled for:  3 months   Catie Darnelle Maffucci, PharmD, [place], CPP Clinical Pharmacist [place] at [facility] [phone]     Please call the care guide team at [phone] if you need to cancel or reschedule your appointment.   Print copy of patient instructions, educational materials, and care plan provided in person.

## 2021-03-20 NOTE — Chronic Care Management (AMB) (Signed)
Chronic Care Management CCM Pharmacy Note  03/20/2021 Name:  Brad Jimenez. MRN:  [id number] DOB:  [birth year]  Summary: - Due to reapply for assistance for Ozempic and Farxiga  Recommendations/Changes made from today's visit: - Reviewed vaccine recommendations  Subjective: Brad Jimenez. is an 50 y.o. year old male who is a primary patient of Caryl Bis, Angela Adam, MD.  The CCM team was consulted for assistance with disease management and care coordination needs.    Engaged with patient by telephone for follow up visit for pharmacy case management and/or care coordination services.   Objective:  Medications Reviewed Today     Reviewed by De Hollingshead, RPH-CPP (Pharmacist) on 03/20/21 at 1424  Med List Status: <None>   Medication Order Taking? Sig Documenting Provider Last Dose Status Informant  blood glucose meter kit and supplies KIT [id number] Yes Dispense based on patient and insurance preference. Use once daily as directed. (FOR ICD-10 E11.9). Leone Haven, MD Taking Active   dapagliflozin propanediol (FARXIGA) 10 MG TABS tablet [id number] Yes Take 1 tablet (10 mg total) by mouth daily before breakfast. Leone Haven, MD Taking Active   ezetimibe (ZETIA) 10 MG tablet [id number] Yes TAKE 1 TABLET EVERY DAY Leone Haven, MD Taking Active   Multiple Vitamin (MULTIVITAMIN WITH MINERALS) TABS tablet [id number] Yes Take 1 tablet by mouth daily. [provider] Taking Active   naproxen sodium (ALEVE) 220 MG tablet [id number] Yes Take 2 tablets by mouth daily as needed. [provider] Taking Active            Med Note Darnelle Maffucci, Brenly Trawick E   Thu Jul 07, 2020  1:21 PM) Taking ~ 3-4 times weekly (back pain)  rosuvastatin (CRESTOR) 40 MG tablet [id number] Yes TAKE 1 TABLET EVERY DAY Leone Haven, MD Taking Active   Semaglutide, 1 MG/DOSE, (OZEMPIC, 1 MG/DOSE,) 4 MG/3ML SOPN [id number] Yes Inject 1 mg into the skin once a week. Leone Haven, MD  Taking Active   sertraline (ZOLOFT) 50 MG tablet [id number] Yes TAKE 1 TABLET EVERY DAY Leone Haven, MD Taking Active   TRUE METRIX BLOOD GLUCOSE TEST test strip [id number] Yes TEST ONE TIME DAILY AS DIRECTED Leone Haven, MD Taking Active             Pertinent Labs:   Lab Results  Component Value Date   HGBA1C 7.6 (A) 01/23/2021   Lab Results  Component Value Date   CHOL 126 09/06/2020   HDL 42.90 09/06/2020   LDLCALC 65 05/08/2016   LDLDIRECT 62.0 09/06/2020   TRIG 308.0 (H) 09/06/2020   CHOLHDL 3 09/06/2020   Lab Results  Component Value Date   CREATININE 0.79 09/06/2020   BUN 15 09/06/2020   NA 137 09/06/2020   K 4.0 09/06/2020   CL 102 09/06/2020   CO2 23 09/06/2020    SDOH:  (Social Determinants of Health) assessments and interventions performed:  SDOH Interventions    Flowsheet Row Most Recent Value  SDOH Interventions   Financial Strain Interventions Other (Comment)  [manufacturer assistance]       CCM Care Plan  Review of patient past medical history, allergies, medications, health status, including review of consultants reports, laboratory and other test data, was performed as part of comprehensive evaluation and provision of chronic care management services.   Care Plan : Medication Management  Updates made by De Hollingshead, RPH-CPP since 03/20/2021 12:00 AM     Problem: Diabetes, Hyperlipidemia  Long-Range Goal: Disease Progression Prevention   Start Date: 07/07/2020  Recent Progress: On track  Priority: High  Note:   Current Barriers:  Unable to independently afford treatment regimen Unable to achieve control of diabetes   Pharmacist Clinical Goal(s):  Over the next 90 days, patient will achieve control of diabetes as evidenced by A1c  through collaboration with PharmD and provider.   Interventions: 1:1 collaboration with Leone Haven, MD regarding development and update of comprehensive plan of care as  evidenced by provider attestation and co-signature Inter-disciplinary care team collaboration (see longitudinal plan of care) Comprehensive medication review performed; medication list updated in electronic medical record  Health Maintenance   Yearly diabetic eye exam: up to date  Yearly diabetic foot exam: up to date Urine microalbumin: due Yearly influenza vaccination: due - recommended to consider Td/Tdap vaccination: up to date Pneumonia vaccination: up to date COVID vaccinations: due - recommended to consider and pursue Shingrix vaccinations: due - recommended to pursue Colonoscopy: due - recommend to discuss with PCP at next visit   Diabetes: Uncontrolled but improved; current treatment: Farxiga 10 mg daily, Ozempic 1 mg weekly Hx metformin - GI upset Current glucose readings: fastings: 130s; 2 hour post prandial readings: <180, occasional elevations to >200 Current meal patterns: reports he is trying to minimize sweets, ice cream.  Current physical activity: piddles in the shop all day, no formal exercise.  Educated on benefit of increasing physical activity. Encouraged to incorporate physical activity into route-  Extensive dietary discussion. Discussed monitoring carbohydrate portion sizes. Reviewed goal A1c, goal fasting, and goal 2 hour post prandial glucose. Discussed continuing to focus on dietary and lifestyle modifications, however, recommend increasing Ozempic to 2 mg weekly if next A1c is not improved.  Will collaborate w/ CPhT, patient, and providers to reapply for patient assistance for [place] for 2023. Script sent for [employer].    Hyperlipidemia: Controlled per last lipid panel; current treatment: rosuvastatin 40 mg, ezetimibe 10 mg daily Medications previously tried: atorvastatin - muscle aches Recommended to continue current regimen at this time  Depression/Anxiety Controlled per patient report; current treatment: sertraline 50 mg daily   Previously  recommended to continue current regimen at this time  Chronic Low Back Pain: Controlled per patient report; Current regimen: naproxen 220 mg PRN - using 1-2 tablets ~ 3-4 times weekly  Previously discussed long term concerns w/ NSAID dosing including renal, GI disease. Continue to follow renal fx, symptoms of GI concerns.   Supplements: Current treatment: Multivitamin  Patient Goals/Self-Care Activities Over the next 90 days, patient will:  - take medications as prescribed check glucose daily, document, and provide at future appointments collaborate with provider on medication access solutions      Plan: Telephone follow up appointment with care management team member scheduled for:  3 months  Catie Darnelle Maffucci, PharmD, [place], [place] Pharmacist [employer] at [employer] [phone]

## 2021-03-21 NOTE — Telephone Encounter (Signed)
I called the patient and informed him that his paperwork was ready for pickup and he understood.  Clorinda Wyble,cma

## 2021-03-21 NOTE — Telephone Encounter (Signed)
Form signed. Please fill in our office information and make available to pick up.

## 2021-03-23 ENCOUNTER — Other Ambulatory Visit: Payer: Self-pay

## 2021-03-29 ENCOUNTER — Telehealth: Payer: Self-pay | Admitting: Family Medicine

## 2021-03-29 NOTE — Telephone Encounter (Signed)
Patient called in stated he has hives all over body and itches. Patient said has allergic reaction to something and never had this happened. Patient req to nurse xfer access nurse    Malena Timpone,cma

## 2021-03-29 NOTE — Telephone Encounter (Signed)
VM for patient to call back.  Decorian Schuenemann,cma

## 2021-03-29 NOTE — Telephone Encounter (Signed)
Patient called in stated he has hives all over body and itches. Patient said has allergic reaction to something and never had this happened. Patient req to nurse xfer access nurse

## 2021-03-29 NOTE — Telephone Encounter (Signed)
Please follow-up with the patient to see with the outcome of the access nurse phone call was.  He should be evaluated in person for this and could go to an urgent care or the walk-in clinic.

## 2021-03-30 ENCOUNTER — Telehealth: Payer: Self-pay

## 2021-03-30 NOTE — Telephone Encounter (Signed)
° ° °  Patient was giving instructions by Access nurse and he was treated at home. Mcadoo Muzquiz,cma

## 2021-03-31 NOTE — Telephone Encounter (Signed)
LVM for patient to call back.   Alessander Sikorski,cma  

## 2021-04-01 DIAGNOSIS — E785 Hyperlipidemia, unspecified: Secondary | ICD-10-CM

## 2021-04-01 DIAGNOSIS — F32A Depression, unspecified: Secondary | ICD-10-CM | POA: Diagnosis not present

## 2021-04-01 DIAGNOSIS — E119 Type 2 diabetes mellitus without complications: Secondary | ICD-10-CM | POA: Diagnosis not present

## 2021-04-01 DIAGNOSIS — F419 Anxiety disorder, unspecified: Secondary | ICD-10-CM | POA: Diagnosis not present

## 2021-04-05 ENCOUNTER — Telehealth: Payer: Self-pay | Admitting: Pharmacy Technician

## 2021-04-05 DIAGNOSIS — Z596 Low income: Secondary | ICD-10-CM

## 2021-04-05 NOTE — Progress Notes (Signed)
[facility])                                            [place] Team    04/05/2021  Brad Jimenez [birth year] [id number]                                      Medication Assistance Referral  Referral From: [name]   Medication/Company: [name] Patient application portion: patient should automatically be enrolled as patient was approved in 2022 and has Med D per AZ&ME program guidelines Provider application portion: Interoffice Mailed to Dr. Caryl Bis Provider address/fax verified via: Office website  Medication/Company: [name] / [employer] Patient application portion:  N/A embedded PharmD had signed in clinic Provider application portion: Interoffice Mailed to Dr. Caryl Bis Provider address/fax verified via: Office website  Received both patient and provider portion(s) of patient assistance application(s) for [employer]. Faxed completed application and required documents into [employer].   Since patient did not need to complete a re enrollment application for AZ&ME, Embedded PharmD escribed updated prescription into AZ&ME for 2023    [name]. [name], [name]  [phone]

## 2021-04-11 ENCOUNTER — Ambulatory Visit: Payer: Medicare HMO | Attending: Internal Medicine

## 2021-04-11 ENCOUNTER — Other Ambulatory Visit: Payer: Self-pay

## 2021-04-11 DIAGNOSIS — Z23 Encounter for immunization: Secondary | ICD-10-CM

## 2021-04-11 MED ORDER — PFIZER COVID-19 VAC BIVALENT 30 MCG/0.3ML IM SUSP
INTRAMUSCULAR | 0 refills | Status: DC
  Filled 2021-04-11: qty 0.3, 1d supply, fill #0

## 2021-04-11 NOTE — Progress Notes (Signed)
° °  [id number]  Name:  Brad Jimenez.    MRN: [id number] DOB: 05-08-1970  04/11/2021  Brad Jimenez was observed post Covid-19 immunization for 15 minutes without incident. He was provided with Vaccine Information Sheet and instruction to access the V-Safe system.   Brad Jimenez was instructed to call 911 with any severe reactions post vaccine: Difficulty breathing  Swelling of face and throat  A fast heartbeat  A bad rash all over body  Dizziness and weakness   Immunizations Administered     Name Date Dose VIS Date Route   Pfizer Covid-19 Vaccine Bivalent Booster 04/11/2021  2:51 PM 0.3 mL 11/30/2020 Intramuscular   Manufacturer: [place]   Lot: [id number]   [place]: [id number]       Covid-19 Vaccination Clinic  Name:  Brad Jimenez.    MRN: [id number] DOB: 1970-10-26  04/11/2021  Brad Jimenez was observed post Covid-19 immunization for 15 minutes without incident. He was provided with Vaccine Information Sheet and instruction to access the V-Safe system.   Brad Jimenez was instructed to call 911 with any severe reactions post vaccine: Difficulty breathing  Swelling of face and throat  A fast heartbeat  A bad rash all over body  Dizziness and weakness   Immunizations Administered     Name Date Dose VIS Date Route   Pfizer Covid-19 Vaccine Bivalent Booster 04/11/2021  2:51 PM 0.3 mL 11/30/2020 Intramuscular   Manufacturer: [place]   Lot: [id number]   [place]: [phone]

## 2021-04-18 ENCOUNTER — Telehealth: Payer: Self-pay | Admitting: Family Medicine

## 2021-04-18 DIAGNOSIS — E785 Hyperlipidemia, unspecified: Secondary | ICD-10-CM

## 2021-04-18 DIAGNOSIS — E119 Type 2 diabetes mellitus without complications: Secondary | ICD-10-CM

## 2021-04-18 NOTE — Telephone Encounter (Signed)
Pts wife called and stated her and husband are switching insuance compaines. Due to this they will need to switch their pharmacy mail order services. Pt needs following medications sent to [facility] mail order pharmacy:  ezetimibe (ZETIA) 10 MG tablet rosuvastatin (CRESTOR) 40 MG tablet sertraline (ZOLOFT) 50 MG tablet blood glucose meter kit and supplies KIT TRUE METRIX BLOOD GLUCOSE TEST test strip

## 2021-04-19 MED ORDER — TRUE METRIX BLOOD GLUCOSE TEST VI STRP: ORAL_STRIP | 3 refills | Status: DC

## 2021-04-19 MED ORDER — EZETIMIBE 10 MG PO TABS: 10.0000 mg | ORAL_TABLET | Freq: Every day | ORAL | 1 refills | Status: DC

## 2021-04-19 MED ORDER — BLOOD GLUCOSE MONITOR KIT: PACK | 0 refills | Status: DC

## 2021-04-19 MED ORDER — ROSUVASTATIN CALCIUM 40 MG PO TABS: 40.0000 mg | ORAL_TABLET | Freq: Every day | ORAL | 1 refills | Status: DC

## 2021-04-19 NOTE — Telephone Encounter (Signed)
Signed and placed back on your desk.

## 2021-04-19 NOTE — Addendum Note (Signed)
Addended by: Fulton Mole D on: 04/19/2021 03:03 PM   Modules accepted: Orders

## 2021-04-20 ENCOUNTER — Telehealth: Payer: Self-pay | Admitting: Pharmacy Technician

## 2021-04-20 DIAGNOSIS — Z596 Low income: Secondary | ICD-10-CM

## 2021-04-20 NOTE — Telephone Encounter (Signed)
Prescription for meter faxed to new pharmacy confirmation given.  Alanii Ramer,cma

## 2021-04-20 NOTE — Progress Notes (Signed)
[facility])                                            [place] Team    04/20/2021  [name]. November 04, 1970 [id number]  2 care coordination calls placed to [employer] in regard to [place] application and to [employer] in regard to [employer] application.  Spoke to Brad Jimenez at [employer] who informs patient is APPROVED 04/02/21-04/01/22. She informs medication will auto fill and ship to patient's home based on last fill date in 20022 and going forward.  Spoke to Brad Jimenez at [employer] who informs patient's application is still in the que to be processed and to check back at a later time for a determination for 2023.  Brad Jimenez, [place]  [phone]

## 2021-05-09 ENCOUNTER — Other Ambulatory Visit: Payer: Self-pay

## 2021-05-09 ENCOUNTER — Telehealth (INDEPENDENT_AMBULATORY_CARE_PROVIDER_SITE_OTHER): Payer: Medicare HMO | Admitting: Family

## 2021-05-09 VITALS — Wt 225.0 lb

## 2021-05-09 DIAGNOSIS — J0110 Acute frontal sinusitis, unspecified: Secondary | ICD-10-CM | POA: Insufficient documentation

## 2021-05-09 MED ORDER — AMOXICILLIN-POT CLAVULANATE 875-125 MG PO TABS: 1.0000 | ORAL_TABLET | Freq: Two times a day (BID) | ORAL | 0 refills | Status: DC

## 2021-05-09 NOTE — Progress Notes (Signed)
Virtual telephone visit    Virtual Visit via Telephone Note   This visit type was conducted due to national recommendations for restrictions regarding the COVID-19 Pandemic (e.g. social distancing) in an effort to limit this patient's exposure and mitigate transmission in our community. Due to his co-morbid illnesses, this patient is at least at moderate risk for complications without adequate follow up. This format is felt to be most appropriate for this patient at this time. The patient did not have access to video technology or had technical difficulties with video requiring transitioning to audio format only (telephone). Physical exam was limited to content and character of the telephone converstion. CMA was able to get the patient set up on a telephone visit.   Patient location: Home. Patient and provider in visit Provider location: Office  I discussed the limitations of evaluation and management by telemedicine and the availability of in person appointments. The patient expressed understanding and agreed to proceed.   Visit Date: 05/09/2021  Today's healthcare provider: Eugenia Pancoast, FNP     Subjective:    Patient ID: Brad Riches., male    DOB: 27-Oct-1970, 51 y.o.   MRN: [id number]  Chief Complaint  Patient presents with   Sore Throat    Onset of sx: 05/01/21   Cough   Ear Pain    R    Sore Throat  Associated symptoms include congestion, coughing and ear pain (right ear pain). Pertinent negatives include no shortness of breath.  Cough Associated symptoms include ear pain (right ear pain) and a sore throat. Pertinent negatives include no chest pain, chills, fever, shortness of breath or wheezing.   51 y/o male here with concerns via telephone visit, unable to get video working on his phone.   8 days ago started with sore throat, cough and right ear pain. He had pnd as well. Now seems to be getting worse and right ear pain a bit worse. Cough seems to be getting a  bit worse as well, occasional sputum that is clear mainly in the am. No sneezing. No chest congestion. Does c/o nasal congestion. Mild sinus pressure and a headache. Has not tested for covid.   No fever or chills.  Past Medical History:  Diagnosis Date   Anxiety    Arthritis    Asthma    Chickenpox    COPD (chronic obstructive pulmonary disease) ([place])    Depression    Diabetes ([place])    History of blood transfusion    Low back pain     Past Surgical History:  Procedure Laterality Date   APPENDECTOMY  2002   BACK SURGERY  2001, 2002   CHOLECYSTECTOMY  2012    Family History  Problem Relation Age of Onset   Arthritis Mother    Lung cancer Mother    Heart disease Mother    Hypertension Mother    Diabetes Mother    Cancer Mother        Lung   Diabetes Sister    Tongue cancer Sister    Alcoholism Father    Stroke Father     Social History   Socioeconomic History   Marital status: Married    Spouse name: Not on file   Number of children: Not on file   Years of education: Not on file   Highest education level: Not on file  Occupational History   Not on file  Tobacco Use   Smoking status: Former    Packs/day: 1.50  Years: 22.00    Pack years: 33.00    Types: Cigarettes    Quit date: 02/20/2011    Years since quitting: 10.2   Smokeless tobacco: Never  Vaping Use   Vaping Use: Never used  Substance and Sexual Activity   Alcohol use: Yes    Alcohol/week: 2.0 standard drinks    Types: 2 Cans of beer per week    Comment: occasional   Drug use: No   Sexual activity: Yes  Other Topics Concern   Not on file  Social History Narrative   Not on file   Social Determinants of Health   Financial Resource Strain: Medium Risk   Difficulty of Paying Living Expenses: Somewhat hard  Food Insecurity: No Food Insecurity   Worried About Charity fundraiser in the Last Year: Never true   Ran Out of Food in the Last Year: Never true  Transportation Needs: No  Transportation Needs   Lack of Transportation (Medical): No   Lack of Transportation (Non-Medical): No  Physical Activity: Insufficiently Active   Days of Exercise per Week: 3 days   Minutes of Exercise per Session: 20 min  Stress: No Stress Concern Present   Feeling of Stress : Not at all  Social Connections: Unknown   Frequency of Communication with Friends and Family: More than three times a week   Frequency of Social Gatherings with Friends and Family: More than three times a week   Attends Religious Services: Not on Electrical engineer or Organizations: Not on file   Attends Archivist Meetings: Not on file   Marital Status: Married  Human resources officer Violence: Not At Risk   Fear of Current or Ex-Partner: No   Emotionally Abused: No   Physically Abused: No   Sexually Abused: No    Outpatient Medications Prior to Visit  Medication Sig Dispense Refill   blood glucose meter kit and supplies KIT Dispense based on patient and insurance preference. Use once daily as directed. (FOR ICD-10 E11.9). 1 each 0   dapagliflozin propanediol (FARXIGA) 10 MG TABS tablet Take 1 tablet (10 mg total) by mouth daily before breakfast. 90 tablet 3   ezetimibe (ZETIA) 10 MG tablet Take 1 tablet (10 mg total) by mouth daily. 90 tablet 1   glucose blood (TRUE METRIX BLOOD GLUCOSE TEST) test strip TEST ONE TIME DAILY AS DIRECTED 100 strip 3   Multiple Vitamin (MULTIVITAMIN WITH MINERALS) TABS tablet Take 1 tablet by mouth daily.     naproxen sodium (ALEVE) 220 MG tablet Take 2 tablets by mouth daily as needed.     rosuvastatin (CRESTOR) 40 MG tablet Take 1 tablet (40 mg total) by mouth daily. 90 tablet 1   Semaglutide, 1 MG/DOSE, (OZEMPIC, 1 MG/DOSE,) 4 MG/3ML SOPN Inject 1 mg into the skin once a week. 9 mL 0   sertraline (ZOLOFT) 50 MG tablet TAKE 1 TABLET EVERY DAY 90 tablet 1   COVID-19 mRNA bivalent vaccine, Pfizer, (PFIZER COVID-19 VAC BIVALENT) injection Inject into the muscle.  0.3 mL 0   No facility-administered medications prior to visit.    Allergies  Allergen Reactions   Lipitor [Atorvastatin Calcium]     Severe muscle aches and pain.   Fentanyl Other (See Comments)    Hyperactivity   Metformin And Related Other (See Comments)    Blurry vision    Review of Systems  Constitutional:  Negative for chills and fever.  HENT:  Positive for congestion, ear pain (  right ear pain) and sore throat.   Respiratory:  Positive for cough and sputum production (clear). Negative for shortness of breath and wheezing.   Cardiovascular:  Negative for chest pain.  All other systems reviewed and are negative.     Objective:    Physical Exam Neurological:     Mental Status: He is alert and oriented to person, place, and time.  Psychiatric:        Mood and Affect: Mood normal.    Wt 225 lb (102.1 kg)    BMI 34.21 kg/m  Wt Readings from Last 3 Encounters:  05/09/21 225 lb (102.1 kg)  02/08/21 210 lb (95.3 kg)  01/23/21 210 lb (95.3 kg)        Assessment & Plan:   Problem List Items Addressed This Visit       Respiratory   Acute frontal sinusitis - Primary    Prescription given for augmentin 875/125 mg po bid for ten days. Pt to continue tylenol/ibuprofen prn sinus pain. Continue with humidifier prn and steam showers recommended as well. instructed If no symptom improvement in 48 hours please f/u       Relevant Medications   amoxicillin-clavulanate (AUGMENTIN) 875-125 MG tablet    I have discontinued Kathreen Devoid. Dibari Jr[facility]s [facility]. I am also having him start on amoxicillin-clavulanate. Additionally, I am having him maintain his multivitamin with minerals, naproxen sodium, Ozempic (1 MG/DOSE), sertraline, dapagliflozin propanediol, blood glucose meter kit and supplies, ezetimibe, rosuvastatin, and True Metrix Blood Glucose Test.  Meds ordered this encounter  Medications   amoxicillin-clavulanate (AUGMENTIN) 875-125 MG tablet    Sig:  Take 1 tablet by mouth 2 (two) times daily.    Dispense:  20 tablet    Refill:  0    Order Specific Question:   Supervising Provider    Answer:   BEDSOLE, AMY E [[id number]]     I discussed the assessment and treatment plan with the patient. The patient was provided an opportunity to ask questions and all were answered. The patient agreed with the plan and demonstrated an understanding of the instructions.   The patient was advised to call back or seek an in-person evaluation if the symptoms worsen or if the condition fails to improve as anticipated.  I provided 15 minutes of non-face-to-face time during this encounter.   Eugenia Pancoast, [place] at [place] [phone] (phone) [phone] (fax)  [place]

## 2021-05-09 NOTE — Patient Instructions (Signed)
Start prescription of augmentin 875/125 mg and take as prescribed.  Tylenol/ibuprofen ok for sinus pain as needed Increase oral fluids. Ok to continue with humidifers and hot steamy showers as discussed during visit.  It was a pleasure speaking with you today, I hope you start feeling better soon.  Regards,   Coree Brame  

## 2021-05-09 NOTE — Assessment & Plan Note (Signed)
Prescription given for augmentin 875/125 mg po bid for ten days. Pt to continue tylenol/ibuprofen prn sinus pain. Continue with humidifier prn and steam showers recommended as well. instructed If no symptom improvement in 48 hours please f/u ? ?

## 2021-05-15 ENCOUNTER — Ambulatory Visit (INDEPENDENT_AMBULATORY_CARE_PROVIDER_SITE_OTHER): Payer: Medicare HMO | Admitting: Family Medicine

## 2021-05-15 ENCOUNTER — Other Ambulatory Visit: Payer: Self-pay

## 2021-05-15 ENCOUNTER — Encounter: Payer: Self-pay | Admitting: Family Medicine

## 2021-05-15 VITALS — BP 130/80 | HR 81 | Temp 98.2°F | Ht 68.0 in | Wt 207.4 lb

## 2021-05-15 DIAGNOSIS — E7800 Pure hypercholesterolemia, unspecified: Secondary | ICD-10-CM | POA: Diagnosis not present

## 2021-05-15 DIAGNOSIS — J0110 Acute frontal sinusitis, unspecified: Secondary | ICD-10-CM | POA: Diagnosis not present

## 2021-05-15 DIAGNOSIS — Z1211 Encounter for screening for malignant neoplasm of colon: Secondary | ICD-10-CM | POA: Diagnosis not present

## 2021-05-15 DIAGNOSIS — E119 Type 2 diabetes mellitus without complications: Secondary | ICD-10-CM | POA: Diagnosis not present

## 2021-05-15 LAB — POCT GLYCOSYLATED HEMOGLOBIN (HGB A1C): Hemoglobin A1C: 7.1 % — AB (ref 4.0–5.6)

## 2021-05-15 NOTE — Progress Notes (Signed)
[name], MD Phone: [phone]  Brad Jimenez. is a 51 y.o. male who presents today for f/u.  DIABETES Disease Monitoring: Blood Sugar ranges-128-130 Polyuria/phagia/dipsia- no      Optho- UTD Medications: Compliance- taking farxiga, ozempic Hypoglycemic symptoms- no The patient has been working on limiting his sweet intake.  HYPERLIPIDEMIA Symptoms Chest pain on exertion:  no   Leg claudication:   no Medications: Compliance- taking zetia and crestor Right upper quadrant pain- no  Muscle aches- no Lipid Panel     Component Value Date/Time   CHOL 126 09/06/2020 1221   TRIG 308.0 (H) 09/06/2020 1221   HDL 42.90 09/06/2020 1221   CHOLHDL 3 09/06/2020 1221   VLDL 61.6 (H) 09/06/2020 1221   LDLCALC 65 05/08/2016 1046   LDLDIRECT 62.0 09/06/2020 1221   Sinus infection: symptoms have resolved. He still has a few days left on his antibiotic prescription.   Social History   Tobacco Use  Smoking Status Former   Packs/day: 1.50   Years: 22.00   Pack years: 33.00   Types: Cigarettes   Quit date: 02/20/2011   Years since quitting: 10.2  Smokeless Tobacco Never    Current Outpatient Medications on File Prior to Visit  Medication Sig Dispense Refill   amoxicillin-clavulanate (AUGMENTIN) 875-125 MG tablet Take 1 tablet by mouth 2 (two) times daily. 20 tablet 0   blood glucose meter kit and supplies KIT Dispense based on patient and insurance preference. Use once daily as directed. (FOR ICD-10 E11.9). 1 each 0   dapagliflozin propanediol (FARXIGA) 10 MG TABS tablet Take 1 tablet (10 mg total) by mouth daily before breakfast. 90 tablet 3   ezetimibe (ZETIA) 10 MG tablet Take 1 tablet (10 mg total) by mouth daily. 90 tablet 1   glucose blood (TRUE METRIX BLOOD GLUCOSE TEST) test strip TEST ONE TIME DAILY AS DIRECTED 100 strip 3   Multiple Vitamin (MULTIVITAMIN WITH MINERALS) TABS tablet Take 1 tablet by mouth daily.     naproxen sodium (ALEVE) 220 MG tablet Take 2 tablets  by mouth daily as needed.     rosuvastatin (CRESTOR) 40 MG tablet Take 1 tablet (40 mg total) by mouth daily. 90 tablet 1   Semaglutide, 1 MG/DOSE, (OZEMPIC, 1 MG/DOSE,) 4 MG/3ML SOPN Inject 1 mg into the skin once a week. 9 mL 0   sertraline (ZOLOFT) 50 MG tablet TAKE 1 TABLET EVERY DAY 90 tablet 1   No current facility-administered medications on file prior to visit.     ROS see history of present illness  Objective  Physical Exam Vitals:   05/15/21 1328  BP: 130/80  Pulse: 81  Temp: 98.2 F (36.8 C)  SpO2: 98%    BP Readings from Last 3 Encounters:  05/15/21 130/80  01/23/21 118/80  09/06/20 118/70   Wt Readings from Last 3 Encounters:  05/15/21 207 lb 6.4 oz (94.1 kg)  05/09/21 225 lb (102.1 kg)  02/08/21 210 lb (95.3 kg)    Physical Exam Constitutional:      General: He is not in acute distress.    Appearance: He is not diaphoretic.  Cardiovascular:     Rate and Rhythm: Normal rate and regular rhythm.     Heart sounds: Normal heart sounds.  Pulmonary:     Effort: Pulmonary effort is normal.     Breath sounds: Normal breath sounds.  Skin:    General: Skin is warm and dry.  Neurological:     Mental Status: He is alert.  Assessment/Plan: Please see individual problem list.  Problem List Items Addressed This Visit     RESOLVED: Acute frontal sinusitis    Resolved. He will complete his antibiotics.       Diabetes ([place]) - Primary    Seems to be well controlled. He will continue ozempic 1 mg weekly and farxiga 10 mg daily. Check labs as outlined.       Relevant Orders   Urine Microalbumin w/creat. ratio   POCT HgB A1C   Elevated LDL cholesterol level    LDL most recently well controlled. He will continue crestor 40 mg daily and zetia 10 mg daily.       Other Visit Diagnoses     Colon cancer screening       Relevant Orders   Ambulatory referral to Gastroenterology        Health Maintenance: Refer to GI for colon cancer  screening.  Return in about 3 months (around 08/12/2021) for Diabetes.  This visit occurred during the SARS-CoV-2 public health emergency.  Safety protocols were in place, including screening questions prior to the visit, additional usage of staff PPE, and extensive cleaning of exam room while observing appropriate contact time as indicated for disinfecting solutions.    [name], MD [place]

## 2021-05-15 NOTE — Assessment & Plan Note (Signed)
Seems to be well controlled. He will continue ozempic 1 mg weekly and farxiga 10 mg daily. Check labs as outlined.

## 2021-05-15 NOTE — Assessment & Plan Note (Signed)
LDL most recently well controlled. He will continue crestor 40 mg daily and zetia 10 mg daily.

## 2021-05-15 NOTE — Patient Instructions (Signed)
Nice to see you. We will contact you with your A1c result. Please continue to limit her sweets.

## 2021-05-15 NOTE — Assessment & Plan Note (Signed)
Resolved. He will complete his antibiotics.

## 2021-05-16 ENCOUNTER — Telehealth: Payer: Self-pay

## 2021-05-16 LAB — MICROALBUMIN / CREATININE URINE RATIO
Creatinine,U: 90.8 mg/dL
Microalb Creat Ratio: 3.2 mg/g (ref 0.0–30.0)
Microalb, Ur: 2.9 mg/dL — ABNORMAL HIGH (ref 0.0–1.9)

## 2021-05-16 NOTE — Telephone Encounter (Signed)
CALLED PATIENT NO ANSWER LEFT VOICEMAIL FOR A CALL BACK ? ?

## 2021-05-17 ENCOUNTER — Telehealth: Payer: Self-pay

## 2021-05-17 NOTE — Telephone Encounter (Signed)
.  lvm Letter sent

## 2021-05-18 ENCOUNTER — Telehealth: Payer: Self-pay | Admitting: Pharmacy Technician

## 2021-05-18 DIAGNOSIS — Z596 Low income: Secondary | ICD-10-CM

## 2021-05-18 NOTE — Progress Notes (Signed)
[facility])                                            [place] Team    05/18/2021  Brad Jimenez. 01/08/1971 [id number]  Care coordination call placed to [place] in regard to [name] application.  Spoke to [place] who informs patient is APPROVED 05/18/21-04/01/22. She informs medication will ship based on last fill date in 2022 and going forward in 2023 with delivery to the provider's office. She informs once processed delivery can take up to 42 days.  Bleu Minerd P. Austina Constantin, [place]  [phone]

## 2021-05-29 ENCOUNTER — Telehealth: Payer: Self-pay

## 2021-05-29 NOTE — Telephone Encounter (Signed)
I called and informed the patient that his Ozempic has arrived and is available for pickup and he understood.  Livan Hires,cma

## 2021-06-05 ENCOUNTER — Ambulatory Visit: Payer: Self-pay | Admitting: Pharmacist

## 2021-06-05 ENCOUNTER — Telehealth: Payer: Self-pay

## 2021-06-05 NOTE — Telephone Encounter (Signed)
Pt picked up ozempic ?

## 2021-06-05 NOTE — Chronic Care Management (AMB) (Signed)
?  Chronic Care Management  ? ?Note ? ?06/05/2021 ?Name: Brad Jimenez. MRN: [id number] DOB: 1970/11/26 ? ? ? ?Closing pharmacy CCM case at this time. Will collaborate with Care Guide to outreach to schedule follow up with RN CM. Patient has clinic contact information for future questions or concerns.  ? ?Catie Darnelle Maffucci, PharmD, [place], CPP ?Clinical Pharmacist ?Therapist, music at [employer] ?[phone] ? ?

## 2021-06-06 ENCOUNTER — Telehealth: Payer: Self-pay

## 2021-06-06 NOTE — Chronic Care Management (AMB) (Signed)
?  Chronic Care Management  ? ?Note ? ?06/06/2021 ?Name: Brad Jimenez. MRN: [id number] DOB: Aug 30, 1970 ? ?Brad Jimenez. is a 52 y.o. year old male who is a primary care patient of Caryl Bis, Angela Adam, MD. Brad Jimenez. is currently enrolled in care management services. An additional referral for RN CM  was placed.  ? ?Follow up plan: ?Unsuccessful telephone outreach attempt made. A HIPAA compliant phone message was left for the patient providing contact information and requesting a return call.  ?The care management team will reach out to the patient again over the next 7 days.  ?If patient returns call to provider office, please advise to call [place]  at [phone] ?Noreene Larsson, RMA ?Care Guide, Embedded Care Coordination ?[facility]  [facility]  ?[place], [place] [address] ?Direct Dial: [phone] ?Museum/gallery [email or web address] ?Website: [email or web address]  ? ?

## 2021-06-23 NOTE — Chronic Care Management (AMB) (Signed)
?  Chronic Care Management  ? ?Note ? ?06/23/2021 ?Name: Brad Jimenez. MRN: [id number] DOB: [birth year] ? ?Brad Jimenez. is a 51 y.o. year old male who is a primary care patient of Caryl Bis, Angela Adam, MD. Fadil Macmaster. is currently enrolled in care management services. An additional referral for RN CM  was placed.  ? ?Follow up plan: ?Patient declines further follow up and engagement by the care management team. Appropriate care team members and provider have been notified via electronic communication.  ? ?Noreene Larsson, RMA ?Care Guide, Embedded Care Coordination ?[facility]  Care Management  ?[place], [place] [address] ?Direct Dial: [phone] ?Museum/gallery conservator.[email or web address] ?Website: [email or web address]  ? ?

## 2021-06-26 ENCOUNTER — Telehealth: Payer: Medicare HMO

## 2021-06-30 ENCOUNTER — Telehealth: Payer: Self-pay

## 2021-07-05 ENCOUNTER — Telehealth: Payer: Self-pay

## 2021-07-05 NOTE — Telephone Encounter (Signed)
I called the patient and informed him that his Ozempic has arrived and is ready for pickup, he is out of town but will send someone to pick it up.  Brad Jimenez,cma  ?

## 2021-08-14 ENCOUNTER — Ambulatory Visit: Payer: Medicare HMO | Admitting: Family Medicine

## 2021-09-06 ENCOUNTER — Ambulatory Visit: Payer: Medicare HMO | Admitting: Family Medicine

## 2021-09-20 ENCOUNTER — Ambulatory Visit (INDEPENDENT_AMBULATORY_CARE_PROVIDER_SITE_OTHER): Payer: Medicare HMO | Admitting: Family Medicine

## 2021-09-20 ENCOUNTER — Encounter: Payer: Self-pay | Admitting: Family Medicine

## 2021-09-20 VITALS — BP 120/78 | HR 75 | Temp 97.7°F | Ht 68.0 in | Wt 209.8 lb

## 2021-09-20 DIAGNOSIS — F419 Anxiety disorder, unspecified: Secondary | ICD-10-CM | POA: Diagnosis not present

## 2021-09-20 DIAGNOSIS — F32A Depression, unspecified: Secondary | ICD-10-CM | POA: Diagnosis not present

## 2021-09-20 DIAGNOSIS — E119 Type 2 diabetes mellitus without complications: Secondary | ICD-10-CM

## 2021-09-20 DIAGNOSIS — E7800 Pure hypercholesterolemia, unspecified: Secondary | ICD-10-CM | POA: Diagnosis not present

## 2021-09-20 DIAGNOSIS — G8929 Other chronic pain: Secondary | ICD-10-CM

## 2021-09-20 DIAGNOSIS — M545 Low back pain, unspecified: Secondary | ICD-10-CM | POA: Diagnosis not present

## 2021-09-20 DIAGNOSIS — R69 Illness, unspecified: Secondary | ICD-10-CM | POA: Diagnosis not present

## 2021-09-20 LAB — LIPID PANEL
Cholesterol: 125 mg/dL (ref 0–200)
HDL: 43.1 mg/dL (ref >39.00)
LDL Cholesterol: 43 mg/dL (ref 0–99)
NonHDL: 81.43
Total CHOL/HDL Ratio: 3
Triglycerides: 191 mg/dL — ABNORMAL HIGH (ref 0.0–149.0)
VLDL: 38.2 mg/dL (ref 0.0–40.0)

## 2021-09-20 LAB — COMPREHENSIVE METABOLIC PANEL
ALT: 40 U/L (ref 0–53)
AST: 21 U/L (ref 0–37)
Albumin: 4.6 g/dL (ref 3.5–5.2)
Alkaline Phosphatase: 93 U/L (ref 39–117)
BUN: 18 mg/dL (ref 6–23)
CO2: 26 mEq/L (ref 19–32)
Calcium: 9.6 mg/dL (ref 8.4–10.5)
Chloride: 100 mEq/L (ref 96–112)
Creatinine, Ser: 0.78 mg/dL (ref 0.40–1.50)
GFR: 102.87 mL/min (ref >60.00)
Glucose, Bld: 161 mg/dL — ABNORMAL HIGH (ref 70–99)
Potassium: 3.8 mEq/L (ref 3.5–5.1)
Sodium: 136 mEq/L (ref 135–145)
Total Bilirubin: 0.5 mg/dL (ref 0.2–1.2)
Total Protein: 7.4 g/dL (ref 6.0–8.3)

## 2021-09-20 LAB — HEMOGLOBIN A1C: Hgb A1c MFr Bld: 7.7 % — ABNORMAL HIGH (ref 4.6–6.5)

## 2021-09-20 NOTE — Assessment & Plan Note (Signed)
Stable.  Discussed he can continue Aleve over-the-counter 1 tablet daily.  He did ask about meloxicam and I felt as though the Aleve as he is taking it is either similar risk or lower risk than the meloxicam.

## 2021-09-20 NOTE — Assessment & Plan Note (Signed)
Well-controlled.  Patient opts to continue Zoloft 50 mg daily.  Discussed we could consider coming off of this in the future if you would like.

## 2021-09-20 NOTE — Assessment & Plan Note (Signed)
Continue Crestor 40 mg daily and Zetia 10 mg daily.  Check lipid panel.

## 2021-09-20 NOTE — Patient Instructions (Signed)
Nice to see you. We will check lab work today and contact you with the results. Please reduce your candy intake.

## 2021-09-20 NOTE — Progress Notes (Signed)
Brad Rumps, MD Phone: [phone]  Brad Jimenez. is a 51 y.o. male who presents today for f/u.  DIABETES Disease Monitoring: Blood Sugar ranges-130s-140s, notes he has been eating more candy recently Polyuria/phagia/dipsia- no      Optho- UTD Medications: Compliance- taking ozempic, farxiga Hypoglycemic symptoms- no  Chronic back pain: Patient notes his back mainly bothers him at night when he sleeps.  He takes Aleve once daily.  No numbness, weakness, or radiation.  No incontinence.  Anxiety/depression: Patient notes this is well controlled.  He continues on Zoloft.   Social History   Tobacco Use  Smoking Status Former   Packs/day: 1.50   Years: 22.00   Total pack years: 33.00   Types: Cigarettes   Quit date: 02/20/2011   Years since quitting: 10.5  Smokeless Tobacco Never    Current Outpatient Medications on File Prior to Visit  Medication Sig Dispense Refill   blood glucose meter kit and supplies KIT Dispense based on patient and insurance preference. Use once daily as directed. (FOR ICD-10 E11.9). 1 each 0   dapagliflozin propanediol (FARXIGA) 10 MG TABS tablet Take 1 tablet (10 mg total) by mouth daily before breakfast. 90 tablet 3   ezetimibe (ZETIA) 10 MG tablet Take 1 tablet (10 mg total) by mouth daily. 90 tablet 1   glucose blood (TRUE METRIX BLOOD GLUCOSE TEST) test strip TEST ONE TIME DAILY AS DIRECTED 100 strip 3   Multiple Vitamin (MULTIVITAMIN WITH MINERALS) TABS tablet Take 1 tablet by mouth daily.     naproxen sodium (ALEVE) 220 MG tablet Take 2 tablets by mouth daily as needed.     rosuvastatin (CRESTOR) 40 MG tablet Take 1 tablet (40 mg total) by mouth daily. 90 tablet 1   Semaglutide, 1 MG/DOSE, (OZEMPIC, 1 MG/DOSE,) 4 MG/3ML SOPN Inject 1 mg into the skin once a week. 9 mL 0   sertraline (ZOLOFT) 50 MG tablet TAKE 1 TABLET EVERY DAY 90 tablet 1   No current facility-administered medications on file prior to visit.     ROS see history of  present illness  Objective  Physical Exam Vitals:   09/20/21 1205  BP: 120/78  Pulse: 75  Temp: 97.7 F (36.5 C)  SpO2: 97%    BP Readings from Last 3 Encounters:  09/20/21 120/78  05/15/21 130/80  01/23/21 118/80   Wt Readings from Last 3 Encounters:  09/20/21 209 lb 12.8 oz (95.2 kg)  05/15/21 207 lb 6.4 oz (94.1 kg)  05/09/21 225 lb (102.1 kg)    Physical Exam Constitutional:      General: He is not in acute distress.    Appearance: He is not diaphoretic.  Cardiovascular:     Rate and Rhythm: Normal rate and regular rhythm.     Heart sounds: Normal heart sounds.  Pulmonary:     Effort: Pulmonary effort is normal.     Breath sounds: Normal breath sounds.  Skin:    General: Skin is warm and dry.  Neurological:     Mental Status: He is alert.      Assessment/Plan: Please see individual problem list.  Problem List Items Addressed This Visit     Anxiety and depression (Chronic)    Well-controlled.  Patient opts to continue Zoloft 50 mg daily.  Discussed we could consider coming off of this in the future if you would like.      Chronic low back pain (Chronic)    Stable.  Discussed he can continue Aleve over-the-counter  1 tablet daily.  He did ask about meloxicam and I felt as though the Aleve as he is taking it is either similar risk or lower risk than the meloxicam.      Diabetes ([place]) - Primary (Chronic)    Check A1c.  Continue Ozempic 1 mg weekly and Farxiga 10 mg daily.      Relevant Orders   Comp Met (CMET)   Lipid panel   HgB A1c   Elevated LDL cholesterol level (Chronic)    Continue Crestor 40 mg daily and Zetia 10 mg daily.  Check lipid panel.      Relevant Orders   Comp Met (CMET)   Lipid panel     Health Maintenance: Patient will call to schedule his colonoscopy.  Return in about 3 months (around 12/21/2021) for Diabetes.  The patient did ask about a friend of his liver enzyme results.  I discussed that I do not know that the other  person's medical history and cannot provide any medical advice on this.  We did discuss what the mall ranges are for liver enzymes.  I advised that his friend would need to follow-up with his physician regarding this.  Brad Rumps, MD [place]

## 2021-09-20 NOTE — Assessment & Plan Note (Signed)
Check A1c.  Continue Ozempic 1 mg weekly and Farxiga 10 mg daily.

## 2021-09-21 DIAGNOSIS — E785 Hyperlipidemia, unspecified: Secondary | ICD-10-CM | POA: Diagnosis not present

## 2021-09-21 DIAGNOSIS — E119 Type 2 diabetes mellitus without complications: Secondary | ICD-10-CM | POA: Diagnosis not present

## 2021-09-21 DIAGNOSIS — Z7984 Long term (current) use of oral hypoglycemic drugs: Secondary | ICD-10-CM | POA: Diagnosis not present

## 2021-09-21 DIAGNOSIS — R030 Elevated blood-pressure reading, without diagnosis of hypertension: Secondary | ICD-10-CM | POA: Diagnosis not present

## 2021-09-21 DIAGNOSIS — I951 Orthostatic hypotension: Secondary | ICD-10-CM | POA: Diagnosis not present

## 2021-09-21 DIAGNOSIS — R69 Illness, unspecified: Secondary | ICD-10-CM | POA: Diagnosis not present

## 2021-09-21 DIAGNOSIS — Z7985 Long-term (current) use of injectable non-insulin antidiabetic drugs: Secondary | ICD-10-CM | POA: Diagnosis not present

## 2021-09-21 DIAGNOSIS — M1990 Unspecified osteoarthritis, unspecified site: Secondary | ICD-10-CM | POA: Diagnosis not present

## 2021-09-21 DIAGNOSIS — Z6833 Body mass index (BMI) 33.0-33.9, adult: Secondary | ICD-10-CM | POA: Diagnosis not present

## 2021-09-21 DIAGNOSIS — E669 Obesity, unspecified: Secondary | ICD-10-CM | POA: Diagnosis not present

## 2021-09-21 DIAGNOSIS — Z791 Long term (current) use of non-steroidal anti-inflammatories (NSAID): Secondary | ICD-10-CM | POA: Diagnosis not present

## 2021-09-21 DIAGNOSIS — Z809 Family history of malignant neoplasm, unspecified: Secondary | ICD-10-CM | POA: Diagnosis not present

## 2021-10-09 DIAGNOSIS — L089 Local infection of the skin and subcutaneous tissue, unspecified: Secondary | ICD-10-CM | POA: Diagnosis not present

## 2021-10-11 ENCOUNTER — Telehealth: Payer: Self-pay

## 2021-10-11 NOTE — Telephone Encounter (Signed)
I called and spoke with the patient and informed him that his Ozempic has arrived, he stated he was out of town but will pick up next week when he gets back.  Viren Lebeau,cma

## 2021-10-18 ENCOUNTER — Other Ambulatory Visit: Payer: Self-pay | Admitting: Family Medicine

## 2021-10-18 ENCOUNTER — Telehealth: Payer: Self-pay

## 2021-10-18 DIAGNOSIS — E785 Hyperlipidemia, unspecified: Secondary | ICD-10-CM

## 2021-10-18 NOTE — Telephone Encounter (Signed)
Patient picked up his Ozempic from patient assistance on 10/18/21 @ 1:40pm.

## 2021-12-26 ENCOUNTER — Ambulatory Visit: Payer: Medicare HMO | Admitting: Family Medicine

## 2022-01-01 ENCOUNTER — Telehealth: Payer: Self-pay

## 2022-01-01 DIAGNOSIS — E785 Hyperlipidemia, unspecified: Secondary | ICD-10-CM

## 2022-01-01 MED ORDER — ROSUVASTATIN CALCIUM 40 MG PO TABS: 40.0000 mg | ORAL_TABLET | Freq: Every day | ORAL | 1 refills | Status: DC

## 2022-01-01 MED ORDER — SERTRALINE HCL 50 MG PO TABS: 50.0000 mg | ORAL_TABLET | Freq: Every day | ORAL | 1 refills | Status: DC

## 2022-01-01 MED ORDER — TRUE METRIX BLOOD GLUCOSE TEST VI STRP: ORAL_STRIP | 3 refills | Status: DC

## 2022-01-01 MED ORDER — EZETIMIBE 10 MG PO TABS: 10.0000 mg | ORAL_TABLET | Freq: Every day | ORAL | 1 refills | Status: DC

## 2022-01-01 NOTE — Telephone Encounter (Signed)
Patient states he had to change his appointment with Dr. [name] and his medications will run out before his appointment on 02/09/2022.  Patient states he will need refills before his appointmen for his sertraline (ZOLOFT) 50 MG tablet, glucose blood (TRUE METRIX BLOOD GLUCOSE TEST) test strip, ezetimibe (ZETIA) 10 MG tablet, and rosuvastatin (CRESTOR) 40 MG tablet.  *Patient states he would like to have these refilled through [facility].

## 2022-01-01 NOTE — Telephone Encounter (Signed)
Sent to pharmacy 

## 2022-01-09 ENCOUNTER — Telehealth: Payer: Self-pay | Admitting: Family Medicine

## 2022-01-09 NOTE — Telephone Encounter (Signed)
Left detailed message per DPR.  Ozempic from patient assistance program is ready to be picked up.

## 2022-01-22 ENCOUNTER — Telehealth: Payer: Self-pay | Admitting: Pharmacist

## 2022-01-22 NOTE — Progress Notes (Signed)
[facility])                                            [place] Team    01/22/2022  Brad Jimenez. 09-14-1970 [id number]  First attempt to reach patient regarding 2024 Re-enrollment PAP for Farxiga and Ozempic.  Left VM, will try to call again at a later date.  Curlene Labrum, PharmD [place] Pharmacist Office: [phone]

## 2022-01-22 NOTE — Progress Notes (Signed)
[facility])                                            [place] Team    01/22/2022  Casy Brunetto. 12-08-1970 [id number]  Confirmed with patient he is interested in 2024 PAP re-enrollment for Farxiga/Ozempic.  Curlene Labrum, PharmD [place] Pharmacist Office: [phone]

## 2022-01-29 ENCOUNTER — Telehealth: Payer: Self-pay | Admitting: Pharmacy Technician

## 2022-01-29 DIAGNOSIS — Z596 Low income: Secondary | ICD-10-CM

## 2022-01-29 NOTE — Progress Notes (Addendum)
[facility])                                            [place] Team    01/29/2022  Brad Jimenez 1971-02-10 [id number]                                      Medication Assistance Referral-FOR 2024 RE ENROLLMENT  Referral From:  [facility] RPh  Curlene Labrum  Medication/Company: [name] Patient application portion:  Mailed Provider application portion: Faxed  to Dr. [name] Provider address/fax verified via: Office website  Medication/Company: [name] Patient application portion:  Mailed Provider application portion: Faxed  to Dr. [name] Provider address/fax verified via: Office website  Jeron Grahn P. [name], [place]  [phone]

## 2022-02-01 ENCOUNTER — Telehealth (INDEPENDENT_AMBULATORY_CARE_PROVIDER_SITE_OTHER): Payer: Medicare HMO | Admitting: Family

## 2022-02-01 DIAGNOSIS — U071 COVID-19: Secondary | ICD-10-CM | POA: Diagnosis not present

## 2022-02-01 MED ORDER — NIRMATRELVIR/RITONAVIR (PAXLOVID)TABLET: 3.0000 | ORAL_TABLET | Freq: Two times a day (BID) | ORAL | 0 refills | Status: AC

## 2022-02-01 NOTE — Progress Notes (Signed)
Virtual Visit via Video Note  I connected with@  on 02/02/22 at 11:15 AM EDT by a video enabled telemedicine application and verified that I am speaking with the correct person using two identifiers.  Location patient: home Location provider:work  Persons participating in the virtual visit: patient, provider  I discussed the limitations of evaluation and management by telemedicine and the availability of in person appointments. The patient expressed understanding and agreed to proceed.   HPI: Tested positive for covid today, accompanied by wife on video  He had fever, chills , x 3 days, resolved .  Now he is having 'light' cough, sinus pressure which has improved.  Overall feeling better today  HA started 5 days ago, improved.   He has  been using tylenol,  robitussin cough and flu.   No cp, sob, wheezing  H/o DM  No h/o CKD  Former smoker  ROS: See pertinent positives and negatives per HPI.    EXAM:  VITALS per patient if applicable: There were no vitals taken for this visit. BP Readings from Last 3 Encounters:  09/20/21 120/78  05/15/21 130/80  01/23/21 118/80   Wt Readings from Last 3 Encounters:  09/20/21 209 lb 12.8 oz (95.2 kg)  05/15/21 207 lb 6.4 oz (94.1 kg)  05/09/21 225 lb (102.1 kg)    GENERAL: alert, oriented, appears well and in no acute distress  HEENT: atraumatic, conjunttiva clear, no obvious abnormalities on inspection of external nose and ears  NECK: normal movements of the head and neck  LUNGS: on inspection no signs of respiratory distress, breathing rate appears normal, no obvious gross SOB, gasping or wheezing  CV: no obvious cyanosis  MS: moves all visible extremities without noticeable abnormality  PSYCH/NEURO: pleasant and cooperative, no obvious depression or anxiety, speech and thought processing grossly intact  ASSESSMENT AND PLAN:  Discussed the following assessment and plan:  Problem List Items Addressed This Visit        Other   COVID-19 - Primary    No acute respiratory distress.  Fortunately symptoms have improved.  Symptoms started 5 days ago and discussed that he is on line in regards to starting antiviral therapy.  Due to history of smoking ,diabetes and risk of severe disease, we jointly agreed starting Paxlovid is most appropriate.  Counseled him on side effects and advised him to monitor for myalgias as on statin or to hold statin altogether.   Counseled on lacking long term safely and effectiveness data of medication, Paxlovid. Explained EUA for Paxlovid.   GFR > 60.  Counseled on adverse effects including altered taste, diarrhea, HTN, and myalgia.   Patient is most comfortable and desires to start Paxlovid and understands to call me with concerns or new symptoms.  He will let me know how he is doing.       Relevant Medications   nirmatrelvir/ritonavir EUA (PAXLOVID) 20 x 150 MG & 10 x '100MG'$  TABS    -we discussed possible serious and likely etiologies, options for evaluation and workup, limitations of telemedicine visit vs in person visit, treatment, treatment risks and precautions. Pt prefers to treat via telemedicine empirically rather then risking or undertaking an in person visit at this moment.  .   I discussed the assessment and treatment plan with the patient. The patient was provided an opportunity to ask questions and all were answered. The patient agreed with the plan and demonstrated an understanding of the instructions.   The patient was advised to call back or  seek an in-person evaluation if the symptoms worsen or if the condition fails to improve as anticipated.   Mable Paris, FNP

## 2022-02-02 ENCOUNTER — Encounter: Payer: Self-pay | Admitting: Family

## 2022-02-02 NOTE — Assessment & Plan Note (Signed)
No acute respiratory distress.  Fortunately symptoms have improved.  Symptoms started 5 days ago and discussed that he is on line in regards to starting antiviral therapy.  Due to history of smoking ,diabetes and risk of severe disease, we jointly agreed starting Paxlovid is most appropriate.  Counseled him on side effects and advised him to monitor for myalgias as on statin or to hold statin altogether.   Counseled on lacking long term safely and effectiveness data of medication, Paxlovid. Explained EUA for Paxlovid.   GFR > 60.  Counseled on adverse effects including altered taste, diarrhea, HTN, and myalgia.   Patient is most comfortable and desires to start Paxlovid and understands to call me with concerns or new symptoms.  He will let me know how he is doing.

## 2022-02-02 NOTE — Patient Instructions (Signed)
Please stay in quarantine per cdc guidelines.   If you test positive for COVID-19, stay home for at least 5 days and isolate from others in your home. You are likely most infectious during these first 5 days. Wear a high-quality mask if you must be around others at home and in public. Do not go places where you are unable to wear a mask.  We discussed starting Paxlovid which is an unapproved drug that is authorized for use under an Emergency Use Authorization.  There are no adequate, approved, available products for the treatment of COVID-19 in adults who have mild-to-moderate COVID-19 and are at high risk for progressing to severe COVID-19, including hospitalization or death.  There are benefits and risks of taking this treatment as outlined in the "Fact Sheet for Patients and Caregivers." You may find this document here and please read in detail   https://www.fda.gov/media/155051/download   I have sent Paxlovid to your pharmacy. Please call pharmacy so they bring medication out to your car and you do not have to go inside.   PAXLOVID ADMINISTRATION INSTRUCTIONS:  Take with or without food. Swallow the tablets whole. Don't chew, crush, or break the medications because it might not work as well  For each dose of the medication, you should be taking 3 tablets together (2 pink oval and 1 white oval) TWICE a day for FIVE days   Finish your full five-day course of Paxlovid even if you feel better before you're done. Stopping this medication too early can make it less effective to prevent severe illness related to COVID19.    Paxlovid is prescribed for YOU ONLY. Don't share it with others, even if they have similar symptoms as you. This medication might not be right for everyone.  Make sure to take steps to protect yourself and others while you're taking this medication in order to get well soon and to prevent others from getting sick with COVID-19.  Paxlovid (nirmatrelvir / ritonavir) can cause  hormonal birth control medications to not work well. If you or your partner is currently taking hormonal birth control, use condoms or other birth control methods to prevent unintended pregnancies.   COMMON SIDE EFFECTS: Altered or bad taste in your mouth  Diarrhea  High blood pressure (1% of people) Muscle aches (1% of people)    If your COVID-19 symptoms get worse, get medical help right away. Call 911 if you experience symptoms such as worsening cough, trouble breathing, chest pain that doesn't go away, confusion, a hard time staying awake, and pale or blue-colored skin.This medication won't prevent all COVID-19 cases from getting worse.   

## 2022-02-07 ENCOUNTER — Other Ambulatory Visit: Payer: Self-pay

## 2022-02-07 DIAGNOSIS — E119 Type 2 diabetes mellitus without complications: Secondary | ICD-10-CM

## 2022-02-07 MED ORDER — DAPAGLIFLOZIN PROPANEDIOL 10 MG PO TABS: 10.0000 mg | ORAL_TABLET | Freq: Every day | ORAL | 3 refills | Status: DC

## 2022-02-07 MED ORDER — OZEMPIC (1 MG/DOSE) 4 MG/3ML ~~LOC~~ SOPN: 1.0000 mg | PEN_INJECTOR | SUBCUTANEOUS | 3 refills | Status: DC

## 2022-02-08 ENCOUNTER — Telehealth: Payer: Self-pay | Admitting: Family Medicine

## 2022-02-08 NOTE — Telephone Encounter (Signed)
Copied from [place] [id number]. Topic: Medicare AWV >> Feb 08, 2022  9:48 AM Devoria Glassing wrote: Reason for CRM: Left message for patient to schedule Annual Wellness Visit.  Please schedule with Nurse Health Advisor Denisa O'Brien-Blaney, LPN at [facility]. This appt can be telephone or office visit.  Please call [phone] ask for [facility]

## 2022-02-09 ENCOUNTER — Ambulatory Visit: Payer: Medicare HMO | Admitting: Family Medicine

## 2022-02-15 ENCOUNTER — Ambulatory Visit (INDEPENDENT_AMBULATORY_CARE_PROVIDER_SITE_OTHER): Payer: Medicare HMO | Admitting: Family Medicine

## 2022-02-15 ENCOUNTER — Encounter: Payer: Self-pay | Admitting: Family Medicine

## 2022-02-15 VITALS — BP 120/78 | HR 85 | Temp 98.6°F | Ht 68.0 in | Wt 199.6 lb

## 2022-02-15 DIAGNOSIS — R0681 Apnea, not elsewhere classified: Secondary | ICD-10-CM | POA: Insufficient documentation

## 2022-02-15 DIAGNOSIS — E119 Type 2 diabetes mellitus without complications: Secondary | ICD-10-CM

## 2022-02-15 DIAGNOSIS — H9193 Unspecified hearing loss, bilateral: Secondary | ICD-10-CM | POA: Diagnosis not present

## 2022-02-15 DIAGNOSIS — Z87891 Personal history of nicotine dependence: Secondary | ICD-10-CM | POA: Diagnosis not present

## 2022-02-15 DIAGNOSIS — E7800 Pure hypercholesterolemia, unspecified: Secondary | ICD-10-CM

## 2022-02-15 LAB — POCT GLYCOSYLATED HEMOGLOBIN (HGB A1C): Hemoglobin A1C: 9.8 % — AB (ref 4.0–5.6)

## 2022-02-15 NOTE — Assessment & Plan Note (Addendum)
Check A1c.  Increase Ozempic to 2 mg weekly and continue Farxiga 10 mg daily.  Patient was encouraged to start checking his sugars on a daily basis fasting and 2 hours after a meal.  He will follow-up in 6 weeks for his diabetes.  He was counseled to cut out sugary foods and drinks.

## 2022-02-15 NOTE — Progress Notes (Signed)
Brad Rumps, MD Phone: [phone]  Eugine Bubb. is a 51 y.o. male who presents today for f/u.  DIABETES Disease Monitoring: Blood Sugar ranges-not checking Polyuria/phagia/dipsia- no      Optho- UTD Medications: Compliance- taking ozempic, farxiga Hypoglycemic symptoms- no  HYPERLIPIDEMIA Symptoms Chest pain on exertion:  no   Leg claudication:   no Medications: Compliance- taking crestor, zetia Right upper quadrant pain- no  Muscle aches- no Lipid Panel     Component Value Date/Time   CHOL 125 09/20/2021 1221   TRIG 191.0 (H) 09/20/2021 1221   HDL 43.10 09/20/2021 1221   CHOLHDL 3 09/20/2021 1221   VLDL 38.2 09/20/2021 1221   LDLCALC 43 09/20/2021 1221   LDLDIRECT 62.0 09/06/2020 1221   Former smoker: Patient has never had lung cancer screening.  Apneic episodes.  Patient reports his wife has seen him stop breathing at night.  He also gasps for air.  Hearing difficulty: This has been an issue recently.  If there is any distractible noise is hard for him to hear somebody talking to him.  He said lots of noise exposure in his lifetime.  Epworth Sleepiness Scale score of 2 with individual scores of 0, 1, 0, 0, 0, 0, 1, 0  Social History   Tobacco Use  Smoking Status Former   Packs/day: 1.50   Years: 22.00   Total pack years: 33.00   Types: Cigarettes   Quit date: 02/20/2011   Years since quitting: 10.9  Smokeless Tobacco Never    Current Outpatient Medications on File Prior to Visit  Medication Sig Dispense Refill   blood glucose meter kit and supplies KIT Dispense based on patient and insurance preference. Use once daily as directed. (FOR ICD-10 E11.9). 1 each 0   dapagliflozin propanediol (FARXIGA) 10 MG TABS tablet Take 1 tablet (10 mg total) by mouth daily before breakfast. 90 tablet 3   ezetimibe (ZETIA) 10 MG tablet Take 1 tablet (10 mg total) by mouth daily. 90 tablet 1   glucose blood (TRUE METRIX BLOOD GLUCOSE TEST) test strip TEST ONE TIME  DAILY AS DIRECTED 100 strip 3   Lancets (ONETOUCH DELICA PLUS AVWPVX48A) MISC SMARTSIG:1 Topical Daily     Multiple Vitamin (MULTIVITAMIN WITH MINERALS) TABS tablet Take 1 tablet by mouth daily.     naproxen sodium (ALEVE) 220 MG tablet Take 2 tablets by mouth daily as needed.     rosuvastatin (CRESTOR) 40 MG tablet Take 1 tablet (40 mg total) by mouth daily. 90 tablet 1   sertraline (ZOLOFT) 50 MG tablet Take 1 tablet (50 mg total) by mouth daily. 90 tablet 1   No current facility-administered medications on file prior to visit.     ROS see history of present illness  Objective  Physical Exam Vitals:   02/15/22 1424  BP: 120/78  Pulse: 85  Temp: 98.6 F (37 C)  SpO2: 97%    BP Readings from Last 3 Encounters:  02/15/22 120/78  09/20/21 120/78  05/15/21 130/80   Wt Readings from Last 3 Encounters:  02/15/22 199 lb 9.6 oz (90.5 kg)  09/20/21 209 lb 12.8 oz (95.2 kg)  05/15/21 207 lb 6.4 oz (94.1 kg)    Physical Exam Constitutional:      General: He is not in acute distress.    Appearance: He is not diaphoretic.  Cardiovascular:     Rate and Rhythm: Normal rate and regular rhythm.     Heart sounds: Normal heart sounds.  Pulmonary:  Effort: Pulmonary effort is normal.     Breath sounds: Normal breath sounds.  Skin:    General: Skin is warm and dry.  Neurological:     Mental Status: He is alert.    Diabetic Foot Exam - Simple   Simple Foot Form Diabetic Foot exam was performed with the following findings: Yes 02/15/2022  2:33 PM  Visual Inspection No deformities, no ulcerations, no other skin breakdown bilaterally: Yes Sensation Testing Intact to touch and monofilament testing bilaterally: Yes Pulse Check Posterior Tibialis and Dorsalis pulse intact bilaterally: Yes Comments      Assessment/Plan: Please see individual problem list.  Problem List Items Addressed This Visit     Diabetes ([place]) - Primary (Chronic)    Check A1c.  Increase Ozempic to 2  mg weekly and continue Farxiga 10 mg daily.  Patient was encouraged to start checking his sugars on a daily basis fasting and 2 hours after a meal.  He will follow-up in 6 weeks for his diabetes.  He was counseled to cut out sugary foods and drinks.      Relevant Orders   POCT HgB A1C (Completed)   Elevated LDL cholesterol level (Chronic)    Continue Crestor 40 mg daily and Zetia 10 mg daily.      Apneic episode    Home sleep study ordered.      Relevant Orders   Home sleep test   Former smoker    Referral for lung cancer screening.      Relevant Orders   Ambulatory Referral for Lung Cancer Scre   Hearing difficulty of both ears    Refer for audiology evaluation.      Relevant Orders   Ambulatory referral to Audiology     Health Maintenance: Patient will call to schedule his colonoscopy.  He was given the phone number.  Return in about 6 weeks (around 03/29/2022) for Diabetes.   Brad Rumps, MD [place]

## 2022-02-15 NOTE — Assessment & Plan Note (Signed)
Continue Crestor 40 mg daily and Zetia 10 mg daily.

## 2022-02-15 NOTE — Patient Instructions (Signed)
Nice to see you. We can increase your Ozempic to 2 mg weekly.  I will check with our pharmacist on how we get this approved for you. Please start checking your sugars twice daily.  This should be fasting in the morning before you eat and 2 hours after a meal later in the day. I will see you back in 6 weeks. If you do not hear from [place] or the pharmacist on the [place] in the next week or so please let us know.

## 2022-02-15 NOTE — Assessment & Plan Note (Signed)
Home sleep study ordered

## 2022-02-15 NOTE — Assessment & Plan Note (Signed)
Referral for lung cancer screening.

## 2022-02-15 NOTE — Assessment & Plan Note (Signed)
Refer for audiology evaluation. 

## 2022-02-19 ENCOUNTER — Ambulatory Visit (INDEPENDENT_AMBULATORY_CARE_PROVIDER_SITE_OTHER): Payer: Medicare HMO

## 2022-02-19 VITALS — Ht 68.0 in | Wt 199.0 lb

## 2022-02-19 DIAGNOSIS — Z0000 Encounter for general adult medical examination without abnormal findings: Secondary | ICD-10-CM

## 2022-02-19 NOTE — Progress Notes (Signed)
Subjective:   Brad Jimenez. is a 51 y.o. male who presents for Medicare Annual/Subsequent preventive examination.  Review of Systems    No ROS.  Medicare Wellness Virtual Visit.  Visual/audio telehealth visit, UTA vital signs.   See social history for additional risk factors.   Cardiac Risk Factors include: advanced age (>47mn, >>70women);male gender     Objective:    Today's Vitals   02/19/22 1328  Weight: 199 lb (90.3 kg)  Height: _0  (1.727 m)   Body mass index is 30.26 kg/m.     02/19/2022    1:27 PM 02/08/2021    3:38 PM 09/23/2019   11:38 AM 09/22/2018    1:16 PM 09/17/2017    2:20 PM 09/14/2016    2:31 PM 04/07/2015    1:42 PM  Advanced Directives  Does Patient Have a Medical Advance Directive? _1  No No  Would patient like information on creating a medical advance directive? No - Patient declined No - Patient declined No - Patient declined No - Patient declined Yes (MAU/Ambulatory/Procedural Areas - Information given) Yes (MAU/Ambulatory/Procedural Areas - Information given) No - patient declined information    Current Medications (verified) Outpatient Encounter Medications as of 02/19/2022  Medication Sig   blood glucose meter kit and supplies KIT Dispense based on patient and insurance preference. Use once daily as directed. (FOR ICD-10 E11.9).   dapagliflozin propanediol (FARXIGA) 10 MG TABS tablet Take 1 tablet (10 mg total) by mouth daily before breakfast.   ezetimibe (ZETIA) 10 MG tablet Take 1 tablet (10 mg total) by mouth daily.   glucose blood (TRUE METRIX BLOOD GLUCOSE TEST) test strip TEST ONE TIME DAILY AS DIRECTED   Lancets (ONETOUCH DELICA PLUS [id number] MISC SMARTSIG:1 Topical Daily   Multiple Vitamin (MULTIVITAMIN WITH MINERALS) TABS tablet Take 1 tablet by mouth daily.   naproxen sodium (ALEVE) 220 MG tablet Take 2 tablets by mouth daily as needed.   rosuvastatin (CRESTOR) 40 MG tablet Take 1 tablet (40 mg total) by mouth daily.    sertraline (ZOLOFT) 50 MG tablet Take 1 tablet (50 mg total) by mouth daily.   No facility-administered encounter medications on file as of 02/19/2022.    Allergies (verified) Lipitor [atorvastatin calcium], Fentanyl, and Metformin and related   History: Past Medical History:  Diagnosis Date   Anxiety    Arthritis    Asthma    Chickenpox    COPD (chronic obstructive pulmonary disease) ([place]    Depression    Diabetes ([facility]    History of blood transfusion    Low back pain    Past Surgical History:  Procedure Laterality Date   APPENDECTOMY  2002   BACK SURGERY  2001, 2002   CHOLECYSTECTOMY  2012   Family History  Problem Relation Age of Onset   Arthritis Mother    Lung cancer Mother    Heart disease Mother    Hypertension Mother    Diabetes Mother    Cancer Mother        Lung   Diabetes Sister    Tongue cancer Sister    Alcoholism Father    Stroke Father    Social History   Socioeconomic History   Marital status: Married    Spouse name: Not on file   Number of children: Not on file   Years of education: Not on file   Highest education level: Not on file  Occupational History   Not on file  Tobacco Use   Smoking status: Former    Packs/day: 1.50    Years: 22.00    Total pack years: 33.00    Types: Cigarettes    Quit date: 02/20/2011    Years since quitting: 11.0   Smokeless tobacco: Never  Vaping Use   Vaping Use: Never used  Substance and Sexual Activity   Alcohol use: Yes    Alcohol/week: 2.0 standard drinks of alcohol    Types: 2 Cans of beer per week    Comment: occasional   Drug use: No   Sexual activity: Yes  Other Topics Concern   Not on file  Social History Narrative   Not on file   Social Determinants of Health   Financial Resource Strain: Low Risk  (02/19/2022)   Overall Financial Resource Strain (CARDIA)    Difficulty of Paying Living Expenses: Not hard at all  Food Insecurity: No Food Insecurity (02/19/2022)   Hunger Vital Sign     Worried About Running Out of Food in the Last Year: Never true    Ran Out of Food in the Last Year: Never true  Transportation Needs: No Transportation Needs (02/19/2022)   PRAPARE - Hydrologist (Medical): No    Lack of Transportation (Non-Medical): No  Physical Activity: Insufficiently Active (02/19/2022)   Exercise Vital Sign    Days of Exercise per Week: 3 days    Minutes of Exercise per Session: 20 min  Stress: No Stress Concern Present (02/19/2022)   [place]    Feeling of Stress : Not at all  Social Connections: Unknown (02/19/2022)   Social Connection and Isolation Panel [NHANES]    Frequency of Communication with Friends and Family: More than three times a week    Frequency of Social Gatherings with Friends and Family: More than three times a week    Attends Religious Services: Not on Advertising copywriter or Organizations: Not on file    Attends Archivist Meetings: Not on file    Marital Status: Married    Tobacco Counseling Counseling given: Not Answered   Clinical Intake:  Pre-visit preparation completed: Yes        Diabetes: Yes (Followed by PCP)  How often do you need to have someone help you when you read instructions, pamphlets, or other written materials from your doctor or pharmacy?: 1 - Never  Nutrition Risk Assessment: Does the patient have any non-healing wounds?  No  Has the patient had any unintentional weight loss or weight gain?  No   Diabetes: Is the patient diabetic?  Yes  If diabetic, was a CBG obtained today?  Yes , FBS 200 Did the patient bring in their glucometer from home?  No  How often do you monitor your CBG's? daily. Plans to start checking twice daily.   Financial Strains and Diabetes Management: Are you having any financial strains with the device, your supplies or your medication? No .  Does the patient want  to be seen by Chronic Care Management for management of their diabetes?  No  Would the patient like to be referred to a Nutritionist or for Diabetic Management?  No    Interpreter Needed?: No      Activities of Daily Living    02/19/2022    1:23 PM  In your present state of health, do you have any difficulty performing the following activities:  Hearing? 0  Vision? 0  Difficulty concentrating or making decisions? 0  Walking or climbing stairs? 0  Dressing or bathing? 0  Doing errands, shopping? 0  Preparing Food and eating ? N  Using the Toilet? N  In the past six months, have you accidently leaked urine? N  Do you have problems with loss of bowel control? N  Managing your Medications? N  Managing your Finances? N  Housekeeping or managing your Housekeeping? N    Patient Care Team: Leone Haven, MD as PCP - General (Family Medicine)  Indicate any recent Medical Services you may have received from other than [facility] providers in the past year (date may be approximate).     Assessment:   This is a routine wellness examination for Maximo.  I connected with  [name] on 02/19/22 by a audio enabled telemedicine application and verified that I am speaking with the correct person using two identifiers.  Patient Location: Home  Provider Location: Office/Clinic  I discussed the limitations of evaluation and management by telemedicine. The patient expressed understanding and agreed to proceed.   Hearing/Vision screen Hearing Screening - Comments:: Patient has difficulty hearing conversational tones. Audiology testing scheduled 02/20/22. [place].  Vision Screening - Comments:: Followed by [facility]  Reading glasses only Diabetic eye exam; no retinopathy reported They have regular follow up with the ophthalmologist  Dietary issues and exercise activities discussed: Current Exercise Habits: Home exercise routine, Type of exercise: walking, Intensity:  Mild He tries to have a healthy diet and good water intake.   Goals Addressed             This Visit's Progress    Portion control meals       Monitor sugar intake Stay hydrated       Depression Screen    02/19/2022    1:27 PM 02/19/2022    1:23 PM 02/01/2022   11:22 AM 09/20/2021   12:06 PM 05/15/2021    1:29 PM 02/08/2021    3:35 PM 01/23/2021    1:30 PM  PHQ 2/9 Scores  PHQ - 2 Score 0 0 0 0 0 0 0    Fall Risk    02/19/2022    1:23 PM 02/15/2022    2:28 PM 09/20/2021   12:06 PM 05/15/2021    1:29 PM 02/08/2021    3:39 PM  [place] in the past year? 0 0 0 0 0  Number falls in past yr: 0 0 0 0 0  Injury with Fall? 0 0 0 0   Risk for fall due to : No Fall Risks No Fall Risks No Fall Risks No Fall Risks   Follow up Falls evaluation completed;Falls prevention discussed Falls evaluation completed Falls evaluation completed Falls evaluation completed Falls evaluation completed    FALL RISK PREVENTION PERTAINING TO THE HOME: Home free of loose throw rugs in walkways, pet beds, electrical cords, etc? Yes  Adequate lighting in your home to reduce risk of falls? Yes   ASSISTIVE DEVICES UTILIZED TO PREVENT FALLS: Life alert? No  Use of a cane, walker or w/c? No   TIMED UP AND GO: Was the test performed? No .   Cognitive Function:    09/17/2017    2:27 PM 09/14/2016    2:47 PM  MMSE - Mini Mental State Exam  Orientation to time 5 5  Orientation to Place 5 5  Registration 3 3  Attention/ Calculation 5 5  Recall 3 3  Language- name 2 objects 2 2  Language- repeat 1 1  Language- follow 3 step command 3 3  Language- read & follow direction 1 1  Write a sentence 1 1  Copy design 1 1  Total score 30 30        02/19/2022    1:24 PM 02/08/2021    3:48 PM 09/23/2019   11:41 AM 09/22/2018    1:21 PM  6CIT Screen  What Year? 0 points 0 points 0 points 0 points  What month? 0 points 0 points 0 points 0 points  What time? 0 points 0 points  0 points  Count  back from 20 0 points   0 points  Months in reverse 0 points 0 points 0 points 0 points  Repeat phrase 0 points  0 points 0 points  Total Score 0 points   0 points    Immunizations Immunization History  Administered Date(s) Administered   PFIZER(Purple Top)SARS-COV-2 Vaccination 06/29/2019, 07/22/2019   PNEUMOCOCCAL CONJUGATE-20 09/06/2020   Pfizer Covid-19 Vaccine Bivalent Booster 56yr & up 04/11/2021   Pneumococcal Polysaccharide-23 06/07/2015   Tdap 06/07/2015   Colonoscopy- deferred per patient preference.   Shingrix Completed?: No.    Education has been provided regarding the importance of this vaccine. Patient has been advised to call insurance company to determine out of pocket expense if they have not yet received this vaccine. Advised may also receive vaccine at local pharmacy or Health Dept. Verbalized acceptance and understanding.  Screening Tests Health Maintenance  Topic Date Due   Lung Cancer Screening  Never done   Zoster Vaccines- Shingrix (1 of 2) 05/22/2022 (Originally 09/08/2019)   INFLUENZA VACCINE  07/01/2022 (Originally 10/31/2021)   COLONOSCOPY (Pts 45-464yrInsurance coverage will need to be confirmed)  02/20/2023 (Originally 09/08/2014)   Hepatitis C Screening  02/20/2023 (Originally 09/08/1987)   OPHTHALMOLOGY EXAM  03/06/2022   Diabetic kidney evaluation - Urine ACR  05/15/2022   HEMOGLOBIN A1C  08/16/2022   Diabetic kidney evaluation - GFR measurement  09/21/2022   FOOT EXAM  02/16/2023   Medicare Annual Wellness (AWV)  02/20/2023   COVID-19 Vaccine  Completed   HIV Screening  Completed   HPV VACCINES  Aged Out   Health Maintenance Health Maintenance Due  Topic Date Due   Lung Cancer Screening  Never done   Hepatitis C Screening: declined   Vision Screening: Recommended annual ophthalmology exams for early detection of glaucoma and other disorders of the eye.  Dental Screening: Recommended annual dental exams for proper oral hygiene  Community  Resource Referral / Chronic Care Management: CRR required this visit?  No   CCM required this visit?  No      Plan:     I have personally reviewed and noted the following in the patient's chart:   Medical and social history Use of alcohol, tobacco or illicit drugs  Current medications and supplements including opioid prescriptions. Patient is not currently taking opioid prescriptions. Functional ability and status Nutritional status Physical activity Advanced directives List of other physicians Hospitalizations, surgeries, and ER visits in previous 12 months Vitals Screenings to include cognitive, depression, and falls Referrals and appointments  In addition, I have reviewed and discussed with patient certain preventive protocols, quality metrics, and best practice recommendations. A written personalized care plan for preventive services as well as general preventive health recommendations were provided to patient.     DeLeta JunglingLPN   [id number]

## 2022-02-19 NOTE — Patient Instructions (Addendum)
Brad Jimenez , Thank you for taking time to come for your Medicare Wellness Visit. I appreciate your ongoing commitment to your health goals. Please review the following plan we discussed and let me know if I can assist you in the future.   These are the goals we discussed:  Goals       Patient Stated     Follow up with Primary Care Provider (pt-stated)      As needed.  Maintain healthy lifestyle      Other     Portion control meals      Monitor sugar intake Stay hydrated        This is a list of the screening recommended for you and due dates:  Health Maintenance  Topic Date Due   Screening for Lung Cancer  Never done   Zoster (Shingles) Vaccine (1 of 2) 05/22/2022*   Flu Shot  07/01/2022*   Colon Cancer Screening  02/20/2023*   Hepatitis C Screening: USPSTF Recommendation to screen - Ages 18-79 yo.  02/20/2023*   Eye exam for diabetics  03/06/2022   Yearly kidney health urinalysis for diabetes  05/15/2022   Hemoglobin A1C  08/16/2022   Yearly kidney function blood test for diabetes  09/21/2022   Complete foot exam   02/16/2023   Medicare Annual Wellness Visit  02/20/2023   COVID-19 Vaccine  Completed   HIV Screening  Completed   HPV Vaccine  Aged Out  *Topic was postponed. The date shown is not the original due date.   Conditions/risks identified: none new  Next appointment: Follow up in one year for your annual wellness visit   Preventive Care 40-64 Years, Male Preventive care refers to lifestyle choices and visits with your health care provider that can promote health and wellness. What does preventive care include? A yearly physical exam. This is also called an annual well check. Dental exams once or twice a year. Routine eye exams. Ask your health care provider how often you should have your eyes checked. Personal lifestyle choices, including: Daily care of your teeth and gums. Regular physical activity. Eating a healthy diet. Avoiding tobacco and drug  use. Limiting alcohol use. Practicing safe sex. Taking low-dose aspirin every day starting at age 47. What happens during an annual well check? The services and screenings done by your health care provider during your annual well check will depend on your age, overall health, lifestyle risk factors, and family history of disease. Counseling  Your health care provider may ask you questions about your: Alcohol use. Tobacco use. Drug use. Emotional well-being. Home and relationship well-being. Sexual activity. Eating habits. Work and work Statistician. Screening  You may have the following tests or measurements: Height, weight, and BMI. Blood pressure. Lipid and cholesterol levels. These may be checked every 5 years, or more frequently if you are over 26 years old. Skin check. Lung cancer screening. You may have this screening every year starting at age 48 if you have a 30-pack-year history of smoking and currently smoke or have quit within the past 15 years. Fecal occult blood test (FOBT) of the stool. You may have this test every year starting at age 24. Flexible sigmoidoscopy or colonoscopy. You may have a sigmoidoscopy every 5 years or a colonoscopy every 10 years starting at age 65. Prostate cancer screening. Recommendations will vary depending on your family history and other risks. Hepatitis C blood test. Hepatitis B blood test. Sexually transmitted disease (STD) testing. Diabetes screening. This is done  by checking your blood sugar (glucose) after you have not eaten for a while (fasting). You may have this done every 1-3 years. Discuss your test results, treatment options, and if necessary, the need for more tests with your health care provider. Vaccines  Your health care provider may recommend certain vaccines, such as: Influenza vaccine. This is recommended every year. Tetanus, diphtheria, and acellular pertussis (Tdap, Td) vaccine. You may need a Td booster every 10  years. Zoster vaccine. You may need this after age 33. Pneumococcal 13-valent conjugate (PCV13) vaccine. You may need this if you have certain conditions and have not been vaccinated. Pneumococcal polysaccharide (PPSV23) vaccine. You may need one or two doses if you smoke cigarettes or if you have certain conditions. Talk to your health care provider about which screenings and vaccines you need and how often you need them. This information is not intended to replace advice given to you by your health care provider. Make sure you discuss any questions you have with your health care provider. Document Released: 04/15/2015 Document Revised: 12/07/2015 Document Reviewed: 01/18/2015 Elsevier Interactive Patient Education  2017 [place] Prevention in the Home Falls can cause injuries. They can happen to people of all ages. There are many things you can do to make your home safe and to help prevent falls. What can I do on the outside of my home? Regularly fix the edges of walkways and driveways and fix any cracks. Remove anything that might make you trip as you walk through a door, such as a raised step or threshold. Trim any bushes or trees on the path to your home. Use bright outdoor lighting. Clear any walking paths of anything that might make someone trip, such as rocks or tools. Regularly check to see if handrails are loose or broken. Make sure that both sides of any steps have handrails. Any raised decks and porches should have guardrails on the edges. Have any leaves, snow, or ice cleared regularly. Use sand or salt on walking paths during winter. Clean up any spills in your garage right away. This includes oil or grease spills. What can I do in the bathroom? Use night lights. Install grab bars by the toilet and in the tub and shower. Do not use towel bars as grab bars. Use non-skid mats or decals in the tub or shower. If you need to sit down in the shower, use a plastic,  non-slip stool. Keep the floor dry. Clean up any water that spills on the floor as soon as it happens. Remove soap buildup in the tub or shower regularly. Attach bath mats securely with double-sided non-slip rug tape. Do not have throw rugs and other things on the floor that can make you trip. What can I do in the bedroom? Use night lights. Make sure that you have a light by your bed that is easy to reach. Do not use any sheets or blankets that are too big for your bed. They should not hang down onto the floor. Have a firm chair that has side arms. You can use this for support while you get dressed. Do not have throw rugs and other things on the floor that can make you trip. What can I do in the kitchen? Clean up any spills right away. Avoid walking on wet floors. Keep items that you use a lot in easy-to-reach places. If you need to reach something above you, use a strong step stool that has a grab bar. Keep electrical  cords out of the way. Do not use floor polish or wax that makes floors slippery. If you must use wax, use non-skid floor wax. Do not have throw rugs and other things on the floor that can make you trip. What can I do with my stairs? Do not leave any items on the stairs. Make sure that there are handrails on both sides of the stairs and use them. Fix handrails that are broken or loose. Make sure that handrails are as long as the stairways. Check any carpeting to make sure that it is firmly attached to the stairs. Fix any carpet that is loose or worn. Avoid having throw rugs at the top or bottom of the stairs. If you do have throw rugs, attach them to the floor with carpet tape. Make sure that you have a light switch at the top of the stairs and the bottom of the stairs. If you do not have them, ask someone to add them for you. What else can I do to help prevent falls? Wear shoes that: Do not have high heels. Have rubber bottoms. Are comfortable and fit you well. Are closed  at the toe. Do not wear sandals. If you use a stepladder: Make sure that it is fully opened. Do not climb a closed stepladder. Make sure that both sides of the stepladder are locked into place. Ask someone to hold it for you, if possible. Clearly mark and make sure that you can see: Any grab bars or handrails. First and last steps. Where the edge of each step is. Use tools that help you move around (mobility aids) if they are needed. These include: Canes. Walkers. Scooters. Crutches. Turn on the lights when you go into a dark area. Replace any light bulbs as soon as they burn out. Set up your furniture so you have a clear path. Avoid moving your furniture around. If any of your floors are uneven, fix them. If there are any pets around you, be aware of where they are. Review your medicines with your doctor. Some medicines can make you feel dizzy. This can increase your chance of falling. Ask your doctor what other things that you can do to help prevent falls. This information is not intended to replace advice given to you by your health care provider. Make sure you discuss any questions you have with your health care provider. Document Released: 01/13/2009 Document Revised: 08/25/2015 Document Reviewed: 04/23/2014 Elsevier Interactive Patient Education  2017 [employer].

## 2022-02-20 ENCOUNTER — Ambulatory Visit: Payer: Medicare HMO | Admitting: Audiology

## 2022-02-26 ENCOUNTER — Telehealth: Payer: Self-pay

## 2022-02-26 ENCOUNTER — Other Ambulatory Visit: Payer: Self-pay

## 2022-02-26 DIAGNOSIS — Z1211 Encounter for screening for malignant neoplasm of colon: Secondary | ICD-10-CM

## 2022-02-26 MED ORDER — NA SULFATE-K SULFATE-MG SULF 17.5-3.13-1.6 GM/177ML PO SOLN: 1.0000 | Freq: Once | ORAL | 0 refills | Status: AC

## 2022-02-26 NOTE — Telephone Encounter (Signed)
Gastroenterology Pre-Procedure Review  Request Date: 04/11/22 Requesting Physician: Dr. Marius Ditch  PATIENT REVIEW QUESTIONS: The patient responded to the following health history questions as indicated:    1. Are you having any GI issues? no 2. Do you have a personal history of Polyps? no 3. Do you have a family history of Colon Cancer or Polyps? no 4. Diabetes Mellitus? yes (Pt takes [name] and has been advised to stop it 2 days prior to colonoscopy) 5. Joint replacements in the past 12 months?no 6. Major health problems in the past 3 months?no 7. Any artificial heart valves, MVP, or defibrillator?no    MEDICATIONS & ALLERGIES:    Patient reports the following regarding taking any anticoagulation/antiplatelet therapy:   Plavix, Coumadin, Eliquis, Xarelto, Lovenox, Pradaxa, Brilinta, or Effient? no Aspirin? no  Patient confirms/reports the following medications:  Current Outpatient Medications  Medication Sig Dispense Refill   blood glucose meter kit and supplies KIT Dispense based on patient and insurance preference. Use once daily as directed. (FOR ICD-10 E11.9). 1 each 0   dapagliflozin propanediol (FARXIGA) 10 MG TABS tablet Take 1 tablet (10 mg total) by mouth daily before breakfast. 90 tablet 3   ezetimibe (ZETIA) 10 MG tablet Take 1 tablet (10 mg total) by mouth daily. 90 tablet 1   glucose blood (TRUE METRIX BLOOD GLUCOSE TEST) test strip TEST ONE TIME DAILY AS DIRECTED 100 strip 3   Lancets (ONETOUCH DELICA PLUS [id number]) MISC SMARTSIG:1 Topical Daily     Multiple Vitamin (MULTIVITAMIN WITH MINERALS) TABS tablet Take 1 tablet by mouth daily.     naproxen sodium (ALEVE) 220 MG tablet Take 2 tablets by mouth daily as needed.     rosuvastatin (CRESTOR) 40 MG tablet Take 1 tablet (40 mg total) by mouth daily. 90 tablet 1   sertraline (ZOLOFT) 50 MG tablet Take 1 tablet (50 mg total) by mouth daily. 90 tablet 1   No current facility-administered medications for this visit.     Patient confirms/reports the following allergies:  Allergies  Allergen Reactions   Lipitor [Atorvastatin Calcium]     Severe muscle aches and pain.   Fentanyl Other (See Comments)    Hyperactivity   Metformin And Related Other (See Comments)    Blurry vision    No orders of the defined types were placed in this encounter.   AUTHORIZATION INFORMATION Primary Insurance: 1D#: Group #:  Secondary Insurance: 1D#: Group #:  SCHEDULE INFORMATION: Date: 04/11/22 Time: Location: [facility]

## 2022-02-26 NOTE — Telephone Encounter (Signed)
Patient dropped off form for Dr. [name] to complete for handicap placard.  Form is in Dr. Ellen Henri color folder up front.

## 2022-02-27 NOTE — Telephone Encounter (Signed)
Please see if he can walk 200 or more feet with out stopping to rest. Does he use a cane or walker?

## 2022-03-02 NOTE — Telephone Encounter (Signed)
Patient states he is returning Cordelia Pen, RN's call.  I read Dr. Georges Mouse message to patient.  Patient states he cannot walk 200 or more feet without stopping to rest.  Patient states he uses a cane occasionally.

## 2022-03-02 NOTE — Telephone Encounter (Signed)
Please see my prior message. Thanks.

## 2022-03-06 DIAGNOSIS — Z0279 Encounter for issue of other medical certificate: Secondary | ICD-10-CM

## 2022-03-06 NOTE — Telephone Encounter (Signed)
Signed. Please make available for pick up.

## 2022-03-07 DIAGNOSIS — Z6831 Body mass index (BMI) 31.0-31.9, adult: Secondary | ICD-10-CM | POA: Diagnosis not present

## 2022-03-07 DIAGNOSIS — R0602 Shortness of breath: Secondary | ICD-10-CM | POA: Diagnosis not present

## 2022-03-07 DIAGNOSIS — H9201 Otalgia, right ear: Secondary | ICD-10-CM | POA: Diagnosis not present

## 2022-03-07 DIAGNOSIS — G4733 Obstructive sleep apnea (adult) (pediatric): Secondary | ICD-10-CM | POA: Diagnosis not present

## 2022-03-07 NOTE — Telephone Encounter (Signed)
Pt has been informed about form being ready for pick up. Form has been placed upfront in designated area in an envelope with his name on it.

## 2022-03-08 DIAGNOSIS — R0602 Shortness of breath: Secondary | ICD-10-CM | POA: Diagnosis not present

## 2022-03-08 DIAGNOSIS — G4733 Obstructive sleep apnea (adult) (pediatric): Secondary | ICD-10-CM | POA: Diagnosis not present

## 2022-03-09 ENCOUNTER — Ambulatory Visit
Admission: EM | Admit: 2022-03-09 | Discharge: 2022-03-09 | Disposition: A | Discharge status: Home / Self Care | Payer: Medicare HMO

## 2022-03-09 DIAGNOSIS — H9201 Otalgia, right ear: Secondary | ICD-10-CM | POA: Diagnosis not present

## 2022-03-09 MED ORDER — NEOMYCIN-POLYMYXIN-HC 3.5-10000-1 OT SUSP: 4.0000 [drp] | Freq: Three times a day (TID) | OTIC | 0 refills | Status: DC

## 2022-03-09 NOTE — ED Triage Notes (Signed)
Pt c/o right earache x6days  Pt states that [facility] and was told to take flonase and claritan for allergies. Pt states that that did not help.

## 2022-03-09 NOTE — ED Provider Notes (Signed)
[facility]    CSN: [id number] Arrival date & time: 03/09/22  1628      History   Chief Complaint Chief Complaint  Patient presents with   Ear Problem    HPI Brad Jimenez. is a 51 y.o. male.   Patient presents with persisting right-sided ear pain for 6 days.  Associated muffled hearing.  Denies drainage, itching, fevers, nasal congestion or rhinorrhea, sinus pressure or pain.  Endorses that he was seen at a [facility] during this timeframe, recommended use of Flonase and Allegra which he did try and were ineffective.  Has attempted use of ibuprofen as well.    Past Medical History:  Diagnosis Date   Anxiety    Arthritis    Asthma    Chickenpox    COPD (chronic obstructive pulmonary disease) ([place])    Depression    Diabetes ([place])    History of blood transfusion    Low back pain     Patient Active Problem List   Diagnosis Date Noted   Hearing difficulty of both ears 02/15/2022   Apneic episode 02/15/2022   Former smoker 02/15/2022   COVID-19 02/01/2022   Obesity 04/07/2019   Nevus 08/28/2017   Elevated LDL cholesterol level 06/07/2015   Thoracic back pain 04/13/2015   Chronic low back pain 03/22/2015   Anxiety and depression 03/22/2015   Diabetes ([employer]) 03/21/2015    Past Surgical History:  Procedure Laterality Date   APPENDECTOMY  2002   BACK SURGERY  2001, 2002   CHOLECYSTECTOMY  2012       Home Medications    Prior to Admission medications   Medication Sig Start Date End Date Taking? Authorizing Provider  blood glucose meter kit and supplies KIT Dispense based on patient and insurance preference. Use once daily as directed. (FOR ICD-10 E11.9). 04/19/21  Yes Leone Haven, MD  dapagliflozin propanediol (FARXIGA) 10 MG TABS tablet Take 1 tablet (10 mg total) by mouth daily before breakfast. 02/07/22  Yes Leone Haven, MD  ezetimibe (ZETIA) 10 MG tablet Take 1 tablet (10 mg total) by mouth daily. 01/01/22  Yes Leone Haven,  MD  glucose blood (TRUE METRIX BLOOD GLUCOSE TEST) test strip TEST ONE TIME DAILY AS DIRECTED 01/01/22  Yes Leone Haven, MD  Lancets (ONETOUCH DELICA PLUS [id number]) [place] SMARTSIG:1 Topical Daily 01/12/22  Yes [provider]  Multiple Vitamin (MULTIVITAMIN WITH MINERALS) TABS tablet Take 1 tablet by mouth daily.   Yes [provider]  naproxen sodium (ALEVE) 220 MG tablet Take 2 tablets by mouth daily as needed. 05/20/17  Yes [provider]  rosuvastatin (CRESTOR) 40 MG tablet Take 1 tablet (40 mg total) by mouth daily. 01/01/22  Yes Leone Haven, MD  Semaglutide, 1 MG/DOSE, (OZEMPIC, 1 MG/DOSE,) 2 MG/1.5ML SOPN Inject into the skin.   Yes [provider]  sertraline (ZOLOFT) 50 MG tablet Take 1 tablet (50 mg total) by mouth daily. 01/01/22  Yes Leone Haven, MD    Family History Family History  Problem Relation Age of Onset   Arthritis Mother    Lung cancer Mother    Heart disease Mother    Hypertension Mother    Diabetes Mother    Cancer Mother        Lung   Diabetes Sister    Tongue cancer Sister    Alcoholism Father    Stroke Father     Social History Social History   Tobacco Use  Smoking status: Former    Packs/day: 1.50    Years: 22.00    Total pack years: 33.00    Types: Cigarettes    Quit date: 02/20/2011    Years since quitting: 11.0   Smokeless tobacco: Never  Vaping Use   Vaping Use: Never used  Substance Use Topics   Alcohol use: Yes    Alcohol/week: 2.0 standard drinks of alcohol    Types: 2 Cans of beer per week    Comment: occasional   Drug use: No     Allergies   Lipitor [atorvastatin calcium], Fentanyl, and Metformin and related   Review of Systems Review of Systems   Physical Exam Triage Vital Signs ED Triage Vitals  Enc Vitals Group     BP 03/09/22 1700 105/68     Pulse Rate 03/09/22 1700 78     Resp 03/09/22 1700 16     Temp 03/09/22 1700 98 F (36.7 C)     Temp Source 03/09/22  1700 Oral     SpO2 03/09/22 1700 97 %     Weight 03/09/22 1659 198 lb (89.8 kg)     Height 03/09/22 1659 _0  (1.702 m)     Head Circumference --      Peak Flow --      Pain Score 03/09/22 1659 8     Pain Loc --      Pain Edu? --      Excl. in [place]? --    No data found.  Updated Vital Signs BP 105/68 (BP Location: Left Arm)   Pulse 78   Temp 98 F (36.7 C) (Oral)   Resp 16   Ht _1  (1.702 m)   Wt 198 lb (89.8 kg)   SpO2 97%   BMI 31.01 kg/m   Visual Acuity Right Eye Distance:   Left Eye Distance:   Bilateral Distance:    Right Eye Near:   Left Eye Near:    Bilateral Near:     Physical Exam Constitutional:      Appearance: Normal appearance.  HENT:     Right Ear: Tympanic membrane, ear canal and external ear normal.     Left Ear: Tympanic membrane, ear canal and external ear normal.     Nose: Nose normal.     Mouth/Throat:     Mouth: Mucous membranes are moist.     Pharynx: Oropharynx is clear.  Eyes:     Extraocular Movements: Extraocular movements intact.  Pulmonary:     Effort: Pulmonary effort is normal.  Neurological:     Mental Status: He is alert and oriented to person, place, and time. Mental status is at baseline.      UC Treatments / Results  Labs (all labs ordered are listed, but only abnormal results are displayed) Labs Reviewed - No data to display  EKG   Radiology No results found.  Procedures Procedures (including critical care time)  Medications Ordered in UC Medications - No data to display  Initial Impression / Assessment and Plan / UC Course  I have reviewed the triage vital signs and the nursing notes.  Pertinent labs & imaging results that were available during my care of the patient were reviewed by me and considered in my medical decision making (see chart for details).  Otalgia right ear  No abnormalities noted to the tympanic membrane or ear canal, discussed with patient but as symptoms have been present for 6 days  without signs of resolution we will  prophylactically provide coverage, Cortisporin drops prescribed and discussed administration, advised against any ear cleaning or object placed within the canal, recommended over-the-counter analgesics and warm compresses to the external ear for additional supportive care, may follow-up with his urgent Final Clinical Impressions(s) / UC Diagnoses   Final diagnoses:  None   Discharge Instructions   None    ED Prescriptions   None    PDMP not reviewed this encounter.   Hans Eden, NP 03/09/22 1723

## 2022-03-09 NOTE — Discharge Instructions (Signed)
Today you are being treated for ear pain  On exam there are no abnormalities to your eardrum or your ear canal however as your symptoms have been present for 6 days and are persisting we will provide an eardrop  Place 4 drops of Cortisporin into the right ear every 8 hours for 7 days, this is a mixture of an antibiotic with a little steroid and ideally will begin to give you relief  You may use over-the-counter Tylenol or ibuprofen as needed for pain  You may hold warm compresses to your external ear for additional comfort  Please have your primary doctor reevaluate your symptoms at your upcoming appointment on Tuesday, may follow-up with his urgent care as needed

## 2022-03-11 ENCOUNTER — Telehealth: Payer: Self-pay | Admitting: Pharmacy Technician

## 2022-03-11 DIAGNOSIS — Z596 Low income: Secondary | ICD-10-CM

## 2022-03-11 NOTE — Progress Notes (Signed)
[facility])                                            [place] Team    03/11/2022  Chaden Doom. [birth year] [id number]  Received both patient and provider portion(s) of patient assistance application(s) for Ozempic and [place]. Faxed completed application and required documents into [employer] and [employer].    Cardarius Senat P. Lilou Kneip, [place]  [phone]

## 2022-03-13 ENCOUNTER — Ambulatory Visit (INDEPENDENT_AMBULATORY_CARE_PROVIDER_SITE_OTHER): Payer: Medicare HMO | Admitting: Family Medicine

## 2022-03-13 ENCOUNTER — Encounter: Payer: Self-pay | Admitting: Family Medicine

## 2022-03-13 VITALS — BP 110/70 | HR 78 | Temp 98.1°F | Ht 68.0 in | Wt 197.2 lb

## 2022-03-13 DIAGNOSIS — R208 Other disturbances of skin sensation: Secondary | ICD-10-CM | POA: Diagnosis not present

## 2022-03-13 DIAGNOSIS — H60331 Swimmer's ear, right ear: Secondary | ICD-10-CM | POA: Diagnosis not present

## 2022-03-13 LAB — BASIC METABOLIC PANEL
BUN: 20 mg/dL (ref 6–23)
CO2: 27 mEq/L (ref 19–32)
Calcium: 9.4 mg/dL (ref 8.4–10.5)
Chloride: 100 mEq/L (ref 96–112)
Creatinine, Ser: 0.7 mg/dL (ref 0.40–1.50)
GFR: 105.94 mL/min (ref >60.00)
Glucose, Bld: 195 mg/dL — ABNORMAL HIGH (ref 70–99)
Potassium: 3.7 mEq/L (ref 3.5–5.1)
Sodium: 137 mEq/L (ref 135–145)

## 2022-03-13 LAB — TSH: TSH: 0.66 u[IU]/mL (ref 0.35–5.50)

## 2022-03-13 LAB — VITAMIN D 25 HYDROXY (VIT D DEFICIENCY, FRACTURES): VITD: 12.86 ng/mL — ABNORMAL LOW (ref 30.00–100.00)

## 2022-03-13 LAB — VITAMIN B12: Vitamin B-12: 236 pg/mL (ref 211–911)

## 2022-03-13 MED ORDER — CIPROFLOXACIN-DEXAMETHASONE 0.3-0.1 % OT SUSP: 4.0000 [drp] | Freq: Two times a day (BID) | OTIC | 0 refills | Status: AC

## 2022-03-13 NOTE — Assessment & Plan Note (Signed)
Undetermined cause.  I do not think this is shingles given the wide distribution of this discomfort.  Will check lab work to evaluate for an underlying cause.  Could consider gabapentin or Lyrica to treat this if needed.

## 2022-03-13 NOTE — Assessment & Plan Note (Signed)
We will switch him over to Ciprodex 4 drops into the right ear twice daily for 7 days.  If not improving he will let me know.

## 2022-03-13 NOTE — Patient Instructions (Signed)
Nice to see you. Please try the Ciprodex eardrops.  If they are too expensive please let me know.  You will use these for 7 days in your right ear. I went and check some lab work to evaluate for a cause of the pain on your side.

## 2022-03-13 NOTE — Progress Notes (Signed)
[name], MD Phone: [phone]  Brad Jimenez. is a 51 y.o. male who presents today for same-day visit.  Right ear pain: Patient notes this has been going on a week or so.  He [facility] went to [facility] and was told to try Flonase which was not helpful.  He then went to urgent care last week and was prescribed polymyxin eardrops and notes that helped some initially though last night it started to hurt more.  He notes no fevers.  Left sided torso skin pain: Patient notes burning sensation to touch in his left side from his shoulder down to his waist.  He notes no rash.  It does not involve his arm.  It is not exacerbated by movement.  He notes his shirt just being on hurts.  He notes no back pain.  He notes this has been going on for 3 days.  Social History   Tobacco Use  Smoking Status Former   Packs/day: 1.50   Years: 22.00   Total pack years: 33.00   Types: Cigarettes   Quit date: 02/20/2011   Years since quitting: 11.0  Smokeless Tobacco Never    Current Outpatient Medications on File Prior to Visit  Medication Sig Dispense Refill   blood glucose meter kit and supplies KIT Dispense based on patient and insurance preference. Use once daily as directed. (FOR ICD-10 E11.9). 1 each 0   dapagliflozin propanediol (FARXIGA) 10 MG TABS tablet Take 1 tablet (10 mg total) by mouth daily before breakfast. 90 tablet 3   ezetimibe (ZETIA) 10 MG tablet Take 1 tablet (10 mg total) by mouth daily. 90 tablet 1   glucose blood (TRUE METRIX BLOOD GLUCOSE TEST) test strip TEST ONE TIME DAILY AS DIRECTED 100 strip 3   Lancets (ONETOUCH DELICA PLUS [id number]) MISC SMARTSIG:1 Topical Daily     Multiple Vitamin (MULTIVITAMIN WITH MINERALS) TABS tablet Take 1 tablet by mouth daily.     naproxen sodium (ALEVE) 220 MG tablet Take 2 tablets by mouth daily as needed.     rosuvastatin (CRESTOR) 40 MG tablet Take 1 tablet (40 mg total) by mouth daily. 90 tablet 1   Semaglutide, 1  MG/DOSE, (OZEMPIC, 1 MG/DOSE,) 2 MG/1.5ML SOPN Inject into the skin.     sertraline (ZOLOFT) 50 MG tablet Take 1 tablet (50 mg total) by mouth daily. 90 tablet 1   No current facility-administered medications on file prior to visit.     ROS see history of present illness  Objective  Physical Exam Vitals:   03/13/22 1129  BP: 110/70  Pulse: 78  Temp: 98.1 F (36.7 C)  SpO2: 98%    BP Readings from Last 3 Encounters:  03/13/22 110/70  03/09/22 105/68  02/15/22 120/78   Wt Readings from Last 3 Encounters:  03/13/22 197 lb 3.2 oz (89.4 kg)  03/09/22 198 lb (89.8 kg)  02/19/22 199 lb (90.3 kg)    Physical Exam Constitutional:      General: He is not in acute distress.    Appearance: He is not diaphoretic.  Cardiovascular:     Rate and Rhythm: Normal rate and regular rhythm.     Heart sounds: Normal heart sounds.  Pulmonary:     Effort: Pulmonary effort is normal.     Breath sounds: Normal breath sounds.  Skin:    General: Skin is warm and dry.     Comments: Left torso with no apparent rash anteriorly or posteriorly, there is no muscular or  deep tenderness in his left torso or abdomen, there is discomfort on light touch of his skin on his left torso  Neurological:     Mental Status: He is alert.      Assessment/Plan: Please see individual problem list.  Problem List Items Addressed This Visit     Acute swimmer's ear of right side    We will switch him over to Ciprodex 4 drops into the right ear twice daily for 7 days.  If not improving he will let me know.      Relevant Medications   ciprofloxacin-dexamethasone (CIPRODEX) OTIC suspension   Allodynia - Primary    Undetermined cause.  I do not think this is shingles given the wide distribution of this discomfort.  Will check lab work to evaluate for an underlying cause.  Could consider gabapentin or Lyrica to treat this if needed.      Relevant Orders   B12   Vitamin D (25 hydroxy)   Basic Metabolic Panel  (BMET)   TSH     Return if symptoms worsen or fail to improve.   Eric Sonnenberg, MD [facility] [place]  

## 2022-03-14 ENCOUNTER — Telehealth: Payer: Self-pay

## 2022-03-14 NOTE — Telephone Encounter (Signed)
I put the document in Dr. Georges Mouse color folder up front, as document was for him.

## 2022-03-14 NOTE — Telephone Encounter (Signed)
Ronnell Guadalajara, CRT RCP, from [place], dropped off sleep study results for Dr. Deborra Medina. Document is located in providers color folder at front office.

## 2022-03-15 ENCOUNTER — Telehealth: Payer: Self-pay | Admitting: Family Medicine

## 2022-03-15 ENCOUNTER — Encounter: Payer: Self-pay | Admitting: Family Medicine

## 2022-03-15 DIAGNOSIS — E538 Deficiency of other specified B group vitamins: Secondary | ICD-10-CM

## 2022-03-15 DIAGNOSIS — E559 Vitamin D deficiency, unspecified: Secondary | ICD-10-CM

## 2022-03-15 DIAGNOSIS — R208 Other disturbances of skin sensation: Secondary | ICD-10-CM

## 2022-03-15 NOTE — Telephone Encounter (Signed)
Document in the sign basket for [facility]ria sleep study.  Andriea Hasegawa,cma

## 2022-03-15 NOTE — Telephone Encounter (Signed)
The patient is asking for pain medication for left sided torso skin pain: Patient notes burning sensation to touch in his left side from his shoulder down to his waist. He was seen by his provider on 03/13/22.

## 2022-03-16 MED ORDER — GABAPENTIN 100 MG PO CAPS: 100.0000 mg | ORAL_CAPSULE | Freq: Three times a day (TID) | ORAL | 3 refills | Status: DC

## 2022-03-16 MED ORDER — VITAMIN D (ERGOCALCIFEROL) 1.25 MG (50000 UNIT) PO CAPS: 50000.0000 [IU] | ORAL_CAPSULE | ORAL | 0 refills | Status: DC

## 2022-03-16 NOTE — Telephone Encounter (Signed)
lvm

## 2022-03-16 NOTE — Telephone Encounter (Signed)
LVM for patient to call back.   For patient to call back. Brad Jimenez,cma  

## 2022-03-16 NOTE — Telephone Encounter (Signed)
Please let the patient know that his vitamin D is low and his B12 is borderline low. These could account for some of his pain. He needs to start a prescription vitamin D supplement and B12 injections. I will send the vitamin D in for him.  He should start on vitamin B12 injections 1000 mcg weekly for 3 weeks and then 1000 mcg monthly.  I will send in gabapentin for him to try for the discomfort to touch in his skin.  He should monitor for drowsiness with starting the gabapentin and if that occurs he should let us know.  If the starting dose does not work he should let me know early next week so we can increase the dose.  He should have follow-up with me in about a month for this issue.  He will need his vitamin D rechecked in 8 weeks.  Order placed.

## 2022-03-19 NOTE — Telephone Encounter (Signed)
I called and spoke with the patient and informed him that the provider wants to increase the gabapentin to 200 mg 3 times a day and if that does not  help we can increase further and if it makes him drowsy to call and let us know and he understood. Jahne Krukowski,cma

## 2022-03-19 NOTE — Telephone Encounter (Signed)
I called and spoke with the patient and informed him of his lab results and he understood.  He stated he picked up the gabapentin but it is not helping him at all.  He started taking it on Friday and he says he does not feel any different still having pains.  He is scheduled for labs in 8 weeks and a nurse visit for his B12 injection.  Zanai Mallari,cma

## 2022-03-19 NOTE — Telephone Encounter (Signed)
Please let the patient know he can take 200 mg 3 times daily and see if that is more beneficial.  If that does not work by the end of the week we can increase it further.  If the increased dose makes him drowsy he needs to let us know.

## 2022-03-21 ENCOUNTER — Ambulatory Visit (INDEPENDENT_AMBULATORY_CARE_PROVIDER_SITE_OTHER): Payer: Medicare HMO

## 2022-03-21 DIAGNOSIS — E538 Deficiency of other specified B group vitamins: Secondary | ICD-10-CM | POA: Diagnosis not present

## 2022-03-21 MED ORDER — CYANOCOBALAMIN 1000 MCG/ML IJ SOLN
1000.0000 ug | Freq: Once | INTRAMUSCULAR | Status: AC
  Administered 2022-03-21: 1000 ug via INTRAMUSCULAR

## 2022-03-21 NOTE — Progress Notes (Signed)
Pt presented for their vitamin B12 injection. Pt was identified through two identifiers. Pt tolerated shot well in their left  deltoid.  

## 2022-03-28 ENCOUNTER — Telehealth: Payer: Self-pay | Admitting: Family Medicine

## 2022-03-28 ENCOUNTER — Ambulatory Visit (INDEPENDENT_AMBULATORY_CARE_PROVIDER_SITE_OTHER): Payer: Medicare HMO

## 2022-03-28 DIAGNOSIS — E538 Deficiency of other specified B group vitamins: Secondary | ICD-10-CM

## 2022-03-28 DIAGNOSIS — R208 Other disturbances of skin sensation: Secondary | ICD-10-CM

## 2022-03-28 MED ORDER — CYANOCOBALAMIN 1000 MCG/ML IJ SOLN
1000.0000 ug | Freq: Once | INTRAMUSCULAR | Status: AC
  Administered 2022-03-28: 1000 ug via INTRAMUSCULAR

## 2022-03-28 NOTE — Progress Notes (Signed)
Patient presented for B 12 injection to left deltoid, patient voiced no concerns nor showed any signs of distress during injection. 

## 2022-03-28 NOTE — Telephone Encounter (Signed)
Patient was in office and is requesting a referral to Neurology. His side is still hurting him, patient stated that Dr Caryl Bis knows about it.

## 2022-03-29 ENCOUNTER — Encounter: Payer: Self-pay | Admitting: Neurology

## 2022-03-29 NOTE — Telephone Encounter (Signed)
Referral placed.

## 2022-03-29 NOTE — Addendum Note (Signed)
Addended by: Leone Haven on: 03/29/2022 09:31 AM   Modules accepted: Orders

## 2022-04-03 ENCOUNTER — Encounter: Payer: Self-pay | Admitting: Family Medicine

## 2022-04-06 ENCOUNTER — Encounter: Payer: Self-pay | Admitting: Family Medicine

## 2022-04-06 ENCOUNTER — Ambulatory Visit (INDEPENDENT_AMBULATORY_CARE_PROVIDER_SITE_OTHER): Payer: Medicare HMO | Admitting: Family Medicine

## 2022-04-06 DIAGNOSIS — J988 Other specified respiratory disorders: Secondary | ICD-10-CM | POA: Diagnosis not present

## 2022-04-06 DIAGNOSIS — E119 Type 2 diabetes mellitus without complications: Secondary | ICD-10-CM

## 2022-04-06 DIAGNOSIS — B9789 Other viral agents as the cause of diseases classified elsewhere: Secondary | ICD-10-CM | POA: Diagnosis not present

## 2022-04-06 LAB — POCT INFLUENZA A/B
Influenza A, POC: NEGATIVE
Influenza B, POC: NEGATIVE

## 2022-04-06 LAB — POC COVID19 BINAXNOW: SARS Coronavirus 2 Ag: NEGATIVE

## 2022-04-06 NOTE — Patient Instructions (Addendum)
Please make sure you pay attention to the amount of Tylenol you are taking. You can take extra strength Tylenol 1000 mg every 8 hours as needed or you can take the extended release Tylenol arthritis 1300 mg every 8 hours as needed.  Please take into account any Tylenol that may be in your cough medication. If you develop shortness of breath, cough productive of blood, fevers, or any worsening symptoms please seek medical attention.

## 2022-04-06 NOTE — Assessment & Plan Note (Signed)
Discussed his symptoms are likely related to a viral upper respiratory infection.  Discussed supportive care with continued Tylenol and ibuprofen use.  He was given instructions on appropriate Tylenol dosing.  If he has worsening symptoms or symptoms are not improving by mid next week he will let us know.

## 2022-04-06 NOTE — Assessment & Plan Note (Signed)
We will plan to increase his Ozempic to 2 mg weekly and continue Farxiga 10 mg daily.  I will check with our clinical pharmacist on how to get his dosing increased on his Ozempic.  I advised that if he does not hear anything from [place] next week on this he should contact the office.

## 2022-04-06 NOTE — Progress Notes (Signed)
Brad Rumps, MD Phone: [phone]  Brad Jimenez. is a 52 y.o. male who presents today for f/u.  DIABETES Disease Monitoring: Blood Sugar ranges-140-180 fasting, 280-320 after eating Polyuria/phagia/dipsia- no      Medications: Compliance- taking ozempic 1 mg weekly, farxiga Hypoglycemic symptoms- no  Respiratory infection: Patient notes this started 5 days ago.  He has had some cough that is productive of yellow mucus, some sinus congestion, postnasal drip, and some body aches.  No fevers or shortness of breath.  He had a negative home COVID test yesterday.  No known COVID or flu exposures.  He has been using Robitussin and Tylenol for his symptoms.   Social History   Tobacco Use  Smoking Status Former   Packs/day: 1.50   Years: 22.00   Total pack years: 33.00   Types: Cigarettes   Quit date: 02/20/2011   Years since quitting: 11.1  Smokeless Tobacco Never    Current Outpatient Medications on File Prior to Visit  Medication Sig Dispense Refill   blood glucose meter kit and supplies KIT Dispense based on patient and insurance preference. Use once daily as directed. (FOR ICD-10 E11.9). 1 each 0   dapagliflozin propanediol (FARXIGA) 10 MG TABS tablet Take 1 tablet (10 mg total) by mouth daily before breakfast. 90 tablet 3   ezetimibe (ZETIA) 10 MG tablet Take 1 tablet (10 mg total) by mouth daily. 90 tablet 1   gabapentin (NEURONTIN) 100 MG capsule Take 1 capsule (100 mg total) by mouth 3 (three) times daily. 90 capsule 3   glucose blood (TRUE METRIX BLOOD GLUCOSE TEST) test strip TEST ONE TIME DAILY AS DIRECTED 100 strip 3   Lancets (ONETOUCH DELICA PLUS [id number]) MISC SMARTSIG:1 Topical Daily     Multiple Vitamin (MULTIVITAMIN WITH MINERALS) TABS tablet Take 1 tablet by mouth daily.     naproxen sodium (ALEVE) 220 MG tablet Take 2 tablets by mouth daily as needed.     rosuvastatin (CRESTOR) 40 MG tablet Take 1 tablet (40 mg total) by mouth daily. 90 tablet 1    Semaglutide, 1 MG/DOSE, (OZEMPIC, 1 MG/DOSE,) 2 MG/1.5ML SOPN Inject into the skin.     sertraline (ZOLOFT) 50 MG tablet Take 1 tablet (50 mg total) by mouth daily. 90 tablet 1   Vitamin D, Ergocalciferol, (DRISDOL) 1.25 MG (50000 UNIT) CAPS capsule Take 1 capsule (50,000 Units total) by mouth every 7 (seven) days. 8 capsule 0   No current facility-administered medications on file prior to visit.     ROS see history of present illness  Objective  Physical Exam Vitals:   04/06/22 1647  BP: 120/78  Pulse: 91  Temp: 98.6 F (37 C)  SpO2: 98%    BP Readings from Last 3 Encounters:  04/06/22 120/78  03/13/22 110/70  03/09/22 105/68   Wt Readings from Last 3 Encounters:  04/06/22 196 lb 3.2 oz (89 kg)  03/13/22 197 lb 3.2 oz (89.4 kg)  03/09/22 198 lb (89.8 kg)    Physical Exam Constitutional:      General: He is not in acute distress.    Appearance: He is not diaphoretic.  HENT:     Right Ear: Tympanic membrane normal.     Left Ear: Tympanic membrane normal.  Cardiovascular:     Rate and Rhythm: Normal rate and regular rhythm.     Heart sounds: Normal heart sounds.  Pulmonary:     Effort: Pulmonary effort is normal.     Breath sounds: Normal breath sounds.  Lymphadenopathy:     Cervical: No cervical adenopathy.  Skin:    General: Skin is warm and dry.  Neurological:     Mental Status: He is alert.      Assessment/Plan: Please see individual problem list.  Type 2 diabetes mellitus without complication, without long-term current use of insulin ([place]) Assessment & Plan: We will plan to increase his Ozempic to 2 mg weekly and continue Farxiga 10 mg daily.  I will check with our clinical pharmacist on how to get his dosing increased on his Ozempic.  I advised that if he does not hear anything from [place] next week on this he should contact the office.   Viral respiratory infection Assessment & Plan: Discussed his symptoms are likely related to a viral upper  respiratory infection.  Discussed supportive care with continued Tylenol and ibuprofen use.  He was given instructions on appropriate Tylenol dosing.  If he has worsening symptoms or symptoms are not improving by mid next week he will let us know.  Orders: -     POC COVID-19 BinaxNow -     POCT Influenza A/B     Return in about 2 months (around 06/05/2022) for Diabetes.   Brad Rumps, MD [place]

## 2022-04-09 NOTE — Progress Notes (Signed)
Form is filled out and in the sign basket for your signature. Daryon Remmert,cma

## 2022-04-11 ENCOUNTER — Encounter
Admission: RE | Disposition: A | Discharge status: Home / Self Care | Payer: Self-pay | Source: Home / Self Care | Attending: Gastroenterology

## 2022-04-11 ENCOUNTER — Ambulatory Visit
Admission: RE | Admit: 2022-04-11 | Discharge: 2022-04-11 | Disposition: A | Discharge status: Home / Self Care | Payer: Medicare HMO | Attending: Gastroenterology | Admitting: Gastroenterology

## 2022-04-11 ENCOUNTER — Telehealth: Payer: Self-pay | Admitting: Pharmacy Technician

## 2022-04-11 ENCOUNTER — Ambulatory Visit: Payer: Medicare HMO | Admitting: Certified Registered"

## 2022-04-11 ENCOUNTER — Telehealth: Payer: Self-pay

## 2022-04-11 DIAGNOSIS — F419 Anxiety disorder, unspecified: Secondary | ICD-10-CM | POA: Diagnosis not present

## 2022-04-11 DIAGNOSIS — J449 Chronic obstructive pulmonary disease, unspecified: Secondary | ICD-10-CM | POA: Diagnosis not present

## 2022-04-11 DIAGNOSIS — Z7984 Long term (current) use of oral hypoglycemic drugs: Secondary | ICD-10-CM | POA: Insufficient documentation

## 2022-04-11 DIAGNOSIS — Z87891 Personal history of nicotine dependence: Secondary | ICD-10-CM | POA: Insufficient documentation

## 2022-04-11 DIAGNOSIS — M1990 Unspecified osteoarthritis, unspecified site: Secondary | ICD-10-CM | POA: Diagnosis not present

## 2022-04-11 DIAGNOSIS — Z7985 Long-term (current) use of injectable non-insulin antidiabetic drugs: Secondary | ICD-10-CM | POA: Diagnosis not present

## 2022-04-11 DIAGNOSIS — Z1211 Encounter for screening for malignant neoplasm of colon: Secondary | ICD-10-CM | POA: Diagnosis not present

## 2022-04-11 DIAGNOSIS — M549 Dorsalgia, unspecified: Secondary | ICD-10-CM | POA: Insufficient documentation

## 2022-04-11 DIAGNOSIS — G4730 Sleep apnea, unspecified: Secondary | ICD-10-CM | POA: Diagnosis not present

## 2022-04-11 DIAGNOSIS — E1165 Type 2 diabetes mellitus with hyperglycemia: Secondary | ICD-10-CM | POA: Diagnosis not present

## 2022-04-11 DIAGNOSIS — G8929 Other chronic pain: Secondary | ICD-10-CM | POA: Diagnosis not present

## 2022-04-11 DIAGNOSIS — F32A Depression, unspecified: Secondary | ICD-10-CM | POA: Insufficient documentation

## 2022-04-11 DIAGNOSIS — R69 Illness, unspecified: Secondary | ICD-10-CM | POA: Diagnosis not present

## 2022-04-11 DIAGNOSIS — E119 Type 2 diabetes mellitus without complications: Secondary | ICD-10-CM | POA: Diagnosis not present

## 2022-04-11 DIAGNOSIS — Z596 Low income: Secondary | ICD-10-CM

## 2022-04-11 HISTORY — PX: COLONOSCOPY WITH PROPOFOL: SHX5780

## 2022-04-11 LAB — GLUCOSE, CAPILLARY: Glucose-Capillary: 173 mg/dL — ABNORMAL HIGH (ref 70–99)

## 2022-04-11 SURGERY — COLONOSCOPY WITH PROPOFOL
Anesthesia: General

## 2022-04-11 MED ORDER — FENTANYL CITRATE (PF) 100 MCG/2ML IJ SOLN
INTRAMUSCULAR | Status: DC | PRN
  Administered 2022-04-11 (×2): 50 ug via INTRAVENOUS

## 2022-04-11 MED ORDER — SODIUM CHLORIDE 0.9 % IV SOLN
INTRAVENOUS | Status: DC
  Administered 2022-04-11: 20 mL/h via INTRAVENOUS

## 2022-04-11 MED ORDER — DEXAMETHASONE SODIUM PHOSPHATE 10 MG/ML IJ SOLN
INTRAMUSCULAR | Status: DC | PRN
  Administered 2022-04-11: 5 mg via INTRAVENOUS

## 2022-04-11 MED ORDER — SUCCINYLCHOLINE CHLORIDE 200 MG/10ML IV SOSY
PREFILLED_SYRINGE | INTRAVENOUS | Status: DC | PRN
  Administered 2022-04-11: 140 mg via INTRAVENOUS

## 2022-04-11 MED ORDER — LIDOCAINE HCL (CARDIAC) PF 100 MG/5ML IV SOSY
PREFILLED_SYRINGE | INTRAVENOUS | Status: DC | PRN
  Administered 2022-04-11: 100 mg via INTRAVENOUS

## 2022-04-11 MED ORDER — PROPOFOL 10 MG/ML IV BOLUS
INTRAVENOUS | Status: AC
  Filled 2022-04-11: qty 40

## 2022-04-11 MED ORDER — SUCCINYLCHOLINE CHLORIDE 200 MG/10ML IV SOSY
PREFILLED_SYRINGE | INTRAVENOUS | Status: AC
  Filled 2022-04-11: qty 10

## 2022-04-11 MED ORDER — PROPOFOL 10 MG/ML IV BOLUS
INTRAVENOUS | Status: DC | PRN
  Administered 2022-04-11: 150 mg via INTRAVENOUS

## 2022-04-11 MED ORDER — PHENYLEPHRINE HCL (PRESSORS) 10 MG/ML IV SOLN
INTRAVENOUS | Status: DC | PRN
  Administered 2022-04-11: 80 ug via INTRAVENOUS

## 2022-04-11 MED ORDER — LIDOCAINE HCL (PF) 2 % IJ SOLN
INTRAMUSCULAR | Status: AC
  Filled 2022-04-11: qty 5

## 2022-04-11 MED ORDER — FENTANYL CITRATE (PF) 100 MCG/2ML IJ SOLN
INTRAMUSCULAR | Status: AC
  Filled 2022-04-11: qty 2

## 2022-04-11 MED ORDER — PHENYLEPHRINE 80 MCG/ML (10ML) SYRINGE FOR IV PUSH (FOR BLOOD PRESSURE SUPPORT)
PREFILLED_SYRINGE | INTRAVENOUS | Status: AC
  Filled 2022-04-11: qty 10

## 2022-04-11 MED ORDER — DEXAMETHASONE SODIUM PHOSPHATE 10 MG/ML IJ SOLN
INTRAMUSCULAR | Status: AC
  Filled 2022-04-11: qty 1

## 2022-04-11 MED ORDER — ONDANSETRON HCL 4 MG/2ML IJ SOLN
INTRAMUSCULAR | Status: DC | PRN
  Administered 2022-04-11: 4 mg via INTRAVENOUS

## 2022-04-11 NOTE — Telephone Encounter (Signed)
Contacted patient to reschedule his colonoscopy due to [place] not being stopped 7 days before procedure.  Dr. Marius Ditch has also advised a 2 day prep due to pt not being cleaned out.  Will await call back to reschedule patient.  Thanks, [place], [place]

## 2022-04-11 NOTE — Transfer of Care (Signed)
Immediate Anesthesia Transfer of Care Note  Patient: Brad Jimenez.  Procedure(s) Performed: COLONOSCOPY WITH PROPOFOL  Patient Location: PACU  Anesthesia Type:General  Level of Consciousness: awake  Airway & Oxygen Therapy: Patient Spontanous Breathing and Patient connected to nasal cannula oxygen  Post-op Assessment: Report given to RN and Post -op Vital signs reviewed and stable  Post vital signs: Reviewed and stable  Last Vitals:  Vitals Value Taken Time  BP 126/85 04/11/22 0822  Temp 35.7 C 04/11/22 0822  Pulse 86 04/11/22 0827  Resp 12 04/11/22 0827  SpO2 98 % 04/11/22 0827  Vitals shown include unvalidated device data.  Last Pain:  Vitals:   04/11/22 0822  TempSrc: Temporal  PainSc: 0-No pain         Complications: No notable events documented.

## 2022-04-11 NOTE — Telephone Encounter (Signed)
Dr. Marius Ditch has advised 2 day prep with a large volume prep. Will schedule with a [place], or [place] if pt calls back to reschedule.  Thanks,  [place], [place]

## 2022-04-11 NOTE — H&P (Signed)
Cephas Darby, MD [address]  [place]  [place], [place] [address]  Main: [phone]  Fax: [phone] Pager: [phone]  Primary Care Physician:  Leone Haven, MD Primary Gastroenterologist:  Dr. Cephas Darby  Pre-Procedure History & Physical: HPI:  Brad Jimenez. is a 52 y.o. male is here for an colonoscopy.   Past Medical History:  Diagnosis Date   Anxiety    Arthritis    Asthma    Chickenpox    COPD (chronic obstructive pulmonary disease) ([facility])    Depression    Diabetes ([place])    History of blood transfusion    Low back pain     Past Surgical History:  Procedure Laterality Date   APPENDECTOMY  2002   BACK SURGERY  2001, 2002   CHOLECYSTECTOMY  2012    Prior to Admission medications   Medication Sig Start Date End Date Taking? Authorizing Provider  blood glucose meter kit and supplies KIT Dispense based on patient and insurance preference. Use once daily as directed. (FOR ICD-10 E11.9). 04/19/21  Yes Leone Haven, MD  dapagliflozin propanediol (FARXIGA) 10 MG TABS tablet Take 1 tablet (10 mg total) by mouth daily before breakfast. 02/07/22  Yes Leone Haven, MD  ezetimibe (ZETIA) 10 MG tablet Take 1 tablet (10 mg total) by mouth daily. 01/01/22  Yes Leone Haven, MD  gabapentin (NEURONTIN) 100 MG capsule Take 1 capsule (100 mg total) by mouth 3 (three) times daily. 03/16/22  Yes Leone Haven, MD  glucose blood (TRUE METRIX BLOOD GLUCOSE TEST) test strip TEST ONE TIME DAILY AS DIRECTED 01/01/22  Yes Leone Haven, MD  Lancets (ONETOUCH DELICA PLUS [id number]) [place] SMARTSIG:1 Topical Daily 01/12/22  Yes [provider]  Multiple Vitamin (MULTIVITAMIN WITH MINERALS) TABS tablet Take 1 tablet by mouth daily.   Yes [provider]  rosuvastatin (CRESTOR) 40 MG tablet Take 1 tablet (40 mg total) by mouth daily. 01/01/22  Yes Leone Haven, MD  Semaglutide, 1 MG/DOSE, (OZEMPIC, 1 MG/DOSE,) 2 MG/1.5ML SOPN  Inject into the skin.   Yes [provider]  sertraline (ZOLOFT) 50 MG tablet Take 1 tablet (50 mg total) by mouth daily. 01/01/22  Yes Leone Haven, MD  Vitamin D, Ergocalciferol, (DRISDOL) 1.25 MG (50000 UNIT) CAPS capsule Take 1 capsule (50,000 Units total) by mouth every 7 (seven) days. 03/16/22  Yes Leone Haven, MD  naproxen sodium (ALEVE) 220 MG tablet Take 2 tablets by mouth daily as needed. 05/20/17   [provider]    Allergies as of 02/27/2022 - Review Complete 02/19/2022  Allergen Reaction Noted   Lipitor [atorvastatin calcium]  09/09/2018   Fentanyl Other (See Comments) 02/20/2015   Metformin and related Other (See Comments) 03/18/2017    Family History  Problem Relation Age of Onset   Arthritis Mother    Lung cancer Mother    Heart disease Mother    Hypertension Mother    Diabetes Mother    Cancer Mother        Lung   Diabetes Sister    Tongue cancer Sister    Alcoholism Father    Stroke Father     Social History   Socioeconomic History   Marital status: Married    Spouse name: Not on file   Number of children: Not on file   Years of education: Not on file   Highest education level: Not on file  Occupational History   Not on  file  Tobacco Use   Smoking status: Former    Packs/day: 1.50    Years: 22.00    Total pack years: 33.00    Types: Cigarettes    Quit date: 02/20/2011    Years since quitting: 11.1   Smokeless tobacco: Never  Vaping Use   Vaping Use: Never used  Substance and Sexual Activity   Alcohol use: Yes    Alcohol/week: 2.0 standard drinks of alcohol    Types: 2 Cans of beer per week    Comment: occasional   Drug use: No   Sexual activity: Yes  Other Topics Concern   Not on file  Social History Narrative   Not on file   Social Determinants of Health   Financial Resource Strain: Low Risk  (02/19/2022)   Overall Financial Resource Strain (CARDIA)    Difficulty of Paying Living Expenses: Not hard at  all  Food Insecurity: No Food Insecurity (02/19/2022)   Hunger Vital Sign    Worried About Running Out of Food in the Last Year: Never true    Ran Out of Food in the Last Year: Never true  Transportation Needs: No Transportation Needs (02/19/2022)   PRAPARE - Hydrologist (Medical): No    Lack of Transportation (Non-Medical): No  Physical Activity: Insufficiently Active (02/19/2022)   Exercise Vital Sign    Days of Exercise per Week: 3 days    Minutes of Exercise per Session: 20 min  Stress: No Stress Concern Present (02/19/2022)   [place]    Feeling of Stress : Not at all  Social Connections: Unknown (02/19/2022)   Social Connection and Isolation Panel [NHANES]    Frequency of Communication with Friends and Family: More than three times a week    Frequency of Social Gatherings with Friends and Family: More than three times a week    Attends Religious Services: Not on file    Active Member of [place] or Organizations: Not on file    Attends Archivist Meetings: Not on file    Marital Status: Married  Intimate Partner Violence: Not At Risk (02/19/2022)   Humiliation, Afraid, Rape, and Kick questionnaire    Fear of Current or Ex-Partner: No    Emotionally Abused: No    Physically Abused: No    Sexually Abused: No    Review of Systems: See HPI, otherwise negative ROS  Physical Exam: BP 106/76   Pulse 82   Temp (!) 96.1 F (35.6 C) (Temporal)   Resp 20   Ht '5\' 7"'$  (1.702 m)   Wt 87.7 kg   SpO2 100%   BMI 30.29 kg/m  General:   Alert,  pleasant and cooperative in NAD Head:  Normocephalic and atraumatic. Neck:  Supple; no masses or thyromegaly. Lungs:  Clear throughout to auscultation.    Heart:  Regular rate and rhythm. Abdomen:  Soft, nontender and nondistended. Normal bowel sounds, without guarding, and without rebound.   Neurologic:  Alert and  oriented x4;   grossly normal neurologically.  Impression/Plan: Brad Jimenez. is here for an colonoscopy to be performed for colon cancer screening  Risks, benefits, limitations, and alternatives regarding  colonoscopy have been reviewed with the patient.  Questions have been answered.  All parties agreeable.   Sherri Sear, MD  04/11/2022, 7:39 AM

## 2022-04-11 NOTE — Anesthesia Postprocedure Evaluation (Signed)
Anesthesia Post Note  Patient: Brad Jimenez.  Procedure(s) Performed: COLONOSCOPY WITH PROPOFOL  Patient location during evaluation: PACU Anesthesia Type: General Level of consciousness: awake and alert Pain management: pain level controlled Vital Signs Assessment: post-procedure vital signs reviewed and stable Respiratory status: spontaneous breathing, nonlabored ventilation, respiratory function stable and patient connected to nasal cannula oxygen Cardiovascular status: blood pressure returned to baseline and stable Postop Assessment: no apparent nausea or vomiting Anesthetic complications: no   No notable events documented.   Last Vitals:  Vitals:   04/11/22 0822 04/11/22 0833  BP: 126/85 129/85  Pulse: 87 83  Resp:  20  Temp: (!) 35.7 C   SpO2: 98% 98%    Last Pain:  Vitals:   04/11/22 0833  TempSrc:   PainSc: 0-No pain                 Ilene Qua

## 2022-04-11 NOTE — Progress Notes (Addendum)
[facility])                                            [place] Team    04/11/2022  Brad Jimenez. 1970-07-26 [id number]  Care coordination call placed to [place] in regard to [place] application.  Spoke to Opal Sidles who informs patient is APPROVED 04/06/22-04/02/23. Medication will ship to prescribing provider's office based on last time it was filled in 2023.  Care coordination call also placed to [employer] in regard to [facility] application.  Spoke to [place] who informs patient is APPROVED 04/02/22-04/02/23. Medication will auto ship to patient's home address on file based on last fill date in 2023.   Brad Jimenez [name], [place]  [phone]

## 2022-04-11 NOTE — Anesthesia Procedure Notes (Signed)
Procedure Name: Intubation Date/Time: 04/11/2022 7:55 AM  Performed by: Cammie Sickle, CRNAPre-anesthesia Checklist: Patient identified, Patient being monitored, Timeout performed, Emergency Drugs available and Suction available Patient Re-evaluated:Patient Re-evaluated prior to induction Oxygen Delivery Method: Circle system utilized Preoxygenation: Pre-oxygenation with 100% oxygen Induction Type: IV induction, Rapid sequence and Cricoid Pressure applied Laryngoscope Size: 3 and McGraph Grade View: Grade I Tube type: Oral Tube size: 7.5 mm Number of attempts: 1 Airway Equipment and Method: Stylet Placement Confirmation: ETT inserted through vocal cords under direct vision, positive ETCO2 and breath sounds checked- equal and bilateral Secured at: 22 cm Tube secured with: Tape Dental Injury: Teeth and Oropharynx as per pre-operative assessment

## 2022-04-11 NOTE — Anesthesia Preprocedure Evaluation (Addendum)
Anesthesia Evaluation  Patient identified by MRN, date of birth, ID band Patient awake    Reviewed: Allergy & Precautions, NPO status , Patient's Chart, lab work & pertinent test results  Airway Mallampati: III  TM Distance: >3 FB Neck ROM: full    Dental no notable dental hx.    Pulmonary sleep apnea , former smoker   Pulmonary exam normal        Cardiovascular negative cardio ROS Normal cardiovascular exam     Neuro/Psych  PSYCHIATRIC DISORDERS Anxiety Depression    negative neurological ROS     GI/Hepatic negative GI ROS, Neg liver ROS,,,  Endo/Other  diabetes, Poorly Controlled, Type 2, Oral Hypoglycemic Agents    Renal/GU negative Renal ROS  negative genitourinary   Musculoskeletal  (+) Arthritis ,  Chronic back pain     Abdominal   Peds  Hematology negative hematology ROS (+)   Anesthesia Other Findings Past Medical History: No date: Anxiety No date: Arthritis No date: Asthma No date: Chickenpox No date: COPD (chronic obstructive pulmonary disease) ([facility]) No date: Depression No date: Diabetes ([place]) No date: History of blood transfusion No date: Low back pain  Past Surgical History: 2002: APPENDECTOMY 2001, 2002: BACK SURGERY 2012: CHOLECYSTECTOMY  BMI    Body Mass Index: 30.29 kg/m      Reproductive/Obstetrics negative OB ROS                             Anesthesia Physical Anesthesia Plan  ASA: 3  Anesthesia Plan: General   Post-op Pain Management: Minimal or no pain anticipated   Induction: Intravenous  PONV Risk Score and Plan: Ondansetron, Dexamethasone, Midazolam and Treatment may vary due to age or medical condition  Airway Management Planned: Oral ETT  Additional Equipment:   Intra-op Plan:   Post-operative Plan: Extubation in OR  Informed Consent: I have reviewed the patients History and Physical, chart, labs and discussed the procedure including  the risks, benefits and alternatives for the proposed anesthesia with the patient or authorized representative who has indicated his/her understanding and acceptance.     Dental Advisory Given  Plan Discussed with: Anesthesiologist, CRNA and Surgeon  Anesthesia Plan Comments: (Patient consented for risks of anesthesia including but not limited to:  - adverse reactions to medications - risk of airway placement if required - damage to eyes, teeth, lips or other oral mucosa - nerve damage due to positioning  - sore throat or hoarseness - Damage to heart, brain, nerves, lungs, other parts of body or loss of life  Patient voiced understanding. Patient last dose of Ozempiv 04/06/22. Discussed risks of perioperative GLP-1 use including aspiration. Patient given option to reschedule procedure vs proceed with GETA. Patient voices understanding and wishes to proceed with GETA. )        Anesthesia Quick Evaluation

## 2022-04-11 NOTE — Progress Notes (Signed)
Initial neurology clinic note  Brad Jimenez. MRN: [id number] DOB: 08-Sep-1970  Referring provider: Leone Haven, MD  Primary care provider: Leone Haven, MD  Reason for consult:  pain of left torso  Subjective:  This is [name]., a 52 y.o. right-handed male with a medical history of DM, HLD, depression, degenerative disc disease s/p surgery x 2 (lumbar spine), former smoker who presents to neurology clinic with pain of left torso. The patient is alone today.  On 03/02/2022, patient started having pain in right ear. He went to [facility]. Nothing was seen so was told to monitor. He went again a few days later and got ear drops. After taking them, the next morning he had pain on left torso from arm pit to hip on front and back of torso. He denies having any rash or problems on skin in this area. On 03/13/22 patient saw PCP for ear and got another drop and ear symptoms resolved soon after. His vit D was low and B12 was borderline. He was given B12 injections x 2 and vit D supplementation. He is scheduled for another B12 injection in March of 2024. He was prescribed gabapentin 200 mg TID (in mid 03/2022) that did help with symptoms in terms of easing the pain.  He got cold like symptoms on 04/02/22. On 04/04/22 his pain on the torso resolved. He has tapered off of gabapentin since and not had further symptoms.  He has never had similar symptoms. He denies any numbness or tingling in any extremities. He denies weakness, imbalance, or falls.  He denies constitutional symptoms (fevers, weight loss that can't be explained).   EtOH use: occasional beer Restrictive diet: No Family history of neurologic disease: No   MEDICATIONS:  Outpatient Encounter Medications as of 04/19/2022  Medication Sig Note   blood glucose meter kit and supplies KIT Dispense based on patient and insurance preference. Use once daily as directed. (FOR ICD-10 E11.9).    dapagliflozin  propanediol (FARXIGA) 10 MG TABS tablet Take 1 tablet (10 mg total) by mouth daily before breakfast.    ezetimibe (ZETIA) 10 MG tablet Take 1 tablet (10 mg total) by mouth daily.    gabapentin (NEURONTIN) 100 MG capsule Take 1 capsule (100 mg total) by mouth 3 (three) times daily.    glucose blood (TRUE METRIX BLOOD GLUCOSE TEST) test strip TEST ONE TIME DAILY AS DIRECTED    Lancets (ONETOUCH DELICA PLUS [id number]) [place] SMARTSIG:1 Topical Daily    Multiple Vitamin (MULTIVITAMIN WITH MINERALS) TABS tablet Take 1 tablet by mouth daily.    naproxen sodium (ALEVE) 220 MG tablet Take 2 tablets by mouth daily as needed. 07/07/2020: Taking ~ 3-4 times weekly (back pain)   rosuvastatin (CRESTOR) 40 MG tablet Take 1 tablet (40 mg total) by mouth daily.    Semaglutide, 1 MG/DOSE, (OZEMPIC, 1 MG/DOSE,) 2 MG/1.5ML SOPN Inject into the skin.    sertraline (ZOLOFT) 50 MG tablet TAKE 1 TABLET DAILY    Vitamin D, Ergocalciferol, (DRISDOL) 1.25 MG (50000 UNIT) CAPS capsule Take 1 capsule (50,000 Units total) by mouth every 7 (seven) days.    [DISCONTINUED] sertraline (ZOLOFT) 50 MG tablet Take 1 tablet (50 mg total) by mouth daily.    No facility-administered encounter medications on file as of 04/19/2022.    PAST MEDICAL HISTORY: Past Medical History:  Diagnosis Date   Anxiety    Arthritis    Asthma    Chickenpox  COPD (chronic obstructive pulmonary disease) ([place])    Depression    Diabetes ([place])    History of blood transfusion    Low back pain     PAST SURGICAL HISTORY: Past Surgical History:  Procedure Laterality Date   APPENDECTOMY  2002   BACK SURGERY  2001, 2002   CHOLECYSTECTOMY  2012   COLONOSCOPY WITH PROPOFOL N/A 04/11/2022   Procedure: COLONOSCOPY WITH PROPOFOL;  Surgeon: Lin Landsman, MD;  Location[facility] ENDOSCOPY;  Service: Gastroenterology;  Laterality: N/A;    ALLERGIES: Allergies  Allergen Reactions   Lipitor [Atorvastatin Calcium]     Severe muscle aches and pain.    Fentanyl Other (See Comments)    Hyperactivity   Metformin And Related Other (See Comments)    Blurry vision    FAMILY HISTORY: Family History  Problem Relation Age of Onset   Arthritis Mother    Lung cancer Mother    Heart disease Mother    Hypertension Mother    Diabetes Mother    Cancer Mother        Lung   Diabetes Sister    Tongue cancer Sister    Alcoholism Father    Stroke Father     SOCIAL HISTORY: Social History   Tobacco Use   Smoking status: Former    Packs/day: 1.50    Years: 22.00    Total pack years: 33.00    Types: Cigarettes    Quit date: 02/20/2011    Years since quitting: 11.1   Smokeless tobacco: Never  Vaping Use   Vaping Use: Never used  Substance Use Topics   Alcohol use: Yes    Alcohol/week: 2.0 standard drinks of alcohol    Types: 2 Cans of beer per week    Comment: occasional   Drug use: No   Social History   Social History Narrative   Not on file    Objective:  Vital Signs:  BP 101/63   Pulse 79   Ht '5\' 7"'$  (1.702 m)   Wt 195 lb 6.4 oz (88.6 kg)   SpO2 99%   BMI 30.60 kg/m   General: No acute distress.  Patient appears well-groomed.   Head:  Normocephalic/atraumatic Eyes:  fundi examined but not visualized Neck: supple Back: No paraspinal tenderness  Neurological Exam: Mental status: alert and oriented to person, place, and time, speech fluent and not dysarthric, language intact.  Cranial nerves: CN I: not tested CN II: pupils equal, round and reactive to light, visual fields intact CN III, IV, VI:  full range of motion, no nystagmus, no ptosis CN V: facial sensation intact. CN VII: upper and lower face symmetric CN VIII: hearing intact CN IX, X: gag intact, uvula midline CN XI: sternocleidomastoid and trapezius muscles intact CN XII: tongue midline  Bulk & Tone: normal, no fasciculations. Motor:  muscle strength 5/5 throughout Deep Tendon Reflexes:  2+ throughout,  toes downgoing. 1-2 beats of clonus at ankles  bilaterally. Sensation:  Pinprick and vibratory sensation intact, including to torso Finger to nose testing:  Without dysmetria.    Gait:  Normal station and stride.  Romberg negative.   Labs and Imaging review: Internal labs: Lab Results  Component Value Date   HGBA1C 9.8 (A) 02/15/2022   Lab Results  Component Value Date   [id number] 488 03/13/2022   Lab Results  Component Value Date   TSH 0.66 03/13/2022   No results found for: "ESRSEDRATE", "POCTSEDRATE"  Vit D (03/13/22): 12.86  Assessment/Plan:  Kathreen Devoid  Kadarrius Yanke. is a 52 y.o. male who presents for evaluation of left torso pain. He has a relevant medical history of DM, HLD, depression, degenerative disc disease s/p surgery x 2 (lumbar spine), former smoker. His neurological examination is essentially normal today and his pain has resolved since 04/04/22. Available diagnostic data is significant for HbA1c of 9.8. The etiology of patient's symptoms is currently unclear. Thoracic radiculopathy, diabetic truncal neuropathy, or post herpic neuralgia are all possible, but given how quickly the symptoms resolved, the etiology would be difficult to determine. Given his normal neurologic exam, patient agreed to monitor symptoms. If there is recurrence or new symptoms, further work up and treatment could be considered.  PLAN: -Will monitor symptoms for recurrence or new symptoms -Can continue gabapentin 200 mg PRN  -Return to clinic as needed  The impression above as well as the plan as outlined below were extensively discussed with the patient who voiced understanding. All questions were answered to their satisfaction.  When available, results of the above investigations and possible further recommendations will be communicated to the patient via telephone/MyChart. Patient to call office if not contacted after expected testing turnaround time.   Total time spent reviewing records, interview, history/exam, documentation, and  coordination of care on day of encounter:  35 min   Thank you for allowing me to participate in patient's care.  If I can answer any additional questions, I would be pleased to do so.  Kai Levins, MD   CC: Brad Haven, MD [address] [place] [address]  CC: Referring provider: Leone Haven, MD [address] [place],  [place] [address]

## 2022-04-11 NOTE — Op Note (Signed)
[facility] Patient Name: Brad Jimenez Procedure Date: 04/11/2022 7:34 AM MRN: [id number] Account #: [id number] Date of Birth: 01-08-1971 Admit Type: Outpatient Age: 52 Room: [facility] ENDO ROOM 4 Gender: Male Note Status: Finalized Instrument Name: Jasper Riling [id number] Procedure:             Colonoscopy Indications:           Screening for colorectal malignant neoplasm, This is                         the patient's first colonoscopy Providers:             Lin Landsman MD, MD Referring MD:          Angela Adam. Caryl Bis (Referring MD) Medicines:             General Anesthesia Complications:         No immediate complications. Estimated blood loss: None. Procedure:             Pre-Anesthesia Assessment:                        - Prior to the procedure, a History and Physical was                         performed, and patient medications and allergies were                         reviewed. The patient is competent. The risks and                         benefits of the procedure and the sedation options and                         risks were discussed with the patient. All questions                         were answered and informed consent was obtained.                         Patient identification and proposed procedure were                         verified by the physician, the nurse, the                         anesthesiologist, the anesthetist and the technician                         in the pre-procedure area in the procedure room in the                         endoscopy suite. Mental Status Examination: alert and                         oriented. Airway Examination: normal oropharyngeal                         airway and neck mobility. Respiratory Examination:  clear to auscultation. CV Examination: normal.                         Prophylactic Antibiotics: The patient does not require                         prophylactic  antibiotics. Prior Anticoagulants: The                         patient has taken no anticoagulant or antiplatelet                         agents. ASA Grade Assessment: III - A patient with                         severe systemic disease. After reviewing the risks and                         benefits, the patient was deemed in satisfactory                         condition to undergo the procedure. The anesthesia                         plan was to use general anesthesia. Immediately prior                         to administration of medications, the patient was                         re-assessed for adequacy to receive sedatives. The                         heart rate, respiratory rate, oxygen saturations,                         blood pressure, adequacy of pulmonary ventilation, and                         response to care were monitored throughout the                         procedure. The physical status of the patient was                         re-assessed after the procedure.                        After obtaining informed consent, the colonoscope was                         passed under direct vision. Throughout the procedure,                         the patient's blood pressure, pulse, and oxygen                         saturations were monitored continuously. The procedure  was aborted. The colonscope was not inserted.                         Medications were given. The colonoscopy was extremely                         difficult due to inadequate bowel prep. The patient                         tolerated the procedure well. The quality of the bowel                         preparation was poor. Findings:      The perianal and digital rectal examinations were normal. Pertinent       negatives include normal sphincter tone and no palpable rectal lesions.      Copious quantities of semi-liquid brown stool was found in the rectum,       precluding visualization.  Procedure aborted Impression:            - Preparation of the colon was poor.                        - Stool in the rectum.                        - No specimens collected. Recommendation:        - Discharge patient to home (with escort).                        - Diabetic (ADA) diet today.                        - Continue present medications.                        - Repeat colonoscopy at next available appointment                         (within 3 months) with 2 day prep because the bowel                         preparation was poor. Diagnosis Code(s):     --- Professional ---                        Z12.11, Encounter for screening for malignant neoplasm                         of colon Dr. Ulyess Mort Lin Landsman MD, MD 04/11/2022 8:06:05 AM This report has been signed electronically. Number of Addenda: 0 Note Initiated On: 04/11/2022 7:34 AM Total Procedure Duration: 0 hours 0 minutes 41 seconds  Estimated Blood Loss:  Estimated blood loss: none.      [place]

## 2022-04-12 ENCOUNTER — Encounter: Payer: Self-pay | Admitting: Gastroenterology

## 2022-04-12 ENCOUNTER — Other Ambulatory Visit: Payer: Self-pay | Admitting: Family Medicine

## 2022-04-19 ENCOUNTER — Ambulatory Visit (INDEPENDENT_AMBULATORY_CARE_PROVIDER_SITE_OTHER): Payer: Medicare HMO | Admitting: Neurology

## 2022-04-19 ENCOUNTER — Encounter: Payer: Self-pay | Admitting: Neurology

## 2022-04-19 VITALS — BP 101/63 | HR 79 | Ht 67.0 in | Wt 195.4 lb

## 2022-04-19 DIAGNOSIS — R209 Unspecified disturbances of skin sensation: Secondary | ICD-10-CM | POA: Diagnosis not present

## 2022-04-19 DIAGNOSIS — E119 Type 2 diabetes mellitus without complications: Secondary | ICD-10-CM | POA: Diagnosis not present

## 2022-04-19 DIAGNOSIS — M546 Pain in thoracic spine: Secondary | ICD-10-CM | POA: Diagnosis not present

## 2022-04-19 DIAGNOSIS — M47816 Spondylosis without myelopathy or radiculopathy, lumbar region: Secondary | ICD-10-CM

## 2022-04-19 NOTE — Patient Instructions (Signed)
Please give me a call if you have further symptoms. I would not recommend further work up or treatment today given your symptoms have resolved.  The physicians and staff at [facility] are committed to providing excellent care. You may receive a survey requesting feedback about your experience at our office. We strive to receive "very good" responses to the survey questions. If you feel that your experience would prevent you from giving the office a "very good " response, please contact our office to try to remedy the situation. We may be reached at [phone]. Thank you for taking the time out of your busy day to complete the survey.  Kai Levins, MD [facility]

## 2022-04-24 ENCOUNTER — Telehealth: Payer: Self-pay

## 2022-04-24 NOTE — Telephone Encounter (Signed)
Patient was called to inform him his Ozempic has arrived and is available for pickup, I got a busy signal.  I also sent a mychart message.   Lennex Pietila,cma

## 2022-04-27 ENCOUNTER — Telehealth: Payer: Self-pay

## 2022-04-27 MED ORDER — SEMAGLUTIDE (2 MG/DOSE) 8 MG/3ML ~~LOC~~ SOPN: 2.0000 mg | PEN_INJECTOR | SUBCUTANEOUS | 2 refills | Status: DC

## 2022-04-27 NOTE — Telephone Encounter (Signed)
Brad Jimenez - I've instructed him to call Brad Jimenez. In the meantime, can you complete this form again and make sure you fill in "120 day supply" as the quantity for Ozempic 2 mg?  Thanks!  https://www.[employer].com/content/dam/[email or web address]/forms/PAP_Refill_Request_EN.pdf

## 2022-04-27 NOTE — Addendum Note (Signed)
Addended by: Kaylyn Layer T on: 04/27/2022 02:32 PM   Modules accepted: Orders

## 2022-04-27 NOTE — Telephone Encounter (Signed)
Patient picked up [place] he only received one box (1 month). Patient usually gets a 68msupply. Has this changed because of the increase in dosage? Please call him.

## 2022-04-27 NOTE — Telephone Encounter (Signed)
Pt came into pick up pt assistance medication Ozempic (1 box) @ 1:25pm

## 2022-04-30 ENCOUNTER — Ambulatory Visit (INDEPENDENT_AMBULATORY_CARE_PROVIDER_SITE_OTHER): Payer: Medicare HMO

## 2022-04-30 DIAGNOSIS — E538 Deficiency of other specified B group vitamins: Secondary | ICD-10-CM | POA: Diagnosis not present

## 2022-04-30 MED ORDER — CYANOCOBALAMIN 1000 MCG/ML IJ SOLN
1000.0000 ug | Freq: Once | INTRAMUSCULAR | Status: AC
  Administered 2022-04-30: 1000 ug via INTRAMUSCULAR

## 2022-04-30 NOTE — Progress Notes (Signed)
Patient presented for B 12 injection to left deltoid, patient voiced no concerns nor showed any signs of distress during injection. 

## 2022-05-03 NOTE — Telephone Encounter (Signed)
LVM for patient to call back.   Jereld Presti,cma  

## 2022-05-04 NOTE — Telephone Encounter (Signed)
Form filled and sent to provider for signature it is in the sign basket.  Shemika Robbs,cma

## 2022-05-07 ENCOUNTER — Other Ambulatory Visit: Payer: Self-pay | Admitting: Family Medicine

## 2022-05-07 DIAGNOSIS — E559 Vitamin D deficiency, unspecified: Secondary | ICD-10-CM

## 2022-05-07 NOTE — Telephone Encounter (Signed)
Signed. Please fax.

## 2022-05-07 NOTE — Telephone Encounter (Signed)
Faxed the form to [employer] and confirmation was given.  Natash Berman,cma

## 2022-05-09 ENCOUNTER — Ambulatory Visit: Payer: Medicare HMO | Admitting: Neurology

## 2022-05-17 ENCOUNTER — Telehealth: Payer: Self-pay | Admitting: *Deleted

## 2022-05-17 NOTE — Telephone Encounter (Signed)
Patient notified via mychart that Patient Assistance Medication are in the office & are ready for pick up.   Medication: Ozempic 7m/3mL  Quantity: 4 boxes  Lot# [id number] Exp: 08/30/24

## 2022-05-18 ENCOUNTER — Other Ambulatory Visit (INDEPENDENT_AMBULATORY_CARE_PROVIDER_SITE_OTHER): Payer: Medicare HMO

## 2022-05-18 DIAGNOSIS — E538 Deficiency of other specified B group vitamins: Secondary | ICD-10-CM

## 2022-05-18 DIAGNOSIS — E559 Vitamin D deficiency, unspecified: Secondary | ICD-10-CM

## 2022-05-18 LAB — VITAMIN D 25 HYDROXY (VIT D DEFICIENCY, FRACTURES): VITD: 19.67 ng/mL — ABNORMAL LOW (ref 30.00–100.00)

## 2022-05-21 ENCOUNTER — Telehealth: Payer: Self-pay

## 2022-05-21 ENCOUNTER — Other Ambulatory Visit: Payer: Self-pay | Admitting: Family Medicine

## 2022-05-21 DIAGNOSIS — E559 Vitamin D deficiency, unspecified: Secondary | ICD-10-CM

## 2022-05-21 LAB — INTRINSIC FACTOR ANTIBODIES: Intrinsic Factor: NEGATIVE

## 2022-05-21 MED ORDER — VITAMIN D (ERGOCALCIFEROL) 1.25 MG (50000 UNIT) PO CAPS: 50000.0000 [IU] | ORAL_CAPSULE | ORAL | 0 refills | Status: DC

## 2022-05-21 NOTE — Telephone Encounter (Signed)
Left message to call the office back.

## 2022-05-22 NOTE — Telephone Encounter (Signed)
Pt picked up medication.

## 2022-05-25 ENCOUNTER — Telehealth: Payer: Self-pay

## 2022-05-25 NOTE — Telephone Encounter (Signed)
Left message to call back regarding the 1 box of Ozempic 1 mg we received for the Patient. Inject 2 mL into the skin once a week.

## 2022-06-11 ENCOUNTER — Ambulatory Visit: Payer: Medicare HMO | Admitting: Family Medicine

## 2022-06-27 ENCOUNTER — Telehealth: Payer: Self-pay

## 2022-06-27 NOTE — Telephone Encounter (Signed)
I called the patient and LVM informing him that the [place] had arrived here at the office and it can be picked up today before 4 pm.  Vester Balthazor,cma

## 2022-06-28 NOTE — Telephone Encounter (Signed)
Ozempic has been picked up. 

## 2022-07-04 ENCOUNTER — Other Ambulatory Visit: Payer: Self-pay

## 2022-07-04 DIAGNOSIS — Z122 Encounter for screening for malignant neoplasm of respiratory organs: Secondary | ICD-10-CM

## 2022-07-04 DIAGNOSIS — Z87891 Personal history of nicotine dependence: Secondary | ICD-10-CM

## 2022-07-18 ENCOUNTER — Other Ambulatory Visit: Payer: Self-pay | Admitting: Family Medicine

## 2022-07-18 DIAGNOSIS — E559 Vitamin D deficiency, unspecified: Secondary | ICD-10-CM

## 2022-07-23 ENCOUNTER — Ambulatory Visit: Payer: Medicare HMO | Admitting: Family Medicine

## 2022-07-26 ENCOUNTER — Other Ambulatory Visit: Payer: Self-pay | Admitting: Family Medicine

## 2022-07-26 DIAGNOSIS — E785 Hyperlipidemia, unspecified: Secondary | ICD-10-CM

## 2022-07-31 ENCOUNTER — Ambulatory Visit (INDEPENDENT_AMBULATORY_CARE_PROVIDER_SITE_OTHER): Payer: Medicare HMO | Admitting: Acute Care

## 2022-07-31 ENCOUNTER — Encounter: Payer: Self-pay | Admitting: Acute Care

## 2022-07-31 DIAGNOSIS — Z87891 Personal history of nicotine dependence: Secondary | ICD-10-CM | POA: Diagnosis not present

## 2022-07-31 NOTE — Progress Notes (Signed)
Virtual Visit via Telephone Note  I connected with Brad Jimenez. on 07/31/22 at  2:00 PM EDT by telephone and verified that I am speaking with the correct person using two identifiers.  Location: Patient:  At home Provider:  [address] [place], [place], [address]    I discussed the limitations, risks, security and privacy concerns of performing an evaluation and management service by telephone and the availability of in person appointments. I also discussed with the patient that there may be a patient responsible charge related to this service. The patient expressed understanding and agreed to proceed.   Shared Decision Making Visit Lung Cancer Screening Program [phone])   Eligibility: Age 52 y.o. Pack Years Smoking History Calculation 36 pack year smoking history (# packs/per year x # years smoked) Recent History of coughing up blood  no Unexplained weight loss? no ( >Than 15 pounds within the last 6 months ) Prior History Lung / other cancer no (Diagnosis within the last 5 years already requiring surveillance chest CT Scans). Smoking Status Former Smoker Former Smokers: Years since quit: 11 years  Quit Date: 02/2011  Visit Components: Discussion included one or more decision making aids. yes Discussion included risk/benefits of screening. yes Discussion included potential follow up diagnostic testing for abnormal scans. yes Discussion included meaning and risk of over diagnosis. yes Discussion included meaning and risk of False Positives. yes Discussion included meaning of total radiation exposure. yes  Counseling Included: Importance of adherence to annual lung cancer LDCT screening. yes Impact of comorbidities on ability to participate in the program. yes Ability and willingness to under diagnostic treatment. yes  Smoking Cessation Counseling: Current Smokers:  Discussed importance of smoking cessation. yes Information about tobacco cessation classes and  interventions provided to patient. yes Patient provided with "ticket" for LDCT Scan. yes Symptomatic Patient. no  Counseling NA Diagnosis Code: Tobacco Use Z72.0 Asymptomatic Patient yes  Counseling (Intermediate counseling: > three minutes counseling) [id number] Former Smokers:  Discussed the importance of maintaining cigarette abstinence. yes Diagnosis Code: Personal History of Nicotine Dependence. W29.562 Information about tobacco cessation classes and interventions provided to patient. Yes Patient provided with "ticket" for LDCT Scan. yes Written Order for Lung Cancer Screening with LDCT placed in Epic. Yes (CT Chest Lung Cancer Screening Low Dose W/O CM) [id number] Z12.2-Screening of respiratory organs Z87.891-Personal history of nicotine dependence  I spent 25 minutes of face to face time/virtual visit time  with  Brad Jimenez discussing the risks and benefits of lung cancer screening. We took the time to pause the power point at intervals to allow for questions to be asked and answered to ensure understanding. We discussed that he had taken the single most powerful action possible to decrease his risk of developing lung cancer when he quit smoking. I counseled him to remain smoke free, and to contact me if he ever had the desire to smoke again so that I can provide resources and tools to help support the effort to remain smoke free. We discussed the time and location of the scan, and that either  Abigail Miyamoto RN, Karlton Lemon, RN or I  or I will call / send a letter with the results within  24-72 hours of receiving them. He has the office contact information in the event he needs to speak with me,  he verbalized understanding of all of the above and had no further questions upon leaving the office.     I explained to the patient that  there has been a high incidence of coronary artery disease noted on these exams. I explained that this is a non-gated exam therefore degree or severity cannot be  determined. This patient is on statin therapy. I have asked the patient to follow-up with their PCP regarding any incidental finding of coronary artery disease and management with diet or medication as they feel is clinically indicated. The patient verbalized understanding of the above and had no further questions.      Bevelyn Ngo, NP 07/31/2022

## 2022-07-31 NOTE — Patient Instructions (Signed)
Thank you for participating in the [facility] Cancer Screening Program. It was our pleasure to meet you today. We will call you with the results of your scan within the next few days. Your scan will be assigned a Lung RADS category score by the physicians reading the scans.  This Lung RADS score determines follow up scanning.  See below for description of categories, and follow up screening recommendations. We will be in touch to schedule your follow up screening annually or based on recommendations of our providers. We will fax a copy of your scan results to your Primary Care Physician, or the physician who referred you to the program, to ensure they have the results. Please call the office if you have any questions or concerns regarding your scanning experience or results.  Our office number is [phone]. Please speak with Denise Phelps, RN. , or  Denise Buckner RN, They are  our Lung Cancer Screening RN.'s If They are unavailable when you call, Please leave a message on the voice mail. We will return your call at our earliest convenience.This voice mail is monitored several times a day.  Remember, if your scan is normal, we will scan you annually as long as you continue to meet the criteria for the program. (Age 50-80, Current smoker or smoker who has quit within the last 15 years). If you are a smoker, remember, quitting is the single most powerful action that you can take to decrease your risk of lung cancer and other pulmonary, breathing related problems. We know quitting is hard, and we are here to help.  Please let us know if there is anything we can do to help you meet your goal of quitting. If you are a former smoker, congratulations. We are proud of you! Remain smoke free! Remember you can refer friends or family members through the number above.  We will screen them to make sure they meet criteria for the program. Thank you for helping us take better care of you by  participating in Lung Screening.  You can receive free nicotine replacement therapy ( patches, gum or mints) by calling 1-800-QUIT NOW. Please call so we can get you on the path to becoming  a non-smoker. I know it is hard, but you can do this!  Lung RADS Categories:  Lung RADS 1: no nodules or definitely non-concerning nodules.  Recommendation is for a repeat annual scan in 12 months.  Lung RADS 2:  nodules that are non-concerning in appearance and behavior with a very low likelihood of becoming an active cancer. Recommendation is for a repeat annual scan in 12 months.  Lung RADS 3: nodules that are probably non-concerning , includes nodules with a low likelihood of becoming an active cancer.  Recommendation is for a 6-month repeat screening scan. Often noted after an upper respiratory illness. We will be in touch to make sure you have no questions, and to schedule your 6-month scan.  Lung RADS 4 A: nodules with concerning findings, recommendation is most often for a follow up scan in 3 months or additional testing based on our provider's assessment of the scan. We will be in touch to make sure you have no questions and to schedule the recommended 3 month follow up scan.  Lung RADS 4 B:  indicates findings that are concerning. We will be in touch with you to schedule additional diagnostic testing based on our provider's  assessment of the scan.  Other options for assistance in smoking cessation (   As covered by your insurance benefits)  Hypnosis for smoking cessation  [employer] [phone]  Acupuncture for smoking cessation  [facility] [phone]   

## 2022-08-01 ENCOUNTER — Ambulatory Visit
Admission: RE | Admit: 2022-08-01 | Discharge: 2022-08-01 | Disposition: A | Discharge status: Home / Self Care | Payer: Medicare HMO | Source: Ambulatory Visit | Attending: Family Medicine | Admitting: Family Medicine

## 2022-08-01 DIAGNOSIS — Z122 Encounter for screening for malignant neoplasm of respiratory organs: Secondary | ICD-10-CM | POA: Insufficient documentation

## 2022-08-01 DIAGNOSIS — Z87891 Personal history of nicotine dependence: Secondary | ICD-10-CM | POA: Insufficient documentation

## 2022-08-06 ENCOUNTER — Other Ambulatory Visit: Payer: Self-pay | Admitting: Acute Care

## 2022-08-06 DIAGNOSIS — Z87891 Personal history of nicotine dependence: Secondary | ICD-10-CM

## 2022-08-06 DIAGNOSIS — Z122 Encounter for screening for malignant neoplasm of respiratory organs: Secondary | ICD-10-CM

## 2022-08-16 ENCOUNTER — Other Ambulatory Visit: Payer: Medicare HMO

## 2022-08-23 ENCOUNTER — Other Ambulatory Visit: Payer: Medicare HMO

## 2022-09-03 ENCOUNTER — Encounter: Payer: Self-pay | Admitting: Family Medicine

## 2022-09-03 ENCOUNTER — Ambulatory Visit (INDEPENDENT_AMBULATORY_CARE_PROVIDER_SITE_OTHER): Payer: Medicare HMO | Admitting: Family Medicine

## 2022-09-03 VITALS — BP 126/82 | HR 75 | Temp 97.9°F | Ht 67.0 in | Wt 197.0 lb

## 2022-09-03 DIAGNOSIS — M545 Low back pain, unspecified: Secondary | ICD-10-CM | POA: Diagnosis not present

## 2022-09-03 DIAGNOSIS — E119 Type 2 diabetes mellitus without complications: Secondary | ICD-10-CM | POA: Diagnosis not present

## 2022-09-03 DIAGNOSIS — H9191 Unspecified hearing loss, right ear: Secondary | ICD-10-CM | POA: Diagnosis not present

## 2022-09-03 DIAGNOSIS — E559 Vitamin D deficiency, unspecified: Secondary | ICD-10-CM | POA: Insufficient documentation

## 2022-09-03 DIAGNOSIS — E785 Hyperlipidemia, unspecified: Secondary | ICD-10-CM

## 2022-09-03 DIAGNOSIS — G8929 Other chronic pain: Secondary | ICD-10-CM

## 2022-09-03 DIAGNOSIS — E538 Deficiency of other specified B group vitamins: Secondary | ICD-10-CM | POA: Diagnosis not present

## 2022-09-03 MED ORDER — ROSUVASTATIN CALCIUM 40 MG PO TABS: 40.0000 mg | ORAL_TABLET | Freq: Every day | ORAL | 1 refills | Status: DC

## 2022-09-03 NOTE — Patient Instructions (Signed)
Nice to see you. ENT should call you in the next 2 weeks to set up an appointment. If you do not hear from them by then please let me know.

## 2022-09-03 NOTE — Assessment & Plan Note (Signed)
Chronic issue.  Refer to ENT.  Patient will let me know what eardrops he still has at home so we can try those for the mild erythema in his ear canal.

## 2022-09-03 NOTE — Progress Notes (Signed)
Marikay Alar, MD Phone: [phone]  Brad Jimenez. is a 52 y.o. male who presents today for f/u.  DIABETES Disease Monitoring: Blood Sugar ranges-140-160 Polyuria/phagia/dipsia- no      Optho- UTD Medications: Compliance- taking ozempic 2 mg weekly, has only been on this dose for 1 month, also on farxiga Hypoglycemic symptoms- no  Chronic low back pain: Patient notes this is good at this time.  He stays moving and does not do things that will irritate it.  No numbness or weakness.  Has chronic tingling in his toes that occur occasionally.  Right ear reduced hearing: Patient has this has been going on for a while.  He went to have his hearing checked and notes they were going to charge him $400 for this.  Over the last month or so he feels popping and fluid in his ear.  He was previously treated with a couple of antibiotic eardrops with little benefit.   Social History   Tobacco Use  Smoking Status Former   Packs/day: 1.50   Years: 24.00   Additional pack years: 0.00   Total pack years: 36.00   Types: Cigarettes   Quit date: 02/20/2011   Years since quitting: 11.5  Smokeless Tobacco Never    Current Outpatient Medications on File Prior to Visit  Medication Sig Dispense Refill   blood glucose meter kit and supplies KIT Dispense based on patient and insurance preference. Use once daily as directed. (FOR ICD-10 E11.9). 1 each 0   dapagliflozin propanediol (FARXIGA) 10 MG TABS tablet Take 1 tablet (10 mg total) by mouth daily before breakfast. 90 tablet 3   ezetimibe (ZETIA) 10 MG tablet TAKE 1 TABLET DAILY 90 tablet 1   gabapentin (NEURONTIN) 100 MG capsule Take 1 capsule (100 mg total) by mouth 3 (three) times daily. 90 capsule 3   glucose blood (TRUE METRIX BLOOD GLUCOSE TEST) test strip TEST ONE TIME DAILY AS DIRECTED 100 strip 3   Lancets (ONETOUCH DELICA PLUS LANCET33G) MISC SMARTSIG:1 Topical Daily     Multiple Vitamin (MULTIVITAMIN WITH MINERALS) TABS tablet Take 1  tablet by mouth daily.     naproxen sodium (ALEVE) 220 MG tablet Take 2 tablets by mouth daily as needed.     Semaglutide, 2 MG/DOSE, 8 MG/3ML SOPN Inject 2 mg as directed once a week. 12 mL 2   sertraline (ZOLOFT) 50 MG tablet TAKE 1 TABLET DAILY 90 tablet 3   Vitamin D, Ergocalciferol, (DRISDOL) 1.25 MG (50000 UNIT) CAPS capsule TAKE 1 CAPSULE (50,000 UNITS TOTAL) BY MOUTH EVERY 7 (SEVEN) DAYS 8 capsule 0   No current facility-administered medications on file prior to visit.     ROS see history of present illness  Objective  Physical Exam Vitals:   09/03/22 1405  BP: 126/82  Pulse: 75  Temp: 97.9 F (36.6 C)  SpO2: 98%    BP Readings from Last 3 Encounters:  09/03/22 126/82  04/19/22 101/63  04/11/22 129/85   Wt Readings from Last 3 Encounters:  09/03/22 197 lb (89.4 kg)  08/01/22 192 lb (87.1 kg)  04/19/22 195 lb 6.4 oz (88.6 kg)    Physical Exam Constitutional:      General: He is not in acute distress.    Appearance: He is not diaphoretic.  HENT:     Ears:      Comments: Right ear canal with mild erythema, normal left TM, normal left ear canal Cardiovascular:     Rate and Rhythm: Normal rate and regular  rhythm.     Heart sounds: Normal heart sounds.  Pulmonary:     Effort: Pulmonary effort is normal.     Breath sounds: Normal breath sounds.  Skin:    General: Skin is warm and dry.  Neurological:     Mental Status: He is alert.      Assessment/Plan: Please see individual problem list.  Type 2 diabetes mellitus without complication, without long-term current use of insulin (HCC) Assessment & Plan: Chronic issue.  Check A1c.  Check urine microalbumin creatinine ratio.  Patient will continue Ozempic 2 mg weekly and Farxiga 10 mg daily.  Orders: -     Microalbumin / creatinine urine ratio -     Hemoglobin A1c  Hyperlipidemia, unspecified hyperlipidemia type -     Rosuvastatin Calcium; Take 1 tablet (40 mg total) by mouth daily.  Dispense: 90 tablet;  Refill: 1  Chronic left-sided low back pain without sciatica Assessment & Plan: Chronic issue.  Adequately controlled.  Patient will monitor.   Vitamin D deficiency Assessment & Plan: Check vitamin D today.  Orders: -     VITAMIN D 25 Hydroxy (Vit-D Deficiency, Fractures)  Low serum vitamin B12 Assessment & Plan: Recheck B12 today.  Orders: -     Vitamin B12  Hearing loss of right ear, unspecified hearing loss type Assessment & Plan: Chronic issue.  Refer to ENT.  Patient will let me know what eardrops he still has at home so we can try those for the mild erythema in his ear canal.  Orders: -     Ambulatory referral to ENT    Return in about 3 months (around 12/04/2022) for dm.   Marikay Alar, MD [facility]

## 2022-09-03 NOTE — Assessment & Plan Note (Signed)
Recheck B12 today. 

## 2022-09-03 NOTE — Assessment & Plan Note (Signed)
Chronic issue.  Check A1c.  Check urine microalbumin creatinine ratio.  Patient will continue Ozempic 2 mg weekly and Farxiga 10 mg daily.

## 2022-09-03 NOTE — Assessment & Plan Note (Signed)
Chronic issue.  Adequately controlled.  Patient will monitor.

## 2022-09-03 NOTE — Assessment & Plan Note (Signed)
Check vitamin D today. 

## 2022-09-04 LAB — VITAMIN D 25 HYDROXY (VIT D DEFICIENCY, FRACTURES): VITD: 49.24 ng/mL (ref 30.00–100.00)

## 2022-09-04 LAB — VITAMIN B12: Vitamin B-12: 291 pg/mL (ref 211–911)

## 2022-09-04 LAB — HEMOGLOBIN A1C: Hgb A1c MFr Bld: 7.2 % — ABNORMAL HIGH (ref 4.6–6.5)

## 2022-09-04 LAB — MICROALBUMIN / CREATININE URINE RATIO
Creatinine,U: 78.9 mg/dL
Microalb Creat Ratio: 1.3 mg/g (ref 0.0–30.0)
Microalb, Ur: 1 mg/dL (ref 0.0–1.9)

## 2022-09-19 DIAGNOSIS — H903 Sensorineural hearing loss, bilateral: Secondary | ICD-10-CM | POA: Diagnosis not present

## 2022-09-19 DIAGNOSIS — H6121 Impacted cerumen, right ear: Secondary | ICD-10-CM | POA: Diagnosis not present

## 2022-09-26 ENCOUNTER — Telehealth: Payer: Self-pay

## 2022-09-26 NOTE — Telephone Encounter (Signed)
Left message for Patient to call back regarding his Patient Assistant Ozempic is in and ready for pick up. 4 boxes of Ozempic.

## 2022-09-26 NOTE — Telephone Encounter (Signed)
noted 

## 2022-09-26 NOTE — Telephone Encounter (Signed)
Pt called back and I read the message to him and he will come by today and pick up medication

## 2022-09-26 NOTE — Telephone Encounter (Signed)
Medication has been picked up.  

## 2022-11-15 ENCOUNTER — Encounter (INDEPENDENT_AMBULATORY_CARE_PROVIDER_SITE_OTHER): Payer: Self-pay

## 2022-11-21 ENCOUNTER — Other Ambulatory Visit: Payer: Self-pay | Admitting: Family Medicine

## 2022-11-21 DIAGNOSIS — E785 Hyperlipidemia, unspecified: Secondary | ICD-10-CM

## 2022-12-05 ENCOUNTER — Encounter: Payer: Self-pay | Admitting: Family Medicine

## 2022-12-05 ENCOUNTER — Ambulatory Visit: Payer: Medicare HMO | Admitting: Family Medicine

## 2022-12-05 VITALS — BP 122/76 | HR 72 | Temp 97.8°F | Ht 67.0 in | Wt 201.4 lb

## 2022-12-05 DIAGNOSIS — E559 Vitamin D deficiency, unspecified: Secondary | ICD-10-CM

## 2022-12-05 DIAGNOSIS — E7800 Pure hypercholesterolemia, unspecified: Secondary | ICD-10-CM

## 2022-12-05 DIAGNOSIS — E119 Type 2 diabetes mellitus without complications: Secondary | ICD-10-CM

## 2022-12-05 DIAGNOSIS — R252 Cramp and spasm: Secondary | ICD-10-CM | POA: Diagnosis not present

## 2022-12-05 DIAGNOSIS — E538 Deficiency of other specified B group vitamins: Secondary | ICD-10-CM | POA: Diagnosis not present

## 2022-12-05 LAB — LIPID PANEL
Cholesterol: 108 mg/dL (ref 0–200)
HDL: 43.2 mg/dL (ref >39.00)
LDL Cholesterol: 11 mg/dL (ref 0–99)
NonHDL: 65.09
Total CHOL/HDL Ratio: 3
Triglycerides: 268 mg/dL — ABNORMAL HIGH (ref 0.0–149.0)
VLDL: 53.6 mg/dL — ABNORMAL HIGH (ref 0.0–40.0)

## 2022-12-05 LAB — COMPREHENSIVE METABOLIC PANEL
ALT: 29 U/L (ref 0–53)
AST: 23 U/L (ref 0–37)
Albumin: 4.2 g/dL (ref 3.5–5.2)
Alkaline Phosphatase: 80 U/L (ref 39–117)
BUN: 14 mg/dL (ref 6–23)
CO2: 28 meq/L (ref 19–32)
Calcium: 9 mg/dL (ref 8.4–10.5)
Chloride: 102 meq/L (ref 96–112)
Creatinine, Ser: 0.65 mg/dL (ref 0.40–1.50)
GFR: 107.78 mL/min (ref >60.00)
Glucose, Bld: 104 mg/dL — ABNORMAL HIGH (ref 70–99)
Potassium: 3.7 meq/L (ref 3.5–5.1)
Sodium: 137 meq/L (ref 135–145)
Total Bilirubin: 0.4 mg/dL (ref 0.2–1.2)
Total Protein: 7.2 g/dL (ref 6.0–8.3)

## 2022-12-05 LAB — VITAMIN B12: Vitamin B-12: 912 pg/mL — ABNORMAL HIGH (ref 211–911)

## 2022-12-05 LAB — MAGNESIUM: Magnesium: 2.2 mg/dL (ref 1.5–2.5)

## 2022-12-05 LAB — HEMOGLOBIN A1C: Hgb A1c MFr Bld: 7.2 % — ABNORMAL HIGH (ref 4.6–6.5)

## 2022-12-05 LAB — VITAMIN D 25 HYDROXY (VIT D DEFICIENCY, FRACTURES): VITD: 29.2 ng/mL — ABNORMAL LOW (ref 30.00–100.00)

## 2022-12-05 NOTE — Assessment & Plan Note (Signed)
Chronic issue.  Check A1c.  Continue Ozempic 2 mg weekly and Farxiga 10 mg daily.

## 2022-12-05 NOTE — Assessment & Plan Note (Signed)
Chronic issue.  Check labs.  Continue Crestor 40 mg daily and Zetia 10 mg daily.

## 2022-12-05 NOTE — Assessment & Plan Note (Signed)
Check electrolytes.  Encouraged adequate hydration.  Encourage stretching.

## 2022-12-05 NOTE — Progress Notes (Signed)
Marikay Alar, MD Phone: [phone]  Brad Jimenez. is a 52 y.o. male who presents today for f/u.  DIABETES Disease Monitoring: Blood Sugar ranges-150-160 though only checking once a week Polyuria/phagia/dipsia- no      Optho- due Medications: Compliance- taking farxiga, ozempic Hypoglycemic symptoms- no  HYPERLIPIDEMIA Symptoms Chest pain on exertion:  no   Leg claudication:   no Medications: Compliance- taking zetia, crestor Right upper quadrant pain- no  Muscle aches- cramps Lipid Panel     Component Value Date/Time   CHOL 125 09/20/2021 1221   TRIG 191.0 (H) 09/20/2021 1221   HDL 43.10 09/20/2021 1221   CHOLHDL 3 09/20/2021 1221   VLDL 38.2 09/20/2021 1221   LDLCALC 43 09/20/2021 1221   LDLDIRECT 62.0 09/06/2020 1221   Leg cramps: Patient notes these have been going on for a while.  They are not too bad.  They occur daily.  He only drinks 3-4 bottles of water daily.  He does not stretch.  Social History   Tobacco Use  Smoking Status Former   Current packs/day: 0.00   Average packs/day: 1.5 packs/day for 24.0 years (36.0 ttl pk-yrs)   Types: Cigarettes   Start date: 02/20/1987   Quit date: 02/20/2011   Years since quitting: 11.7  Smokeless Tobacco Never    Current Outpatient Medications on File Prior to Visit  Medication Sig Dispense Refill   blood glucose meter kit and supplies KIT Dispense based on patient and insurance preference. Use once daily as directed. (FOR ICD-10 E11.9). 1 each 0   dapagliflozin propanediol (FARXIGA) 10 MG TABS tablet Take 1 tablet (10 mg total) by mouth daily before breakfast. 90 tablet 3   ezetimibe (ZETIA) 10 MG tablet TAKE 1 TABLET DAILY 90 tablet 1   gabapentin (NEURONTIN) 100 MG capsule Take 1 capsule (100 mg total) by mouth 3 (three) times daily. 90 capsule 3   glucose blood (TRUE METRIX BLOOD GLUCOSE TEST) test strip TEST ONE TIME DAILY AS DIRECTED 100 strip 3   Lancets (ONETOUCH DELICA PLUS LANCET33G) MISC SMARTSIG:1  Topical Daily     Multiple Vitamin (MULTIVITAMIN WITH MINERALS) TABS tablet Take 1 tablet by mouth daily.     naproxen sodium (ALEVE) 220 MG tablet Take 2 tablets by mouth daily as needed.     rosuvastatin (CRESTOR) 40 MG tablet Take 1 tablet (40 mg total) by mouth daily. 90 tablet 1   Semaglutide, 2 MG/DOSE, 8 MG/3ML SOPN Inject 2 mg as directed once a week. 12 mL 2   sertraline (ZOLOFT) 50 MG tablet TAKE 1 TABLET DAILY 90 tablet 3   Vitamin D, Ergocalciferol, (DRISDOL) 1.25 MG (50000 UNIT) CAPS capsule TAKE 1 CAPSULE (50,000 UNITS TOTAL) BY MOUTH EVERY 7 (SEVEN) DAYS 8 capsule 0   No current facility-administered medications on file prior to visit.     ROS see history of present illness  Objective  Physical Exam Vitals:   12/05/22 1327 12/05/22 1334  BP: 128/82 122/76  Pulse: 72   Temp: 97.8 F (36.6 C)   SpO2: 97%     BP Readings from Last 3 Encounters:  12/05/22 122/76  09/03/22 126/82  04/19/22 101/63   Wt Readings from Last 3 Encounters:  12/05/22 201 lb 6.4 oz (91.4 kg)  09/03/22 197 lb (89.4 kg)  08/01/22 192 lb (87.1 kg)    Physical Exam Constitutional:      General: He is not in acute distress.    Appearance: He is not diaphoretic.  Cardiovascular:  Rate and Rhythm: Normal rate and regular rhythm.     Heart sounds: Normal heart sounds.  Pulmonary:     Effort: Pulmonary effort is normal.     Breath sounds: Normal breath sounds.  Musculoskeletal:     Right lower leg: No edema.     Left lower leg: No edema.  Skin:    General: Skin is warm and dry.  Neurological:     Mental Status: He is alert.      Assessment/Plan: Please see individual problem list.  Type 2 diabetes mellitus without complication, without long-term current use of insulin (HCC) Assessment & Plan: Chronic issue.  Check A1c.  Continue Ozempic 2 mg weekly and Farxiga 10 mg daily.  Orders: -     Comprehensive metabolic panel -     Hemoglobin A1c  Elevated LDL cholesterol  level Assessment & Plan: Chronic issue.  Check labs.  Continue Crestor 40 mg daily and Zetia 10 mg daily.  Orders: -     Comprehensive metabolic panel -     Lipid panel  Vitamin D deficiency -     VITAMIN D 25 Hydroxy (Vit-D Deficiency, Fractures)  Low serum vitamin B12 -     Vitamin B12  Leg cramps Assessment & Plan: Check electrolytes.  Encouraged adequate hydration.  Encourage stretching.  Orders: -     Comprehensive metabolic panel -     Magnesium     Health Maintenance: Patient was encouraged to see the ophthalmologist for his yearly eye exam given his history of diabetes.  Return in about 3 months (around 03/06/2023).   Marikay Alar, MD [facility]

## 2022-12-10 ENCOUNTER — Telehealth: Payer: Self-pay

## 2022-12-10 NOTE — Telephone Encounter (Signed)
 Left message to call the office back regarding the lab results below.

## 2022-12-10 NOTE — Telephone Encounter (Signed)
-----   Message from Eulis Foster sent at 12/08/2022  2:14 PM EDT ----- Labs stable.  Cholesterol is also stable. Watch intake of baked sweets, cakes, pies, to help lower triglycerides.  Be sure you are taking Vitamin D

## 2022-12-11 ENCOUNTER — Telehealth: Payer: Self-pay

## 2022-12-11 NOTE — Telephone Encounter (Signed)
-----   Message from Eulis Foster sent at 12/08/2022  2:14 PM EDT ----- Labs stable.  Cholesterol is also stable. Watch intake of baked sweets, cakes, pies, to help lower triglycerides.  Be sure you are taking Vitamin D

## 2022-12-11 NOTE — Telephone Encounter (Signed)
Left message to call the office back regarding the lab result below.

## 2023-01-17 DIAGNOSIS — Z7984 Long term (current) use of oral hypoglycemic drugs: Secondary | ICD-10-CM | POA: Diagnosis not present

## 2023-01-17 DIAGNOSIS — Z8249 Family history of ischemic heart disease and other diseases of the circulatory system: Secondary | ICD-10-CM | POA: Diagnosis not present

## 2023-01-17 DIAGNOSIS — F411 Generalized anxiety disorder: Secondary | ICD-10-CM | POA: Diagnosis not present

## 2023-01-17 DIAGNOSIS — E785 Hyperlipidemia, unspecified: Secondary | ICD-10-CM | POA: Diagnosis not present

## 2023-01-17 DIAGNOSIS — Z809 Family history of malignant neoplasm, unspecified: Secondary | ICD-10-CM | POA: Diagnosis not present

## 2023-01-17 DIAGNOSIS — M1990 Unspecified osteoarthritis, unspecified site: Secondary | ICD-10-CM | POA: Diagnosis not present

## 2023-01-17 DIAGNOSIS — E669 Obesity, unspecified: Secondary | ICD-10-CM | POA: Diagnosis not present

## 2023-01-17 DIAGNOSIS — J449 Chronic obstructive pulmonary disease, unspecified: Secondary | ICD-10-CM | POA: Diagnosis not present

## 2023-01-17 DIAGNOSIS — E119 Type 2 diabetes mellitus without complications: Secondary | ICD-10-CM | POA: Diagnosis not present

## 2023-01-17 DIAGNOSIS — Z823 Family history of stroke: Secondary | ICD-10-CM | POA: Diagnosis not present

## 2023-01-17 DIAGNOSIS — Z87891 Personal history of nicotine dependence: Secondary | ICD-10-CM | POA: Diagnosis not present

## 2023-01-17 DIAGNOSIS — F325 Major depressive disorder, single episode, in full remission: Secondary | ICD-10-CM | POA: Diagnosis not present

## 2023-01-24 ENCOUNTER — Telehealth: Payer: Self-pay

## 2023-01-24 NOTE — Telephone Encounter (Signed)
Called and spoke with pt to let him know that his 3 boxes of Ozempic 8mg /2ml was delivered and ready to be picked up. Pt reported that he would pick up medication tomorrow.

## 2023-01-28 NOTE — Telephone Encounter (Signed)
Pt picked up medication.

## 2023-02-15 ENCOUNTER — Telehealth: Payer: Self-pay

## 2023-02-15 NOTE — Telephone Encounter (Signed)
Reaching out to pt via Mychart regarding 2025 re enrollment through [employer] for [employer] and [employer] for [employer]

## 2023-02-22 ENCOUNTER — Other Ambulatory Visit (HOSPITAL_COMMUNITY): Payer: Self-pay

## 2023-02-22 NOTE — Telephone Encounter (Signed)
Pending pt approval of e sign document for [employer], faxed provider portion of application to Dr. Birdie Sons to be signed/dated

## 2023-02-25 ENCOUNTER — Ambulatory Visit (INDEPENDENT_AMBULATORY_CARE_PROVIDER_SITE_OTHER): Payer: Medicare HMO | Admitting: *Deleted

## 2023-02-25 VITALS — Ht 67.0 in | Wt 195.0 lb

## 2023-02-25 DIAGNOSIS — Z0000 Encounter for general adult medical examination without abnormal findings: Secondary | ICD-10-CM

## 2023-02-25 NOTE — Patient Instructions (Signed)
[name] , Thank you for taking time to come for your Medicare Wellness Visit. I appreciate your ongoing commitment to your health goals. Please review the following plan we discussed and let me know if I can assist you in the future.   Referrals/Orders/Follow-Ups/Clinician Recommendations: Consider updating your vaccines. Make sure you keep your eye appointment.  This is a list of the screening recommended for you and due dates:  Health Maintenance  Topic Date Due   Hepatitis C Screening  Never done   Zoster (Shingles) Vaccine (1 of 2) Never done   Eye exam for diabetics  03/06/2022   Flu Shot  Never done   COVID-19 Vaccine (4 - 2023-24 season) 12/02/2022   Complete foot exam   02/16/2023   Hemoglobin A1C  06/04/2023   Screening for Lung Cancer  08/01/2023   Yearly kidney health urinalysis for diabetes  09/03/2023   Yearly kidney function blood test for diabetes  12/05/2023   Medicare Annual Wellness Visit  02/25/2024   DTaP/Tdap/Td vaccine (2 - Td or Tdap) 06/06/2025   Colon Cancer Screening  04/11/2032   HIV Screening  Completed   HPV Vaccine  Aged Out    Advanced directives: (Declined) Advance directive discussed with you today. Even though you declined this today, please call our office should you change your mind, and we can give you the proper paperwork for you to fill out.  Next Medicare Annual Wellness Visit scheduled for next year: Yes 03/02/24 @ 2:40

## 2023-02-25 NOTE — Progress Notes (Signed)
Subjective:   Brad Jimenez. is a 52 y.o. male who presents for Medicare Annual/Subsequent preventive examination.  Visit Complete: Virtual I connected with  Brad Jimenez. on 02/25/23 by a audio enabled telemedicine application and verified that I am speaking with the correct person using two identifiers.  Patient Location: Home  Provider Location: Office/Clinic  I discussed the limitations of evaluation and management by telemedicine. The patient expressed understanding and agreed to proceed.  Vital Signs: Because this visit was a virtual/telehealth visit, some criteria may be missing or patient reported. Any vitals not documented were not able to be obtained and vitals that have been documented are patient reported.   Cardiac Risk Factors include: diabetes mellitus;dyslipidemia;male gender;obesity (BMI >30kg/m2)     Objective:    Today's Vitals   02/25/23 1545  Weight: 195 lb (88.5 kg)   Body mass index is 30.54 kg/m.     02/25/2023    3:57 PM 04/19/2022   10:55 AM 04/11/2022    6:45 AM 03/09/2022    5:00 PM 02/19/2022    1:27 PM 02/08/2021    3:38 PM 09/23/2019   11:38 AM  Advanced Directives  Does Patient Have a Medical Advance Directive? No No No No No No No  Would patient like information on creating a medical advance directive? No - Patient declined    No - Patient declined No - Patient declined No - Patient declined    Current Medications (verified) Outpatient Encounter Medications as of 02/25/2023  Medication Sig   blood glucose meter kit and supplies KIT Dispense based on patient and insurance preference. Use once daily as directed. (FOR ICD-10 E11.9).   Cholecalciferol (VITAMIN D3) 50 MCG (2000 UT) capsule Take 2,000 Units by mouth daily.   cyanocobalamin (VITAMIN B12) 1000 MCG tablet Take 1,000 mcg by mouth daily.   dapagliflozin propanediol (FARXIGA) 10 MG TABS tablet Take 1 tablet (10 mg total) by mouth daily before breakfast.   ezetimibe (ZETIA) 10 MG  tablet TAKE 1 TABLET DAILY   glucose blood (TRUE METRIX BLOOD GLUCOSE TEST) test strip TEST ONE TIME DAILY AS DIRECTED   Lancets (ONETOUCH DELICA PLUS LANCET33G) MISC SMARTSIG:1 Topical Daily   Multiple Vitamin (MULTIVITAMIN WITH MINERALS) TABS tablet Take 1 tablet by mouth daily.   naproxen sodium (ALEVE) 220 MG tablet Take 2 tablets by mouth daily as needed.   rosuvastatin (CRESTOR) 40 MG tablet Take 1 tablet (40 mg total) by mouth daily.   Semaglutide, 2 MG/DOSE, 8 MG/3ML SOPN Inject 2 mg as directed once a week.   sertraline (ZOLOFT) 50 MG tablet TAKE 1 TABLET DAILY   gabapentin (NEURONTIN) 100 MG capsule Take 1 capsule (100 mg total) by mouth 3 (three) times daily. (Patient not taking: Reported on 02/25/2023)   Vitamin D, Ergocalciferol, (DRISDOL) 1.25 MG (50000 UNIT) CAPS capsule TAKE 1 CAPSULE (50,000 UNITS TOTAL) BY MOUTH EVERY 7 (SEVEN) DAYS (Patient not taking: Reported on 02/25/2023)   No facility-administered encounter medications on file as of 02/25/2023.    Allergies (verified) Lipitor [atorvastatin calcium], Fentanyl, and Metformin and related   History: Past Medical History:  Diagnosis Date   Anxiety    Arthritis    Asthma    Chickenpox    COPD (chronic obstructive pulmonary disease) (HCC)    Depression    Diabetes (HCC)    History of blood transfusion    Low back pain    Past Surgical History:  Procedure Laterality Date   APPENDECTOMY  2002  BACK SURGERY  2001, 2002   CHOLECYSTECTOMY  2012   COLONOSCOPY WITH PROPOFOL N/A 04/11/2022   Procedure: COLONOSCOPY WITH PROPOFOL;  Surgeon: Toney Reil, MD;  Location[facility] END[facility]COPY;  Service: Gastroenterology;  Laterality: N/A;   Family History  Problem Relation Age of Onset   Arthritis Mother    Lung cancer Mother    Heart disease Mother    Hypertension Mother    Diabetes Mother    Cancer Mother        Lung   Diabetes Sister    Tongue cancer Sister    Alcoholism Father    Stroke Father    Social  History   Socioeconomic History   Marital status: Married    Spouse name: Not on file   Number of children: Not on file   Years of education: Not on file   Highest education level: Not on file  Occupational History   Not on file  Tobacco Use   Smoking status: Former    Current packs/day: 0.00    Average packs/day: 1.5 packs/day for 24.0 years (36.0 ttl pk-yrs)    Types: Cigarettes    Start date: 02/20/1987    Quit date: 02/20/2011    Years since quitting: 12.0   Smokeless tobacco: Never  Vaping Use   Vaping status: Never Used  Substance and Sexual Activity   Alcohol use: Yes    Alcohol/week: 2.0 standard drinks of alcohol    Types: 2 Cans of beer per week    Comment: occasional   Drug use: No   Sexual activity: Yes  Other Topics Concern   Not on file  Social History Narrative   Are you right handed or left handed? right   Are you currently employed ? no   What is your current occupation? Disabled    Do you live at home alone?no   Who lives with you? wife   What type of home do you live in: 1 story or 2 story? One     Caffeine rarely    Social Determinants of Health   Financial Resource Strain: Low Risk  (02/25/2023)   Overall Financial Resource Strain (CARDIA)    Difficulty of Paying Living Expenses: Not hard at all  Food Insecurity: No Food Insecurity (02/25/2023)   Hunger Vital Sign    Worried About Running Out of Food in the Last Year: Never true    Ran Out of Food in the Last Year: Never true  Transportation Needs: No Transportation Needs (02/25/2023)   PRAPARE - Administrator, Civil Service (Medical): No    Lack of Transportation (Non-Medical): No  Physical Activity: Inactive (02/25/2023)   Exercise Vital Sign    Days of Exercise per Week: 0 days    Minutes of Exercise per Session: 0 min  Stress: No Stress Concern Present (02/25/2023)   [employer] of Occupational Health - Occupational Stress Questionnaire    Feeling of Stress : Not at  all  Social Connections: Moderately Integrated (02/25/2023)   Social Connection and Isolation Panel [NHANES]    Frequency of Communication with Friends and Family: More than three times a week    Frequency of Social Gatherings with Friends and Family: Once a week    Attends Religious Services: More than 4 times per year    Active Member of [employer] or Organizations: No    Attends Banker Meetings: Never    Marital Status: Married    Tobacco Counseling Counseling given: Not  Answered   Clinical Intake:  Pre-visit preparation completed: Yes  Pain : No/denies pain     BMI - recorded: 30.54 Nutritional Status: BMI > 30  Obese Nutritional Risks: None Diabetes: Yes CBG done?: No Did pt. bring in CBG monitor from home?: No  How often do you need to have someone help you when you read instructions, pamphlets, or other written materials from your doctor or pharmacy?: 1 - Never  Interpreter Needed?: No  Information entered by :: R. Yassen Kinnett LPN   Activities of Daily Living    02/25/2023    3:47 PM  In your present state of health, do you have any difficulty performing the following activities:  Hearing? 0  Vision? 0  Comment readers  Difficulty concentrating or making decisions? 0  Walking or climbing stairs? 1  Dressing or bathing? 0  Doing errands, shopping? 0  Preparing Food and eating ? N  Using the Toilet? N  In the past six months, have you accidently leaked urine? N  Do you have problems with loss of bowel control? N  Managing your Medications? N  Managing your Finances? N  Housekeeping or managing your Housekeeping? N    Patient Care Team: Glori Luis, MD as PCP - General (Family Medicine)  Indicate any recent Medical Services you may have received from other than [facility] providers in the past year (date may be approximate).     Assessment:   This is a routine wellness examination for Prentiss.  Hearing/Vision screen Hearing Screening -  Comments:: No issues Vision Screening - Comments:: readers   Goals Addressed             This Visit's Progress    Patient Stated       Wants to lose 15 more pounds       Depression Screen    02/25/2023    3:54 PM 12/05/2022    1:28 PM 09/03/2022    2:13 PM 02/19/2022    1:27 PM 02/19/2022    1:23 PM 02/01/2022   11:22 AM 09/20/2021   12:06 PM  PHQ 2/9 Scores  PHQ - 2 Score 0 1 0 0 0 0 0  PHQ- 9 Score 0 4 0        Fall Risk    02/25/2023    3:48 PM 12/05/2022    1:28 PM 09/03/2022    2:13 PM 04/19/2022   10:54 AM 02/19/2022    1:23 PM  Fall Risk   Falls in the past year? 0 0 0 0 0  Number falls in past yr: 0 0 0 0 0  Injury with Fall? 0 0 0 0 0  Risk for fall due to : No Fall Risks No Fall Risks No Fall Risks  No Fall Risks  Follow up Falls prevention discussed;Falls evaluation completed Falls evaluation completed Falls evaluation completed Falls evaluation completed Falls evaluation completed;Falls prevention discussed    MEDICARE RISK AT HOME: Medicare Risk at Home Any stairs in or around the home?: Yes If so, are there any without handrails?: No Home free of loose throw rugs in walkways, pet beds, electrical cords, etc?: Yes Adequate lighting in your home to reduce risk of falls?: Yes Life alert?: No Use of a cane, walker or w/c?: No Grab bars in the bathroom?: No Shower chair or bench in shower?: No Elevated toilet seat or a handicapped toilet?: Yes      Cognitive Function:    09/17/2017    2:27 PM 09/14/2016  2:47 PM  MMSE - Mini Mental State Exam  Orientation to time 5 5  Orientation to Place 5 5  Registration 3 3  Attention/ Calculation 5 5  Recall 3 3  Language- name 2 objects 2 2  Language- repeat 1 1  Language- follow 3 step command 3 3  Language- read & follow direction 1 1  Write a sentence 1 1  Copy design 1 1  Total score 30 30        02/25/2023    3:57 PM 02/19/2022    1:24 PM 02/08/2021    3:48 PM 09/23/2019   11:41 AM 09/22/2018     1:21 PM  6CIT Screen  What Year? 0 points 0 points 0 points 0 points 0 points  What month? 0 points 0 points 0 points 0 points 0 points  What time? 0 points 0 points 0 points  0 points  Count back from 20 0 points 0 points   0 points  Months in reverse 0 points 0 points 0 points 0 points 0 points  Repeat phrase 0 points 0 points  0 points 0 points  Total Score 0 points 0 points   0 points    Immunizations Immunization History  Administered Date(s) Administered   PFIZER(Purple Top)SARS-COV-2 Vaccination 06/29/2019, 07/22/2019   PNEUMOCOCCAL CONJUGATE-20 09/06/2020   Pfizer Covid-19 Vaccine Bivalent Booster 67yrs & up 04/11/2021   Pneumococcal Polysaccharide-23 06/07/2015   Tdap 06/07/2015    TDAP status: Up to date  Flu Vaccine status: Declined, Education has been provided regarding the importance of this vaccine but patient still declined. Advised may receive this vaccine at local pharmacy or Health Dept. Aware to provide a copy of the vaccination record if obtained from local pharmacy or Health Dept. Verbalized acceptance and understanding.  Pneumococcal vaccine status: Up to date  Covid-19 vaccine status: Information provided on how to obtain vaccines.   Qualifies for Shingles Vaccine? Yes   Zostavax completed No   Shingrix Completed?: No.    Education has been provided regarding the importance of this vaccine. Patient has been advised to call insurance company to determine out of pocket expense if they have not yet received this vaccine. Advised may also receive vaccine at local pharmacy or Health Dept. Verbalized acceptance and understanding.  Screening Tests Health Maintenance  Topic Date Due   Hepatitis C Screening  Never done   Zoster Vaccines- Shingrix (1 of 2) Never done   OPHTHALMOLOGY EXAM  03/06/2022   INFLUENZA VACCINE  Never done   COVID-19 Vaccine (4 - 2023-24 season) 12/02/2022   Medicare Annual Wellness (AWV)  02/20/2023   FOOT EXAM  02/16/2023    HEMOGLOBIN A1C  06/04/2023   Lung Cancer Screening  08/01/2023   Diabetic kidney evaluation - Urine ACR  09/03/2023   Diabetic kidney evaluation - eGFR measurement  12/05/2023   DTaP/Tdap/Td (2 - Td or Tdap) 06/06/2025   Colonoscopy  04/11/2032   HIV Screening  Completed   HPV VACCINES  Aged Out    Health Maintenance  Health Maintenance Due  Topic Date Due   Hepatitis C Screening  Never done   Zoster Vaccines- Shingrix (1 of 2) Never done   OPHTHALMOLOGY EXAM  03/06/2022   INFLUENZA VACCINE  Never done   COVID-19 Vaccine (4 - 2023-24 season) 12/02/2022   Medicare Annual Wellness (AWV)  02/20/2023   FOOT EXAM  02/16/2023   Colorectal cancer screening. Had a colonoscopy but was not able to perform because he was  not cleaned out. Will discuss with PCP at next visit   Lung Cancer Screening: (Low Dose CT Chest recommended if Age 52-80 years, 20 pack-year currently smoking OR have quit w/in 15years.) does qualify.     Additional Screening:  Hepatitis C Screening: does not qualify; Completed NA age  Vision Screening: Recommended annual ophthalmology exams for early detection of glaucoma and other disorders of the eye. Is the patient up to date with their annual eye exam?  No  Who is the provider or what is the name of the office in which the patient attends annual eye exams? [facility] appointment scheduled If pt is not established with a provider, would they like to be referred to a provider to establish care? No .   Dental Screening: Recommended annual dental exams for proper oral hygiene  Diabetic Foot Exam: Diabetic Foot Exam: Overdue, Pt has been advised about the importance in completing this exam. Pt is scheduled for diabetic foot exam on 03/06/23 with PCP.  Community Resource Referral / Chronic Care Management: CRR required this visit?  No   CCM required this visit?  No     Plan:     I have personally reviewed and noted the following in the patient's chart:    Medical and social history Use of alcohol, tobacco or illicit drugs  Current medications and supplements including opioid prescriptions. Patient is not currently taking opioid prescriptions. Functional ability and status Nutritional status Physical activity Advanced directives List of other physicians Hospitalizations, surgeries, and ER visits in previous 12 months Vitals Screenings to include cognitive, depression, and falls Referrals and appointments  In addition, I have reviewed and discussed with patient certain preventive protocols, quality metrics, and best practice recommendations. A written personalized care plan for preventive services as well as general preventive health recommendations were provided to patient.     Sydell Axon, LPN   [id number]/01/[id number]   After Visit Summary: (MyChart) Due to this being a telephonic visit, the after visit summary with patients personalized plan was offered to patient via MyChart   Nurse Notes: None

## 2023-02-27 ENCOUNTER — Telehealth: Payer: Self-pay

## 2023-02-27 NOTE — Telephone Encounter (Signed)
Candilyn called from [employer] to state they received a patient assistance application for patient and they are missing the healthcare providers portion of the application.  Candilyn states she would like for [place] to please fax the missing information to [phone].  Candilyn states patient's id number is [id number].

## 2023-03-05 ENCOUNTER — Other Ambulatory Visit: Payer: Self-pay | Admitting: Family Medicine

## 2023-03-05 DIAGNOSIS — E785 Hyperlipidemia, unspecified: Secondary | ICD-10-CM

## 2023-03-06 ENCOUNTER — Ambulatory Visit: Payer: Medicare HMO | Admitting: Family Medicine

## 2023-03-07 NOTE — Telephone Encounter (Signed)
Pt came by the office to drop off patient assistance application. Place in provider folder

## 2023-03-08 NOTE — Telephone Encounter (Signed)
Signed. Placed in signed folder.

## 2023-03-11 ENCOUNTER — Ambulatory Visit (INDEPENDENT_AMBULATORY_CARE_PROVIDER_SITE_OTHER): Payer: Medicare HMO | Admitting: Family Medicine

## 2023-03-11 ENCOUNTER — Encounter: Payer: Self-pay | Admitting: Family Medicine

## 2023-03-11 VITALS — BP 118/70 | HR 82 | Temp 97.7°F | Ht 67.0 in | Wt 203.4 lb

## 2023-03-11 DIAGNOSIS — M545 Low back pain, unspecified: Secondary | ICD-10-CM | POA: Diagnosis not present

## 2023-03-11 DIAGNOSIS — E119 Type 2 diabetes mellitus without complications: Secondary | ICD-10-CM

## 2023-03-11 DIAGNOSIS — Z7984 Long term (current) use of oral hypoglycemic drugs: Secondary | ICD-10-CM | POA: Diagnosis not present

## 2023-03-11 DIAGNOSIS — G4733 Obstructive sleep apnea (adult) (pediatric): Secondary | ICD-10-CM | POA: Diagnosis not present

## 2023-03-11 DIAGNOSIS — G8929 Other chronic pain: Secondary | ICD-10-CM | POA: Diagnosis not present

## 2023-03-11 DIAGNOSIS — Z1211 Encounter for screening for malignant neoplasm of colon: Secondary | ICD-10-CM | POA: Diagnosis not present

## 2023-03-11 DIAGNOSIS — Z7985 Long-term (current) use of injectable non-insulin antidiabetic drugs: Secondary | ICD-10-CM | POA: Diagnosis not present

## 2023-03-11 LAB — POCT GLYCOSYLATED HEMOGLOBIN (HGB A1C)
HbA1c POC (<> result, manual entry): 7.5 % (ref 4.0–5.6)
HbA1c, POC (controlled diabetic range): 7.5 % — AB (ref 0.0–7.0)
HbA1c, POC (prediabetic range): 7.5 % — AB (ref 5.7–6.4)
Hemoglobin A1C: 7.5 % — AB (ref 4.0–5.6)

## 2023-03-11 NOTE — Assessment & Plan Note (Signed)
Chronic issue.  Mild on prior testing.  Patient declined treatment.  He appears to be asymptomatic.  Discussed the risk of cardiovascular issues and death with untreated sleep apnea.  He will monitor for any symptoms and he will let us know if they occur.

## 2023-03-11 NOTE — Assessment & Plan Note (Addendum)
Chronic issue.  A1c is up from previously.  Patient attributes this to dietary indiscretions.  Discussed the option of working on diet, changing Ozempic to Teller, or starting insulin.  Patient opted to work on diet for this.  Continue Ozempic 2 mg weekly and Farxiga 10 mg daily.  Patient will need A1c rechecked in a few months when he establishes care with his new provider.

## 2023-03-11 NOTE — Assessment & Plan Note (Signed)
Chronic issue.  Generally stable.  Patient brings in paperwork to be filled out for insurance premium coverage.  Discussed this and this would be filled out by tomorrow.  If he does not get a call to pick this up tomorrow afternoon he will contact us.

## 2023-03-11 NOTE — Progress Notes (Signed)
Brad Alar, MD Phone: [phone]  Brad Jimenez. is a 52 y.o. male who presents today for f/u.  DIABETES Disease Monitoring: Blood Sugar ranges-140 Polyuria/phagia/dipsia- no      Optho- scheduled this week Medications: Compliance- taking farxiga, ozempic Hypoglycemic symptoms- no  OSA: Patient had a sleep study that revealed mild OSA.  He ended up not getting CPAP as his wife had numerous sleep studies that revealed sleep apnea on 2 of them and revealed normal findings on the other 2 and he felt as though he may not have sleep apnea.  He notes no hypersomnia.  Generally he is well rested in the morning.  Chronic low back pain: Patient has chronic back pain that has kept him out of work for quite some time.  He is on Social Security disability.  His maximum lift 15 pounds.  He notes his pain is a 3-4 all the time and if he does something to strain his back he will be an 8-9 out of 10.  He notes simple movements such as washing the dishes or folding close can cause his back to flare.  He notes no numbness or weakness in his legs.  Does occasionally have sciatica.  Last sciatic episode was 2 nights ago and it resolved on its own.   Social History   Tobacco Use  Smoking Status Former   Current packs/day: 0.00   Average packs/day: 1.5 packs/day for 24.0 years (36.0 ttl pk-yrs)   Types: Cigarettes   Start date: 02/20/1987   Quit date: 02/20/2011   Years since quitting: 12.0  Smokeless Tobacco Never    Current Outpatient Medications on File Prior to Visit  Medication Sig Dispense Refill   blood glucose meter kit and supplies KIT Dispense based on patient and insurance preference. Use once daily as directed. (FOR ICD-10 E11.9). 1 each 0   Cholecalciferol (VITAMIN D3) 50 MCG (2000 UT) capsule Take 2,000 Units by mouth daily.     cyanocobalamin (VITAMIN B12) 1000 MCG tablet Take 1,000 mcg by mouth daily.     dapagliflozin propanediol (FARXIGA) 10 MG TABS tablet Take 1 tablet (10  mg total) by mouth daily before breakfast. 90 tablet 3   ezetimibe (ZETIA) 10 MG tablet TAKE 1 TABLET DAILY 90 tablet 1   glucose blood (TRUE METRIX BLOOD GLUCOSE TEST) test strip TEST ONE TIME DAILY AS DIRECTED 100 strip 3   Lancets (ONETOUCH DELICA PLUS LANCET33G) MISC SMARTSIG:1 Topical Daily     Multiple Vitamin (MULTIVITAMIN WITH MINERALS) TABS tablet Take 1 tablet by mouth daily.     naproxen sodium (ALEVE) 220 MG tablet Take 2 tablets by mouth daily as needed.     rosuvastatin (CRESTOR) 40 MG tablet TAKE 1 TABLET DAILY 90 tablet 1   Semaglutide, 2 MG/DOSE, 8 MG/3ML SOPN Inject 2 mg as directed once a week. 12 mL 2   sertraline (ZOLOFT) 50 MG tablet TAKE 1 TABLET DAILY 90 tablet 3   gabapentin (NEURONTIN) 100 MG capsule Take 1 capsule (100 mg total) by mouth 3 (three) times daily. 90 capsule 3   Vitamin D, Ergocalciferol, (DRISDOL) 1.25 MG (50000 UNIT) CAPS capsule TAKE 1 CAPSULE (50,000 UNITS TOTAL) BY MOUTH EVERY 7 (SEVEN) DAYS 8 capsule 0   No current facility-administered medications on file prior to visit.     ROS see history of present illness  Objective  Physical Exam Vitals:   03/11/23 1146  BP: 118/70  Pulse: 82  Temp: 97.7 F (36.5 C)  SpO2: 97%  BP Readings from Last 3 Encounters:  03/11/23 118/70  12/05/22 122/76  09/03/22 126/82   Wt Readings from Last 3 Encounters:  03/11/23 203 lb 6.4 oz (92.3 kg)  02/25/23 195 lb (88.5 kg)  12/05/22 201 lb 6.4 oz (91.4 kg)    Physical Exam Constitutional:      General: He is not in acute distress.    Appearance: He is not diaphoretic.  Cardiovascular:     Rate and Rhythm: Normal rate and regular rhythm.     Heart sounds: Normal heart sounds.  Pulmonary:     Effort: Pulmonary effort is normal.     Breath sounds: Normal breath sounds.  Skin:    General: Skin is warm and dry.  Neurological:     Mental Status: He is alert.    Diabetic Foot Exam - Simple   Simple Foot Form Diabetic Foot exam was performed  with the following findings: Yes 03/11/2023 12:00 PM  Visual Inspection No deformities, no ulcerations, no other skin breakdown bilaterally: Yes Sensation Testing Intact to touch and monofilament testing bilaterally: Yes Pulse Check Posterior Tibialis and Dorsalis pulse intact bilaterally: Yes Comments      Assessment/Plan: Please see individual problem list.  Type 2 diabetes mellitus without complication, without long-term current use of insulin (HCC) Assessment & Plan: Chronic issue.  A1c is up from previously.  Patient attributes this to dietary indiscretions.  Discussed the option of working on diet, changing Ozempic to [place], or starting insulin.  Patient opted to work on diet for this.  Continue Ozempic 2 mg weekly and Farxiga 10 mg daily.  Patient will need A1c rechecked in a few months when he establishes care with his new provider.  Orders: -     POCT glycosylated hemoglobin (Hb A1C)  Chronic left-sided low back pain without sciatica Assessment & Plan: Chronic issue.  Generally stable.  Patient brings in paperwork to be filled out for insurance premium coverage.  Discussed this and this would be filled out by tomorrow.  If he does not get a call to pick this up tomorrow afternoon he will contact us.   OSA (obstructive sleep apnea) Assessment & Plan: Chronic issue.  Mild on prior testing.  Patient declined treatment.  He appears to be asymptomatic.  Discussed the risk of cardiovascular issues and death with untreated sleep apnea.  He will monitor for any symptoms and he will let us know if they occur.   Colon cancer screening -     Cologuard    Return in about 6 months (around 09/09/2023) for Transfer of care.   Brad Alar, MD [facility]

## 2023-03-12 ENCOUNTER — Telehealth: Payer: Self-pay | Admitting: Family Medicine

## 2023-03-12 NOTE — Telephone Encounter (Signed)
Disability paperwork received from patient during visit yesterday.  I have completed this form for him.  Please fill in our office information and make available for patient pickup.

## 2023-03-13 DIAGNOSIS — H524 Presbyopia: Secondary | ICD-10-CM | POA: Diagnosis not present

## 2023-03-13 DIAGNOSIS — E119 Type 2 diabetes mellitus without complications: Secondary | ICD-10-CM | POA: Diagnosis not present

## 2023-03-13 DIAGNOSIS — Z0100 Encounter for examination of eyes and vision without abnormal findings: Secondary | ICD-10-CM | POA: Diagnosis not present

## 2023-03-13 NOTE — Telephone Encounter (Signed)
Pt has returned signed portion of [employer] app

## 2023-03-14 ENCOUNTER — Telehealth: Payer: Self-pay

## 2023-03-14 NOTE — Telephone Encounter (Signed)
Left message to call the office back regarding his paperwork.

## 2023-03-14 NOTE — Telephone Encounter (Signed)
Patient called stating to call when the paperwork is finished.

## 2023-03-15 NOTE — Telephone Encounter (Signed)
Patient is aware that paperwork is in the front office ready to be picked up.

## 2023-03-15 NOTE — Telephone Encounter (Signed)
Fax provider portion 03/15/23

## 2023-04-01 DIAGNOSIS — M9904 Segmental and somatic dysfunction of sacral region: Secondary | ICD-10-CM | POA: Diagnosis not present

## 2023-04-01 DIAGNOSIS — Z1211 Encounter for screening for malignant neoplasm of colon: Secondary | ICD-10-CM | POA: Diagnosis not present

## 2023-04-01 DIAGNOSIS — M9903 Segmental and somatic dysfunction of lumbar region: Secondary | ICD-10-CM | POA: Diagnosis not present

## 2023-04-01 DIAGNOSIS — M9905 Segmental and somatic dysfunction of pelvic region: Secondary | ICD-10-CM | POA: Diagnosis not present

## 2023-04-01 DIAGNOSIS — M5431 Sciatica, right side: Secondary | ICD-10-CM | POA: Diagnosis not present

## 2023-04-01 NOTE — Telephone Encounter (Signed)
Called [employer] to follow up, pt has been approved until 04/01/2024

## 2023-04-01 NOTE — Telephone Encounter (Signed)
Faxed provider portion to Dr. Purvis Sheffield office

## 2023-04-04 NOTE — Telephone Encounter (Signed)
 Received [employer] providers portion. Placed in folder to be singed

## 2023-04-05 NOTE — Telephone Encounter (Signed)
 Signed. Placed in signed folder.

## 2023-04-07 LAB — COLOGUARD: COLOGUARD: NEGATIVE

## 2023-04-08 ENCOUNTER — Encounter: Payer: Self-pay | Admitting: Family Medicine

## 2023-04-08 DIAGNOSIS — M9905 Segmental and somatic dysfunction of pelvic region: Secondary | ICD-10-CM | POA: Diagnosis not present

## 2023-04-08 DIAGNOSIS — M5431 Sciatica, right side: Secondary | ICD-10-CM | POA: Diagnosis not present

## 2023-04-08 DIAGNOSIS — M9904 Segmental and somatic dysfunction of sacral region: Secondary | ICD-10-CM | POA: Diagnosis not present

## 2023-04-08 DIAGNOSIS — M9903 Segmental and somatic dysfunction of lumbar region: Secondary | ICD-10-CM | POA: Diagnosis not present

## 2023-04-08 NOTE — Telephone Encounter (Signed)
 Providers form faxed to [employer] as well as pharmacy team

## 2023-04-15 DIAGNOSIS — M9904 Segmental and somatic dysfunction of sacral region: Secondary | ICD-10-CM | POA: Diagnosis not present

## 2023-04-15 DIAGNOSIS — M5431 Sciatica, right side: Secondary | ICD-10-CM | POA: Diagnosis not present

## 2023-04-15 DIAGNOSIS — M9905 Segmental and somatic dysfunction of pelvic region: Secondary | ICD-10-CM | POA: Diagnosis not present

## 2023-04-15 DIAGNOSIS — M9903 Segmental and somatic dysfunction of lumbar region: Secondary | ICD-10-CM | POA: Diagnosis not present

## 2023-04-24 NOTE — Telephone Encounter (Signed)
Called [employer] to follow up PAP: Patient assistance application Farxiga for has been approved by PAP Companies: [employer] from 04/03/2023 to 04/01/2024. Medication should be delivered to PAP Delivery: Home For further shipping updates, please contact [employer] ([employer]) at [phone] Pt ID is: Waiting on approval letter

## 2023-04-30 ENCOUNTER — Telehealth: Payer: Self-pay

## 2023-04-30 NOTE — Telephone Encounter (Signed)
Copied from CRM [id number]. Topic: Clinical - Medication Question >> Apr 30, 2023 12:12 PM Brad Jimenez wrote: Reason for CRM: Patient states he out out of his dapagliflozin propanediol (FARXIGA) 10 MG TABS tablet and is inquiring on if the clinic has any samples of this? Please call him back at [phone]

## 2023-04-30 NOTE — Telephone Encounter (Signed)
Called pt and made aware that medication is ready for pick up in front office.

## 2023-04-30 NOTE — Telephone Encounter (Signed)
Ok to give samples. Make sure he has f/u with new PCP and f/u labs (met b,etc) scheduled.

## 2023-04-30 NOTE — Telephone Encounter (Signed)
Medication Samples have been provided to the patient.  Drug name: [name]       Strength: 10 Mg        Qty: 2 boxes  LOT: [id number]   Exp.Date: 06/30/2025  Dosing instructions: Take 1 tablet (10 mg total) by mouth daily before breakfast.   The patient has been instructed regarding the correct time, dose, and frequency of taking this medication, including desired effects and most common side effects.   Brad Endo  [name] 1:14 PM 04/30/2023   Pt made aware on 04/30/2023 @ 1:15 pm

## 2023-06-04 ENCOUNTER — Encounter: Payer: Medicare HMO | Admitting: Nurse Practitioner

## 2023-06-04 ENCOUNTER — Ambulatory Visit: Payer: Self-pay | Admitting: Family Medicine

## 2023-06-04 NOTE — Telephone Encounter (Signed)
 Copied From CRM [id number]. Reason for Triage: Spouse calling in regard to patient - states he is currently experiencing chest congestion, mucus, coughing a lot - he has been taking DayQuil,NyQuil & cough drops, but he is requesting an antibiotic.      Chief Complaint: sinus/cold symptoms Symptoms: Sinus pressure, Cough, Runny Nose, Post nasal drip Frequency: x 1 week Pertinent Negatives: Patient denies difficulty breathing, fever, coughing up blood. Disposition: [] ED /[] Urgent Care (no appt availability in office) / [x] Appointment(In office/virtual)/ []  [facility]/ [] Home Care/ [] Refused Recommended Disposition /[] [place] [facility]/ []  Follow-up with PCP Additional Notes: Patient called and advised that he has been sick for about a week.  He has been taking DayQuil and NyQuil and he has clear phlegm when his nose is running. Patient has some sinus drainage in the first few days.  He states that his ears have popped a few times.  He denies any difficulty breathing, fever, coughing up blood. He states that he feels like a head/sinus cold but it is just lasting a little longer and he thinks he needs an antibiotic.  Appointment is made for tomorrow 06/05/2023 at 3pm with Dr Vicente Masson. Patient is given Care Advice as per protocol and advised that if anything gets worse to go to the Emergency Room.  Patient verbalized understanding.  Reason for Disposition  [1] Continuous (nonstop) coughing interferes with work or school AND [2] no improvement using cough treatment per Care Advice  Answer Assessment - Initial Assessment Questions 1. ONSET: "When did the cough begin?"      1 week 2. SEVERITY: "How bad is the cough today?"      1 week persistent 3. SPUTUM: "Describe the color of your sputum" (none, dry cough; clear, white, yellow, green)     clear 4. HEMOPTYSIS: "Are you coughing up any blood?" If so ask: "How much?" (flecks, streaks, tablespoons, etc.)     No 5. DIFFICULTY  BREATHING: "Are you having difficulty breathing?" If Yes, ask: "How bad is it?" (e.g., mild, moderate, severe)    - MILD: No SOB at rest, mild SOB with walking, speaks normally in sentences, can lie down, no retractions, pulse < 100.    - MODERATE: SOB at rest, SOB with minimal exertion and prefers to sit, cannot lie down flat, speaks in phrases, mild retractions, audible wheezing, pulse 100-120.    - SEVERE: Very SOB at rest, speaks in single words, struggling to breathe, sitting hunched forward, retractions, pulse > 120      No 6. FEVER: "Do you have a fever?" If Yes, ask: "What is your temperature, how was it measured, and when did it start?"     No 7. CARDIAC HISTORY: "Do you have any history of heart disease?" (e.g., heart attack, congestive heart failure)      No 8. LUNG HISTORY: "Do you have any history of lung disease?"  (e.g., pulmonary embolus, asthma, emphysema)     Asthma but hasn't ever needed inhalers 9. PE RISK FACTORS: "Do you have a history of blood clots?" (or: recent major surgery, recent prolonged travel, bedridden)     No 10. OTHER SYMPTOMS: "Do you have any other symptoms?" (e.g., runny nose, wheezing, chest pain)       Runny nose,  12. TRAVEL: "Have you traveled out of the country in the last month?" (e.g., travel history, exposures)       No  Protocols used: Cough - Acute Productive-A-AH

## 2023-06-05 ENCOUNTER — Ambulatory Visit: Admitting: Internal Medicine

## 2023-06-05 ENCOUNTER — Encounter: Payer: Self-pay | Admitting: Internal Medicine

## 2023-06-05 VITALS — BP 112/78 | HR 85 | Temp 97.6°F | Ht 67.0 in | Wt 207.8 lb

## 2023-06-05 DIAGNOSIS — J069 Acute upper respiratory infection, unspecified: Secondary | ICD-10-CM | POA: Insufficient documentation

## 2023-06-05 LAB — POCT INFLUENZA A/B
Influenza A, POC: NEGATIVE
Influenza B, POC: NEGATIVE

## 2023-06-05 LAB — POC COVID19 BINAXNOW: SARS Coronavirus 2 Ag: NEGATIVE

## 2023-06-05 MED ORDER — HYDROCOD POLI-CHLORPHE POLI ER 10-8 MG/5ML PO SUER
5.0000 mL | Freq: Two times a day (BID) | ORAL | 0 refills | Status: DC | PRN
Start: 2023-06-05 — End: 2023-06-26

## 2023-06-05 NOTE — Patient Instructions (Signed)
-  It was a pleasure meeting you today -I suspect you likely have a viral upper respiratory tract infection that is causing your symptoms -I would continue with conservative treatment with DayQuil and NyQuil -I have prescribed a cough syrup to help with your nighttime cough.  It does contain an opiate so it can make you drowsy. -Please continue with warm showers with steam inhalation. -Try using a Nettie pot to help with your nasal congestion -Please contact us with any questions or concerns

## 2023-06-05 NOTE — Assessment & Plan Note (Signed)
-  Patient presented today with productive cough with green phlegm, rhinorrhea with associated malaise and fatigue and mild shortness of breath.  Patient symptoms have been 3 days. -On exam, lungs are clear to auscultation bilaterally.  Right TM with diminished light reflex.  Exam was otherwise normal. -I suspect he likely has a viral URI -Given that he does have persistent cough which is worse at night which is keeping him up we will prescribe Tussionex to help with this cough -Continue conservative treatment with DayQuil/NyQuil, warm showers with steam inhalation and and using Nettie pot for his nasal congestion -Return precautions given to the patient

## 2023-06-05 NOTE — Progress Notes (Addendum)
 Acute Office Visit  Subjective:     Patient ID: Brad Jimenez., male    DOB: 01-20-1971, 53 y.o.   MRN: [id number]  Chief complaint: Cough   HPI Patient is in today for patient states that he has been having a cough for the last week.  Cough is productive with clear phlegm.  Patient also complains of associated rhinorrhea and nasal congestion.  He also complains of some congestion in his right ear and feels that his right ear keeps "popping".  He states that he also has associated malaise and fatigue.  His symptoms have been improving over the last couple of days.  However, he does have a persistent cough which is worse at night and wakes him up.  No fevers or chills.  Does have some mild shortness of breath with ambulation.  Denies any sinus congestion or pain  Review of Systems  Constitutional:  Positive for malaise/fatigue. Negative for chills and fever.  HENT:  Positive for congestion. Negative for ear discharge, ear pain and sinus pain.   Respiratory:  Positive for cough, sputum production and shortness of breath. Negative for hemoptysis and wheezing.   Cardiovascular: Negative.   Gastrointestinal:  Negative for nausea and vomiting.  Neurological: Negative.   Psychiatric/Behavioral: Negative.          Objective:    BP 112/78   Pulse 85   Temp 97.6 F (36.4 C)   Ht 5\' 7"  (1.702 m)   Wt 207 lb 12.8 oz (94.3 kg)   SpO2 98%   BMI 32.55 kg/m    Physical Exam Constitutional:      Appearance: Normal appearance.  HENT:     Head: Normocephalic and atraumatic.     Right Ear: Ear canal and external ear normal. There is no impacted cerumen.     Left Ear: Tympanic membrane, ear canal and external ear normal. There is no impacted cerumen.     Ears:     Comments: Right TM with diminished light reflex    Mouth/Throat:     Mouth: Mucous membranes are moist.     Pharynx: Oropharynx is clear. No oropharyngeal exudate or posterior oropharyngeal erythema.  Cardiovascular:      Rate and Rhythm: Normal rate and regular rhythm.  Pulmonary:     Effort: Pulmonary effort is normal.     Breath sounds: Normal breath sounds. No wheezing, rhonchi or rales.  Musculoskeletal:     Cervical back: Neck supple.  Lymphadenopathy:     Cervical: No cervical adenopathy.  Neurological:     Mental Status: He is alert.     Results for orders placed or performed in visit on 06/05/23  POC COVID-19 BinaxNow  Result Value Ref Range   SARS Coronavirus 2 Ag Negative Negative  POCT Influenza A/B  Result Value Ref Range   Influenza A, POC Negative Negative   Influenza B, POC Negative Negative        Assessment & Plan:   Problem List Items Addressed This Visit       Respiratory   URI (upper respiratory infection) - Primary   -Patient presented today with productive cough with green phlegm, rhinorrhea with associated malaise and fatigue and mild shortness of breath.  Patient symptoms have been 3 days. -On exam, lungs are clear to auscultation bilaterally.  Right TM with diminished light reflex.  Exam was otherwise normal. -I suspect he likely has a viral URI -Given that he does have persistent cough which is worse at  night which is keeping him up we will prescribe Tussionex to help with this cough -Continue conservative treatment with DayQuil/NyQuil, warm showers with steam inhalation and and using Nettie pot for his nasal congestion -Return precautions given to the patient      Relevant Medications   chlorpheniramine-HYDROcodone (TUSSIONEX) 10-8 MG/5ML   Other Relevant Orders   POC COVID-19 BinaxNow (Completed)   POCT Influenza A/B (Completed)    Meds ordered this encounter  Medications   chlorpheniramine-HYDROcodone (TUSSIONEX) 10-8 MG/5ML    Sig: Take 5 mLs by mouth every 12 (twelve) hours as needed for cough.    Dispense:  115 mL    Refill:  0    No follow-ups on file.  Earl Lagos, MD

## 2023-06-20 ENCOUNTER — Telehealth: Payer: Self-pay

## 2023-06-20 NOTE — Telephone Encounter (Signed)
 Pt notified ozempic was here and ready for pick up.   Ozempic: 4 boxes exp:11/30/25 lot: [id number]

## 2023-06-26 ENCOUNTER — Encounter: Payer: Self-pay | Admitting: Nurse Practitioner

## 2023-06-26 ENCOUNTER — Ambulatory Visit (INDEPENDENT_AMBULATORY_CARE_PROVIDER_SITE_OTHER): Admitting: Nurse Practitioner

## 2023-06-26 VITALS — BP 110/70 | HR 79 | Temp 97.8°F | Ht 67.0 in | Wt 202.2 lb

## 2023-06-26 DIAGNOSIS — E119 Type 2 diabetes mellitus without complications: Secondary | ICD-10-CM

## 2023-06-26 DIAGNOSIS — Z7985 Long-term (current) use of injectable non-insulin antidiabetic drugs: Secondary | ICD-10-CM

## 2023-06-26 DIAGNOSIS — F32A Depression, unspecified: Secondary | ICD-10-CM

## 2023-06-26 DIAGNOSIS — F419 Anxiety disorder, unspecified: Secondary | ICD-10-CM | POA: Diagnosis not present

## 2023-06-26 DIAGNOSIS — E7800 Pure hypercholesterolemia, unspecified: Secondary | ICD-10-CM

## 2023-06-26 DIAGNOSIS — Z7984 Long term (current) use of oral hypoglycemic drugs: Secondary | ICD-10-CM | POA: Diagnosis not present

## 2023-06-26 DIAGNOSIS — G4733 Obstructive sleep apnea (adult) (pediatric): Secondary | ICD-10-CM

## 2023-06-26 LAB — COMPREHENSIVE METABOLIC PANEL
ALT: 29 U/L (ref 0–53)
AST: 19 U/L (ref 0–37)
Albumin: 4.6 g/dL (ref 3.5–5.2)
Alkaline Phosphatase: 86 U/L (ref 39–117)
BUN: 17 mg/dL (ref 6–23)
CO2: 25 meq/L (ref 19–32)
Calcium: 9.3 mg/dL (ref 8.4–10.5)
Chloride: 102 meq/L (ref 96–112)
Creatinine, Ser: 0.68 mg/dL (ref 0.40–1.50)
GFR: 105.91 mL/min (ref >60.00)
Glucose, Bld: 180 mg/dL — ABNORMAL HIGH (ref 70–99)
Potassium: 3.6 meq/L (ref 3.5–5.1)
Sodium: 135 meq/L (ref 135–145)
Total Bilirubin: 0.5 mg/dL (ref 0.2–1.2)
Total Protein: 7.5 g/dL (ref 6.0–8.3)

## 2023-06-26 LAB — LIPID PANEL
Cholesterol: 115 mg/dL (ref 0–200)
HDL: 38.7 mg/dL — ABNORMAL LOW (ref >39.00)
LDL Cholesterol: 17 mg/dL (ref 0–99)
NonHDL: 75.89
Total CHOL/HDL Ratio: 3
Triglycerides: 292 mg/dL — ABNORMAL HIGH (ref 0.0–149.0)
VLDL: 58.4 mg/dL — ABNORMAL HIGH (ref 0.0–40.0)

## 2023-06-26 LAB — HEMOGLOBIN A1C: Hgb A1c MFr Bld: 7.2 % — ABNORMAL HIGH (ref 4.6–6.5)

## 2023-06-26 MED ORDER — SEMAGLUTIDE (2 MG/DOSE) 8 MG/3ML ~~LOC~~ SOPN: 2.0000 mg | PEN_INJECTOR | SUBCUTANEOUS | 3 refills | Status: DC

## 2023-06-26 MED ORDER — SERTRALINE HCL 50 MG PO TABS: 50.0000 mg | ORAL_TABLET | Freq: Every day | ORAL | 3 refills | Status: DC

## 2023-06-26 MED ORDER — DAPAGLIFLOZIN PROPANEDIOL 10 MG PO TABS: 10.0000 mg | ORAL_TABLET | Freq: Every day | ORAL | 3 refills | Status: AC

## 2023-06-26 NOTE — Assessment & Plan Note (Signed)
 Managed with Ozempic and [place]. Non-compliance with diet affects glycemic control, with blood sugar levels in the 140s-150s mg/dL. A1c goal is 7%, previously 7.5%. Check A1c level today. Encourage dietary modifications to reduce sugar intake. Continue Ozempic 2 mg weekly and Farxiga 10 mg daily.

## 2023-06-26 NOTE — Assessment & Plan Note (Signed)
 He is skeptical about the diagnosis and attributes symptoms to other factors. Not interested in further evaluation or treatment. Offer to repeat sleep study if interested.

## 2023-06-26 NOTE — Progress Notes (Signed)
 Bethanie Dicker, NP-C Phone: [phone]  Brad Jimenez. is a 53 y.o. male who presents today for transfer of care.   Discussed the use of AI scribe software for clinical note transcription with the patient, who gave verbal consent to proceed.  History of Present Illness   Brad Jimenez. is a 53 year old male with hyperlipidemia and type 2 diabetes who presents for transfer of care with concerns about muscle aches.  He has been experiencing muscle aches since starting Crestor in December. The aches subsided when he stopped the medication for a month but returned, albeit less severely, upon resuming it. He is reluctant to switch medications frequently and prefers to manage the side effects. He had a similar experience with Lipitor, which also caused muscle aches. He is currently taking Crestor and Zetia for cholesterol management. His cholesterol levels, particularly LDL, have been well-controlled with these medications.  Regarding type 2 diabetes management, he notes that previous A1c results were elevated at 7.5 in December. He admits to dietary indiscretions, including a preference for candy, ice cream, and bread, which affects his blood sugar levels. He occasionally checks his blood sugar, which ranges from 140 to 150 mg/dL in the mornings. He is currently on Ozempic 2 mg and Farxiga for diabetes management. No excessive thirst, excessive urination, or low blood sugar episodes.  He mentions a diagnosis of mild sleep apnea but is skeptical about its accuracy. He feels tired and achy upon waking, attributing it to back surgery and needing a new mattress. His wife has observed episodes of him stopping breathing during sleep, but he does not feel excessively tired during the day. He is not interested in treatment at this time.      Social History   Tobacco Use  Smoking Status Former   Current packs/day: 0.00   Average packs/day: 1.5 packs/day for 24.0 years (36.0 ttl pk-yrs)   Types:  Cigarettes   Start date: 02/20/1987   Quit date: 02/20/2011   Years since quitting: 12.3  Smokeless Tobacco Never    Current Outpatient Medications on File Prior to Visit  Medication Sig Dispense Refill   blood glucose meter kit and supplies KIT Dispense based on patient and insurance preference. Use once daily as directed. (FOR ICD-10 E11.9). 1 each 0   Cholecalciferol (VITAMIN D3) 50 MCG (2000 UT) capsule Take 2,000 Units by mouth daily.     cyanocobalamin (VITAMIN B12) 1000 MCG tablet Take 1,000 mcg by mouth daily.     ezetimibe (ZETIA) 10 MG tablet TAKE 1 TABLET DAILY 90 tablet 1   glucose blood (TRUE METRIX BLOOD GLUCOSE TEST) test strip TEST ONE TIME DAILY AS DIRECTED 100 strip 3   Lancets (ONETOUCH DELICA PLUS LANCET33G) MISC SMARTSIG:1 Topical Daily     Multiple Vitamin (MULTIVITAMIN WITH MINERALS) TABS tablet Take 1 tablet by mouth daily.     naproxen sodium (ALEVE) 220 MG tablet Take 2 tablets by mouth daily as needed.     rosuvastatin (CRESTOR) 40 MG tablet TAKE 1 TABLET DAILY 90 tablet 1   No current facility-administered medications on file prior to visit.    ROS see history of present illness  Objective  Physical Exam Vitals:   06/26/23 0849  BP: 110/70  Pulse: 79  Temp: 97.8 F (36.6 C)  SpO2: 99%    BP Readings from Last 3 Encounters:  06/26/23 110/70  06/05/23 112/78  03/11/23 118/70   Wt Readings from Last 3 Encounters:  06/26/23 202 lb 3.2  oz (91.7 kg)  06/05/23 207 lb 12.8 oz (94.3 kg)  03/11/23 203 lb 6.4 oz (92.3 kg)    Physical Exam Constitutional:      General: He is not in acute distress.    Appearance: Normal appearance.  HENT:     Head: Normocephalic.  Cardiovascular:     Rate and Rhythm: Normal rate and regular rhythm.     Heart sounds: Normal heart sounds.  Pulmonary:     Effort: Pulmonary effort is normal.     Breath sounds: Normal breath sounds.  Skin:    General: Skin is warm and dry.  Neurological:     General: No focal  deficit present.     Mental Status: He is alert.  Psychiatric:        Mood and Affect: Mood normal.        Behavior: Behavior normal.     Assessment/Plan: Please see individual problem list.  Type 2 diabetes mellitus without complication, without long-term current use of insulin (HCC) Assessment & Plan: Managed with Ozempic and [place]. Non-compliance with diet affects glycemic control, with blood sugar levels in the 140s-150s mg/dL. A1c goal is 7%, previously 7.5%. Check A1c level today. Encourage dietary modifications to reduce sugar intake. Continue Ozempic 2 mg weekly and Farxiga 10 mg daily.   Orders: -     Hemoglobin A1c -     Semaglutide (2 MG/DOSE); Inject 2 mg as directed once a week.  Dispense: 9 mL; Refill: 3 -     Dapagliflozin Propanediol; Take 1 tablet (10 mg total) by mouth daily before breakfast.  Dispense: 90 tablet; Refill: 3  Elevated LDL cholesterol level Assessment & Plan: Muscle aches improved after stopping Crestor but returned when resumed. He prefers Crestor due to fewer side effects compared to other statins. Discussed adjusting Crestor 40 mg to every other day or decreasing dosage to 20 mg. He will continue Zetia 10 mg daily. Check lipid panel and CMP today.  Orders: -     Comprehensive metabolic panel -     Lipid panel  Anxiety and depression Assessment & Plan: Mood is well controlled on Zoloft. Continue Zoloft 50 mg daily. Encouraged to contact if worsening symptoms, unusual behavior changes or suicidal thoughts occur.  Orders: -     Sertraline HCl; Take 1 tablet (50 mg total) by mouth daily.  Dispense: 90 tablet; Refill: 3  OSA (obstructive sleep apnea) Assessment & Plan: He is skeptical about the diagnosis and attributes symptoms to other factors. Not interested in further evaluation or treatment. Offer to repeat sleep study if interested.     Return in about 3 months (around 09/26/2023) for Follow up.   Bethanie Dicker, NP-C [facility]  - [facility]

## 2023-06-26 NOTE — Assessment & Plan Note (Signed)
 Muscle aches improved after stopping Crestor but returned when resumed. He prefers Crestor due to fewer side effects compared to other statins. Discussed adjusting Crestor 40 mg to every other day or decreasing dosage to 20 mg. He will continue Zetia 10 mg daily. Check lipid panel and CMP today.

## 2023-06-26 NOTE — Assessment & Plan Note (Signed)
 Mood is well controlled on Zoloft. Continue Zoloft 50 mg daily. Encouraged to contact if worsening symptoms, unusual behavior changes or suicidal thoughts occur.

## 2023-06-28 ENCOUNTER — Encounter: Payer: Self-pay | Admitting: Nurse Practitioner

## 2023-07-08 ENCOUNTER — Ambulatory Visit: Payer: Self-pay

## 2023-07-08 NOTE — Telephone Encounter (Signed)
  Chief Complaint: Hand Pain that goes into his wrist and forearm. Symptoms: burning sensation to the top of left hand-small knot to left wrist that goes into mid forearm Frequency: started sometime last week Pertinent Negatives: Patient denies CP, SOB Disposition: [] ED /[] Urgent Care (no appt availability in office) / [x] Appointment(In office/virtual)/ []  [facility]/ [] Home Care/ [] Refused Recommended Disposition /[] [facility]/ []  Follow-up with PCP Additional Notes: patient called with concerns for left hand pain that goes into his wrist and mid forearm. Patient reports a burning sensation to the top of left hand that goes into wrist. Patient endorses that the pain increases depending on how he moves his fingers. Patient states pain started sometime last week. Denies CP or SOB. Per protocol, patient is recommended to be seen within three days. First appointment with PCP was too early for patient. Appointment made for 07/09/2023 at 1:40 PM with another provider in PCP office. Patient verbalized understanding and all questions answered.    Copied from CRM [id number]. Topic: Clinical - Red Word Triage >> Jul 08, 2023  1:22 PM Gurney Maxin H wrote: [facility] that prompted transfer to Nurse Triage: Burning sensation feels like its on fire on top of left hand and going to his arm, knot on arm and blood vessel raised Reason for Disposition  [1] MODERATE pain (e.g., interferes with normal activities) AND [2] present > 3 days  Answer Assessment - Initial Assessment Questions 1. ONSET: "When did the pain start?"     Started sometime last week 2. LOCATION: "Where is the pain located?"     Left hand 3. PAIN: "How bad is the pain?" (Scale 1-10; or mild, moderate, severe)   - MILD (1-3): doesn't interfere with normal activities   - MODERATE (4-7): interferes with normal activities (e.g., work or school) or awakens from sleep   - SEVERE (8-10): excruciating pain, unable to use hand at all      5 out of 10 4. WORK OR EXERCISE: "Has there been any recent work or exercise that involved this part (i.e., hand or wrist) of the body?"     no 5. CAUSE: "What do you think is causing the pain?"     unsure 6. AGGRAVATING FACTORS: "What makes the pain worse?" (e.g., using computer)     Patient states if he moves his wrist a certain way that the pain gets worse 7. OTHER SYMPTOMS: "Do you have any other symptoms?" (e.g., neck pain, swelling, rash, numbness, fever)     Knot on left forearm with raised blood vessel  Protocols used: Hand and Wrist Pain-A-AH

## 2023-07-09 ENCOUNTER — Encounter: Payer: Self-pay | Admitting: Family Medicine

## 2023-07-09 ENCOUNTER — Ambulatory Visit (INDEPENDENT_AMBULATORY_CARE_PROVIDER_SITE_OTHER): Admitting: Family Medicine

## 2023-07-09 VITALS — BP 124/74 | HR 88 | Temp 98.2°F | Resp 20 | Ht 67.0 in | Wt 206.2 lb

## 2023-07-09 DIAGNOSIS — M65839 Other synovitis and tenosynovitis, unspecified forearm: Secondary | ICD-10-CM

## 2023-07-09 MED ORDER — NABUMETONE 500 MG PO TABS
500.0000 mg | ORAL_TABLET | Freq: Every day | ORAL | 0 refills | Status: DC
Start: 2023-07-09 — End: 2023-08-13

## 2023-07-09 MED ORDER — GABAPENTIN 300 MG PO CAPS
300.0000 mg | ORAL_CAPSULE | Freq: Every day | ORAL | 0 refills | Status: DC
Start: 2023-07-09 — End: 2023-08-13

## 2023-07-09 NOTE — Patient Instructions (Addendum)
 It was a pleasure meeting you today. Thank you for allowing me to take part in your health care.  Our goals for today as we discussed include:  Start Ralefan 500 mg two times a day as needed for pain. Do not take Aleve while taking this medication  Trial Gabapentin 300 mg at night for neuropathy.    Avoid repetitive movement Wrist brace to allow for rest. Can wear at night.  If no improvement in 2-4 weeks please notify MD   This is a list of the screening recommended for you and due dates:  Health Maintenance  Topic Date Due   Hepatitis C Screening  Never done   Zoster (Shingles) Vaccine (1 of 2) Never done   Eye exam for diabetics  03/06/2022   COVID-19 Vaccine (4 - 2024-25 season) 12/02/2022   Screening for Lung Cancer  08/01/2023   Yearly kidney health urinalysis for diabetes  09/03/2023   Flu Shot  11/01/2023   Hemoglobin A1C  12/27/2023   Medicare Annual Wellness Visit  02/25/2024   Complete foot exam   03/10/2024   Yearly kidney function blood test for diabetes  06/25/2024   DTaP/Tdap/Td vaccine (2 - Td or Tdap) 06/06/2025   Colon Cancer Screening  04/11/2032   Pneumococcal Vaccination  Completed   HIV Screening  Completed   HPV Vaccine  Aged Out     If you have any questions or concerns, please do not hesitate to call the office at [phone].  I look forward to our next visit and until then take care and stay safe.  Regards,   Dana Allan, MD   [facility]

## 2023-07-09 NOTE — Progress Notes (Signed)
 SUBJECTIVE:   Chief Complaint  Patient presents with   Hand Pain    Left hand to wrist burning sensation X 11 days   HPI Presents for acute visit  Discussed the use of AI scribe software for clinical note transcription with the patient, who gave verbal consent to proceed.  History of Present Illness Brad Jimenez. is a 53 year old male who presents with a burning sensation in the left hand and arm. He is accompanied by his wife, who expressed concern about the possibility of a blood clot.  He has been experiencing a burning sensation in his left hand, particularly on the dorsal aspect, which occurs with certain movements such as throwing away trash or laying something down. This sensation began around June 28, 2023, approximately a week and a half ago. Recently, the burning extended down his arm, and he noticed a swollen vein or nerve in the area. The burning feels like 'it's on fire' and occurs sporadically throughout the day with activities like opening a car door or laying down a clipboard. No recent trauma to the hand, although he hit his thumb with a hammer two months ago.  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 Despite this, he denies any swelling, fever, numbness, or weakness in the hand.  He takes one Aleve (220 mg) daily and occasionally uses ibuprofen for pain management. He has previously used gabapentin  for nerve-related pain and has some at home from a past prescription related to nerve sensations in his stomach and side, attributed to his diabetes. He has a history of back surgeries and has been on various pain medications in the past, but currently manages with Aleve and occasional ibuprofen.    PERTINENT PMH / PSH: As above  OBJECTIVE:  BP 124/74   Pulse 88   Temp 98.2 F (36.8 C)   Resp 20   Ht 5\' 7"  (1.702 m)   Wt 206 lb 4 oz (93.6 kg)   SpO2 99%   BMI 32.30 kg/m    Physical Exam Vitals reviewed.   Constitutional:      General: He is not in acute distress.    Appearance: Normal appearance. He is not ill-appearing, toxic-appearing or diaphoretic.  Eyes:     General:        Right eye: No discharge.        Left eye: No discharge.  Cardiovascular:     Rate and Rhythm: Normal rate and regular rhythm.     Heart sounds: Normal heart sounds.  Pulmonary:     Effort: Pulmonary effort is normal.     Breath sounds: Normal breath sounds.  Abdominal:     General: Bowel sounds are normal.  Musculoskeletal:        General: Normal range of motion.     Right wrist: Normal. Normal pulse.     Left wrist: Tenderness present. No swelling, bony tenderness, snuff box tenderness or crepitus. Normal range of motion. Normal pulse.     Cervical back: Normal range of motion.  Skin:    General: Skin is warm and dry.  Neurological:     Mental Status: He is alert and oriented to person, place, and time. Mental status is at baseline.  Psychiatric:        Mood and Affect: Mood normal.        Behavior: Behavior normal.        Thought Content: Thought content normal.  Judgment: Judgment normal.            07/09/2023    1:55 PM 06/05/2023    4:25 PM 02/25/2023    3:54 PM 12/05/2022    1:28 PM 09/03/2022    2:13 PM  Depression screen PHQ 2/9  Decreased Interest 0 0 0 1 0  Down, Depressed, Hopeless 0 0 0 0 0  PHQ - 2 Score 0 0 0 1 0  Altered sleeping 0 0 0 1 0  Tired, decreased energy 0 0 0 1 0  Change in appetite 0 0 0 1 0  Feeling bad or failure about yourself  0 0 0 0 0  Trouble concentrating 0 0 0 0 0  Moving slowly or fidgety/restless 0 0 0 0 0  Suicidal thoughts 0 0 0 0 0  PHQ-9 Score 0 0 0 4 0  Difficult doing work/chores Not difficult at all Not difficult at all Not difficult at all Not difficult at all Not difficult at all      07/09/2023    1:56 PM 06/05/2023    4:25 PM 12/05/2022    1:28 PM 09/03/2022    2:14 PM  GAD 7 : Generalized Anxiety Score  Nervous, Anxious, on Edge 0 0 0 0   Control/stop worrying 0 0 0 0  Worry too much - different things 0 0 0 0  Trouble relaxing 0 0 0 0  Restless 0 0 0 0  Easily annoyed or irritable 0 0 0 0  Afraid - awful might happen 0 0 0 0  Total GAD 7 Score 0 0 0 0  Anxiety Difficulty Not difficult at all Not difficult at all Not difficult at all Not difficult at all    ASSESSMENT/PLAN:  Intersection syndrome of wrist -     Gabapentin ; Take 1 capsule (300 mg total) by mouth at bedtime.  Dispense: 30 capsule; Refill: 0 -     Nabumetone ; Take 1 tablet (500 mg total) by mouth daily.  Dispense: 30 tablet; Refill: 0 -     Wrist splint  Intersection syndrome Assessment & Plan: Burning sensation in left hand, likely tendonitis due to repetitive motion. Differential includes ganglion cyst, but tendonitis more probable. Condition may resolve spontaneously. Also considered DM neuropathy but less likely given unilateral. He is currently taking Gabapentin  100 mg daily. - Increase gabapentin  from 100 mg to 300 mg at night for burning sensation. - Start Nabumetone  500 mg BID for inflammation. - Recommend wrist brace to limit movement. - If no improvement follow up with PCP to discuss imaging and EMG       PDMP reviewed  Return if symptoms worsen or fail to improve, for PCP.  Valli Gaw, MD

## 2023-07-18 DIAGNOSIS — M79642 Pain in left hand: Secondary | ICD-10-CM | POA: Diagnosis not present

## 2023-07-21 ENCOUNTER — Encounter: Payer: Self-pay | Admitting: Family Medicine

## 2023-07-21 DIAGNOSIS — M65839 Other synovitis and tenosynovitis, unspecified forearm: Secondary | ICD-10-CM | POA: Insufficient documentation

## 2023-07-21 NOTE — Assessment & Plan Note (Addendum)
 Burning sensation in left hand, likely tendonitis due to repetitive motion. Differential includes ganglion cyst, but tendonitis more probable. Condition may resolve spontaneously. Also considered DM neuropathy but less likely given unilateral. He is currently taking Gabapentin  100 mg daily. - Increase gabapentin  from 100 mg to 300 mg at night for burning sensation. - Start Nabumetone  500 mg BID for inflammation. - Recommend wrist brace to limit movement. - If no improvement follow up with PCP to discuss imaging and EMG

## 2023-08-07 ENCOUNTER — Other Ambulatory Visit: Payer: Self-pay | Admitting: Family Medicine

## 2023-08-07 DIAGNOSIS — M65839 Other synovitis and tenosynovitis, unspecified forearm: Secondary | ICD-10-CM

## 2023-08-12 NOTE — Telephone Encounter (Signed)
 Is it okay to refill? Last filled by Dr. Sueanne Emerald on 07/09/2023. Last OV: 3/26/205 with Bluford Burkitt, NP Next OV: 09/26/2023 with Bluford Burkitt, NP

## 2023-09-26 ENCOUNTER — Encounter: Payer: Self-pay | Admitting: Emergency Medicine

## 2023-09-26 ENCOUNTER — Ambulatory Visit: Admitting: Nurse Practitioner

## 2023-09-26 VITALS — BP 110/70 | HR 83 | Temp 98.3°F | Ht 67.0 in | Wt 203.4 lb

## 2023-09-26 DIAGNOSIS — Z87891 Personal history of nicotine dependence: Secondary | ICD-10-CM

## 2023-09-26 DIAGNOSIS — F419 Anxiety disorder, unspecified: Secondary | ICD-10-CM

## 2023-09-26 DIAGNOSIS — Z7984 Long term (current) use of oral hypoglycemic drugs: Secondary | ICD-10-CM

## 2023-09-26 DIAGNOSIS — F32A Depression, unspecified: Secondary | ICD-10-CM | POA: Diagnosis not present

## 2023-09-26 DIAGNOSIS — E7800 Pure hypercholesterolemia, unspecified: Secondary | ICD-10-CM | POA: Diagnosis not present

## 2023-09-26 DIAGNOSIS — Z7985 Long-term (current) use of injectable non-insulin antidiabetic drugs: Secondary | ICD-10-CM

## 2023-09-26 DIAGNOSIS — E1165 Type 2 diabetes mellitus with hyperglycemia: Secondary | ICD-10-CM

## 2023-09-26 LAB — POCT GLYCOSYLATED HEMOGLOBIN (HGB A1C)
HbA1c POC (<> result, manual entry): 8.5 % (ref 4.0–5.6)
HbA1c, POC (controlled diabetic range): 8.5 % — AB (ref 0.0–7.0)
HbA1c, POC (prediabetic range): 8.5 % — AB (ref 5.7–6.4)
Hemoglobin A1C: 8.5 % — AB (ref 4.0–5.6)

## 2023-09-26 MED ORDER — TIRZEPATIDE 7.5 MG/0.5ML ~~LOC~~ SOAJ: 7.5000 mg | SUBCUTANEOUS | 1 refills | Status: DC

## 2023-09-26 MED ORDER — TIRZEPATIDE 5 MG/0.5ML ~~LOC~~ SOAJ: 5.0000 mg | SUBCUTANEOUS | 0 refills | Status: DC

## 2023-09-26 NOTE — Progress Notes (Signed)
 Brad Glance, NP-C Phone: [phone]  Boss Danielsen. is a 53 y.o. male who presents today for follow up.   Discussed the use of AI scribe software for clinical note transcription with the patient, who gave verbal consent to proceed.  History of Present Illness   Tc Kapusta. is a 53 year old male with type 2 diabetes who presents for a routine follow-up.  He occasionally indulges in sweets and Pepsi once a week but denies significant changes in his diet. He is currently on Ozempic  2 mg and Farxiga , and occasionally checks his blood sugar. No excessive thirst, urination, or hypoglycemic episodes.  He continues to take Crestor  and Zetia  for hyperlipidemia without experiencing abdominal pain or muscle aches. He finds that taking his medication every other day helps with shoulder pain and general aches.  He is on Zoloft  for mood management and feels good without anxiety or depression. He has discontinued gabapentin , which was previously used for wrist pain.  He has a history of smoking but quit over 14 years ago. No current smoking, shortness of breath, or other respiratory symptoms. He has undergone lung cancer screening in the past but has not been called back for a repeat screening.  He continues to take vitamin D  and B12 supplements as prescribed.    Social History   Tobacco Use  Smoking Status Former   Current packs/day: 0.00   Average packs/day: 1.5 packs/day for 24.0 years (36.0 ttl pk-yrs)   Types: Cigarettes   Start date: 02/20/1987   Quit date: 02/20/2011   Years since quitting: 12.6  Smokeless Tobacco Never    Current Outpatient Medications on File Prior to Visit  Medication Sig Dispense Refill   blood glucose meter kit and supplies KIT Dispense based on patient and insurance preference. Use once daily as directed. (FOR ICD-10 E11.9). 1 each 0   Cholecalciferol (VITAMIN D3) 50 MCG (2000 UT) capsule Take 2,000 Units by mouth daily.     cyanocobalamin  (VITAMIN B12)  1000 MCG tablet Take 1,000 mcg by mouth daily.     dapagliflozin  propanediol (FARXIGA ) 10 MG TABS tablet Take 1 tablet (10 mg total) by mouth daily before breakfast. 90 tablet 3   gabapentin  (NEURONTIN ) 300 MG capsule TAKE 1 CAPSULE BY MOUTH EVERYDAY AT BEDTIME 30 capsule 0   glucose blood (TRUE METRIX BLOOD GLUCOSE TEST) test strip TEST ONE TIME DAILY AS DIRECTED 100 strip 3   Lancets (ONETOUCH DELICA PLUS LANCET33G) MISC SMARTSIG:1 Topical Daily     Multiple Vitamin (MULTIVITAMIN WITH MINERALS) TABS tablet Take 1 tablet by mouth daily.     sertraline  (ZOLOFT ) 50 MG tablet Take 1 tablet (50 mg total) by mouth daily. 90 tablet 3   No current facility-administered medications on file prior to visit.     ROS see history of present illness  Objective  Physical Exam Vitals:   09/26/23 1515  BP: 110/70  Pulse: 83  Temp: 98.3 F (36.8 C)  SpO2: 95%    BP Readings from Last 3 Encounters:  09/26/23 110/70  07/09/23 124/74  06/26/23 110/70   Wt Readings from Last 3 Encounters:  09/26/23 203 lb 6.4 oz (92.3 kg)  07/09/23 206 lb 4 oz (93.6 kg)  06/26/23 202 lb 3.2 oz (91.7 kg)    Physical Exam Constitutional:      General: He is not in acute distress.    Appearance: Normal appearance.  HENT:     Head: Normocephalic.  Cardiovascular:     Rate  and Rhythm: Normal rate and regular rhythm.     Heart sounds: Normal heart sounds.  Pulmonary:     Effort: Pulmonary effort is normal.     Breath sounds: Normal breath sounds.  Skin:    General: Skin is warm and dry.  Neurological:     General: No focal deficit present.     Mental Status: He is alert.  Psychiatric:        Mood and Affect: Mood normal.        Behavior: Behavior normal.      Assessment/Plan: Please see individual problem list.  Type 2 diabetes mellitus with hyperglycemia, without long-term current use of insulin (HCC) Assessment & Plan: A1c has increased to 8.5%, indicating poor glycemic control. Current  medications may be less effective. Switch from Ozempic  to Mounjaro , starting at 5 mg weekly for four weeks, then increase to 7.5 mg weekly for two months. Continue Farxiga  10 mg daily. Monitor for side effects such as nausea and severe abdominal pain. Recheck A1c in three months. Encourage dietary modifications to reduce sugar intake.  Orders: -     Microalbumin / creatinine urine ratio -     POCT glycosylated hemoglobin (Hb A1C) -     Tirzepatide ; Inject 5 mg into the skin once a week. X 4 weeks then increase to 7.5  Dispense: 2 mL; Refill: 0 -     Tirzepatide ; Inject 7.5 mg into the skin once a week.  Dispense: 2 mL; Refill: 1  Anxiety and depression Assessment & Plan: Mood is well-managed on Zoloft  with no signs of anxiety or depression. Continue Zoloft  50 mg daily.    Elevated LDL cholesterol level Assessment & Plan: Managed with Crestor  and Zetia . Continue both medications as currently prescribed. Encourage healthy diet and regular exercise.    Former smoker -     Ambulatory Referral for Lung Cancer Scre     Return in about 3 months (around 12/27/2023) for Follow up.   Brad Glance, NP-C [facility]

## 2023-09-27 ENCOUNTER — Other Ambulatory Visit: Payer: Self-pay

## 2023-09-27 ENCOUNTER — Other Ambulatory Visit: Payer: Self-pay | Admitting: Acute Care

## 2023-09-27 DIAGNOSIS — Z87891 Personal history of nicotine dependence: Secondary | ICD-10-CM

## 2023-09-27 DIAGNOSIS — E785 Hyperlipidemia, unspecified: Secondary | ICD-10-CM

## 2023-09-27 DIAGNOSIS — Z122 Encounter for screening for malignant neoplasm of respiratory organs: Secondary | ICD-10-CM

## 2023-09-27 LAB — MICROALBUMIN / CREATININE URINE RATIO
Creatinine,U: 60 mg/dL
Microalb Creat Ratio: 21.9 mg/g (ref 0.0–30.0)
Microalb, Ur: 1.3 mg/dL (ref 0.0–1.9)

## 2023-09-27 MED ORDER — EZETIMIBE 10 MG PO TABS: 10.0000 mg | ORAL_TABLET | Freq: Every day | ORAL | 3 refills | Status: AC

## 2023-09-27 MED ORDER — ROSUVASTATIN CALCIUM 40 MG PO TABS: 40.0000 mg | ORAL_TABLET | Freq: Every day | ORAL | 3 refills | Status: AC

## 2023-10-02 ENCOUNTER — Telehealth: Payer: Self-pay | Admitting: *Deleted

## 2023-10-02 ENCOUNTER — Encounter: Payer: Self-pay | Admitting: *Deleted

## 2023-10-02 NOTE — Telephone Encounter (Signed)
 Patient notified via mychart that Patient Assistance Medication are in the office & are ready for pick up.   Medication: Ozempic  8mg /3ml  Quantity: 4 boxes  Lot# [id number]  Exp: 01/30/2026

## 2023-10-07 ENCOUNTER — Encounter: Payer: Self-pay | Admitting: Nurse Practitioner

## 2023-10-07 NOTE — Assessment & Plan Note (Signed)
 Managed with Crestor  and Zetia . Continue both medications as currently prescribed. Encourage healthy diet and regular exercise.

## 2023-10-07 NOTE — Assessment & Plan Note (Signed)
 A1c has increased to 8.5%, indicating poor glycemic control. Current medications may be less effective. Switch from Ozempic  to Mounjaro , starting at 5 mg weekly for four weeks, then increase to 7.5 mg weekly for two months. Continue Farxiga  10 mg daily. Monitor for side effects such as nausea and severe abdominal pain. Recheck A1c in three months. Encourage dietary modifications to reduce sugar intake.

## 2023-10-07 NOTE — Assessment & Plan Note (Signed)
 Mood is well-managed on Zoloft  with no signs of anxiety or depression. Continue Zoloft  50 mg daily.

## 2023-10-22 ENCOUNTER — Telehealth: Payer: Self-pay | Admitting: Nurse Practitioner

## 2023-10-22 DIAGNOSIS — E1165 Type 2 diabetes mellitus with hyperglycemia: Secondary | ICD-10-CM

## 2023-10-22 MED ORDER — TIRZEPATIDE 7.5 MG/0.5ML ~~LOC~~ SOAJ: 7.5000 mg | SUBCUTANEOUS | 1 refills | Status: DC

## 2023-10-22 NOTE — Telephone Encounter (Signed)
 Copied from CRM [id number]. Topic: Clinical - Medication Refill >> Oct 22, 2023  1:22 PM [place] wrote: Medication: tirzepatide  (MOUNJARO ) 7.5 MG/0.5ML Pen [[id number]]  Has the patient contacted their pharmacy? Yes (Agent: If no, request that the patient contact the pharmacy for the refill. If patient does not wish to contact the pharmacy document the reason why and proceed with request.) (Agent: If yes, when and what did the pharmacy advise?)  This is the patient's preferred pharmacy:    [id number] - [place], [place] - [address] [place] [address] Phone: [phone] Fax: [phone]  Is this the correct pharmacy for this prescription? Yes If no, delete pharmacy and type the correct one.   Has the prescription been filled recently? Yes  Is the patient out of the medication? Yes  Has the patient been seen for an appointment in the last year OR does the patient have an upcoming appointment? Yes  Can we respond through MyChart? Yes  Agent: Please be advised that Rx refills may take up to 3 business days. We ask that you follow-up with your pharmacy.

## 2023-12-17 ENCOUNTER — Other Ambulatory Visit: Payer: Self-pay | Admitting: Nurse Practitioner

## 2023-12-17 DIAGNOSIS — E1165 Type 2 diabetes mellitus with hyperglycemia: Secondary | ICD-10-CM

## 2023-12-27 NOTE — Progress Notes (Signed)
 Brad Jimenez.                                          MRN: [id number]   12/27/2023   The [facility]BCI Quality Team Specialist reviewed this patient medical record for the purposes of chart review for care gap closure. The following were reviewed: abstraction for care gap closure-kidney health evaluation for diabetes:eGFR  and uACR.    VBCI Quality Team

## 2023-12-31 ENCOUNTER — Encounter: Payer: Self-pay | Admitting: Nurse Practitioner

## 2023-12-31 ENCOUNTER — Ambulatory Visit (INDEPENDENT_AMBULATORY_CARE_PROVIDER_SITE_OTHER): Admitting: Nurse Practitioner

## 2023-12-31 VITALS — BP 132/76 | HR 92 | Temp 98.1°F | Ht 67.0 in | Wt 201.2 lb

## 2023-12-31 DIAGNOSIS — F32A Depression, unspecified: Secondary | ICD-10-CM

## 2023-12-31 DIAGNOSIS — Z7985 Long-term (current) use of injectable non-insulin antidiabetic drugs: Secondary | ICD-10-CM

## 2023-12-31 DIAGNOSIS — E119 Type 2 diabetes mellitus without complications: Secondary | ICD-10-CM

## 2023-12-31 DIAGNOSIS — F419 Anxiety disorder, unspecified: Secondary | ICD-10-CM | POA: Diagnosis not present

## 2023-12-31 DIAGNOSIS — Z7984 Long term (current) use of oral hypoglycemic drugs: Secondary | ICD-10-CM

## 2023-12-31 DIAGNOSIS — E7800 Pure hypercholesterolemia, unspecified: Secondary | ICD-10-CM | POA: Diagnosis not present

## 2023-12-31 MED ORDER — TIRZEPATIDE 10 MG/0.5ML ~~LOC~~ SOAJ: 10.0000 mg | SUBCUTANEOUS | 1 refills | Status: DC

## 2023-12-31 NOTE — Assessment & Plan Note (Signed)
 Mood is well-managed on Zoloft  with no signs of anxiety or depression. Continue Zoloft  50 mg daily.

## 2023-12-31 NOTE — Assessment & Plan Note (Signed)
 He is on Mounjaro  7.5 mg weekly and Farxiga  10 mg daily, with insurance covering Mounjaro . Stress and dietary habits may contribute to poor control. Increase Mounjaro  to 10 mg weekly after the current dose. Order hemoglobin A1c to assess glycemic control and BMP. Advise dietary modifications to reduce sugar intake. Schedule a follow-up in six weeks to reassess diabetes management and consider further Mounjaro  titration. Encourage regular exercise.

## 2023-12-31 NOTE — Assessment & Plan Note (Signed)
 Managed with Crestor  and Zetia . Continue both medications as currently prescribed. Encourage healthy diet and regular exercise.

## 2023-12-31 NOTE — Progress Notes (Signed)
 Brad Glance, NP-C Phone: [phone]  Brad Ramthun. is a 53 y.o. male who presents today for diabetes follow up.   Discussed the use of AI scribe software for clinical note transcription with the patient, who gave verbal consent to proceed.  History of Present Illness   Brad Banfill. is a 53 year old male with diabetes who presents for a three-month follow-up after starting Mounjaro .  He started Mounjaro  three months ago after discontinuing Ozempic . Initially, he took 5 mg of Mounjaro  for four weeks and then increased to 7.5 mg weekly. His insurance, [employer], covers the medication.  He does not check his blood sugar levels regularly. No excessive thirst, excessive urination, or hypoglycemic episodes. He has an increased craving for sweets, especially when they are in front of him, and mentions that his wife's recent birthday celebrations have contributed to his consumption of desserts. He observes a slight decrease in appetite the day after taking Mounjaro , but this effect does not last throughout the week. He prefers drinking unsweetened tea over water, although he tries to consume three to four bottles of water daily.  He continues to take Farxiga  10 mg, Crestor , Zetia , and Zoloft .  He mentions stress related to his wife's financial issues, which he feels contributes to fluctuations in his diabetes control.      Social History   Tobacco Use  Smoking Status Former   Current packs/day: 0.00   Average packs/day: 1.5 packs/day for 24.0 years (36.0 ttl pk-yrs)   Types: Cigarettes   Start date: 02/20/1987   Quit date: 02/20/2011   Years since quitting: 12.8  Smokeless Tobacco Never    Current Outpatient Medications on File Prior to Visit  Medication Sig Dispense Refill   blood glucose meter kit and supplies KIT Dispense based on patient and insurance preference. Use once daily as directed. (FOR ICD-10 E11.9). 1 each 0   Cholecalciferol (VITAMIN D3) 50 MCG (2000 UT) capsule Take  2,000 Units by mouth daily.     cyanocobalamin  (VITAMIN B12) 1000 MCG tablet Take 1,000 mcg by mouth daily.     dapagliflozin  propanediol (FARXIGA ) 10 MG TABS tablet Take 1 tablet (10 mg total) by mouth daily before breakfast. 90 tablet 3   ezetimibe  (ZETIA ) 10 MG tablet Take 1 tablet (10 mg total) by mouth daily. 90 tablet 3   glucose blood (TRUE METRIX BLOOD GLUCOSE TEST) test strip TEST ONE TIME DAILY AS DIRECTED 100 strip 3   Lancets (ONETOUCH DELICA PLUS LANCET33G) MISC SMARTSIG:1 Topical Daily     Multiple Vitamin (MULTIVITAMIN WITH MINERALS) TABS tablet Take 1 tablet by mouth daily.     rosuvastatin  (CRESTOR ) 40 MG tablet Take 1 tablet (40 mg total) by mouth daily. 90 tablet 3   sertraline  (ZOLOFT ) 50 MG tablet Take 1 tablet (50 mg total) by mouth daily. 90 tablet 3   No current facility-administered medications on file prior to visit.     ROS see history of present illness  Objective  Physical Exam Vitals:   12/31/23 1512  BP: 132/76  Pulse: 92  Temp: 98.1 F (36.7 C)  SpO2: 97%    BP Readings from Last 3 Encounters:  12/31/23 132/76  09/26/23 110/70  07/09/23 124/74   Wt Readings from Last 3 Encounters:  12/31/23 201 lb 3.2 oz (91.3 kg)  09/26/23 203 lb 6.4 oz (92.3 kg)  07/09/23 206 lb 4 oz (93.6 kg)    Physical Exam Constitutional:      General: He is not  in acute distress.    Appearance: Normal appearance.  HENT:     Head: Normocephalic.  Cardiovascular:     Rate and Rhythm: Normal rate and regular rhythm.     Heart sounds: Normal heart sounds.  Pulmonary:     Effort: Pulmonary effort is normal.     Breath sounds: Normal breath sounds.  Skin:    General: Skin is warm and dry.  Neurological:     General: No focal deficit present.     Mental Status: He is alert.  Psychiatric:        Mood and Affect: Mood normal.        Behavior: Behavior normal.      Assessment/Plan: Please see individual problem list.  Type 2 diabetes mellitus without  complication, without long-term current use of insulin (HCC) Assessment & Plan: He is on Mounjaro  7.5 mg weekly and Farxiga  10 mg daily, with insurance covering Mounjaro . Stress and dietary habits may contribute to poor control. Increase Mounjaro  to 10 mg weekly after the current dose. Order hemoglobin A1c to assess glycemic control and BMP. Advise dietary modifications to reduce sugar intake. Schedule a follow-up in six weeks to reassess diabetes management and consider further Mounjaro  titration. Encourage regular exercise.   Orders: -     Tirzepatide ; Inject 10 mg into the skin once a week.  Dispense: 2 mL; Refill: 1 -     Basic metabolic panel with GFR -     Hemoglobin A1c  Elevated LDL cholesterol level Assessment & Plan: Managed with Crestor  and Zetia . Continue both medications as currently prescribed. Encourage healthy diet and regular exercise.    Anxiety and depression Assessment & Plan: Mood is well-managed on Zoloft  with no signs of anxiety or depression. Continue Zoloft  50 mg daily.       Return in about 6 weeks (around 02/11/2024) for Follow up.   Brad Glance, NP-C [facility] - [facility]

## 2024-01-01 LAB — BASIC METABOLIC PANEL WITH GFR
BUN: 15 mg/dL (ref 6–23)
CO2: 25 meq/L (ref 19–32)
Calcium: 9.5 mg/dL (ref 8.4–10.5)
Chloride: 98 meq/L (ref 96–112)
Creatinine, Ser: 0.79 mg/dL (ref 0.40–1.50)
GFR: 100.85 mL/min (ref >60.00)
Glucose, Bld: 339 mg/dL — ABNORMAL HIGH (ref 70–99)
Potassium: 3.9 meq/L (ref 3.5–5.1)
Sodium: 135 meq/L (ref 135–145)

## 2024-01-01 LAB — HEMOGLOBIN A1C: Hgb A1c MFr Bld: 9.5 % — ABNORMAL HIGH (ref 4.6–6.5)

## 2024-01-12 ENCOUNTER — Other Ambulatory Visit: Payer: Self-pay | Admitting: Nurse Practitioner

## 2024-01-12 DIAGNOSIS — E1165 Type 2 diabetes mellitus with hyperglycemia: Secondary | ICD-10-CM

## 2024-01-14 ENCOUNTER — Telehealth: Payer: Self-pay

## 2024-01-14 ENCOUNTER — Ambulatory Visit: Payer: Self-pay | Admitting: Nurse Practitioner

## 2024-01-14 NOTE — Telephone Encounter (Signed)
 PAP: Patient assistance application for Farxiga through [employer] ([employer]) has been mailed to pt's home address on file. Provider portion of application will be faxed to provider's office.

## 2024-01-30 NOTE — Telephone Encounter (Signed)
 Received provider portion PAP application ([employer]) farxiga 

## 2024-01-31 ENCOUNTER — Other Ambulatory Visit: Payer: Self-pay | Admitting: Nurse Practitioner

## 2024-01-31 DIAGNOSIS — E1165 Type 2 diabetes mellitus with hyperglycemia: Secondary | ICD-10-CM

## 2024-02-04 ENCOUNTER — Ambulatory Visit: Payer: Self-pay

## 2024-02-04 NOTE — Telephone Encounter (Signed)
 FYI Only or Action Required?: Action required by provider: clinical question for provider and advised ED.  Patient was last seen in primary care on 12/31/2023 by Gretel App, NP.  Called Nurse Triage reporting Chest Pain.  Symptoms began several days ago.  Interventions attempted: Nothing.  Symptoms are: unchanged.  Triage Disposition: Go to ED Now (or PCP Triage)  Patient/caregiver understands and will follow disposition?: No, wishes to speak with PCP  Copied from CRM #[id number]. Topic: Clinical - Red Word Triage >> Feb 04, 2024  1:30 PM Thersia C wrote: [facility] that prompted transfer to Nurse Triage: Patient called in about his prescription tirzepatide  (MOUNJARO ) 10 MG/0.5ML Pen stated he up his dosage to 10 and since then  Patient has been having pain in chest and left side of arm Reason for Disposition  [1] Chest pain lasts > 5 minutes AND [2] occurred in past 3 days (72 hours) (Exception: Feels exactly the same as previously diagnosed heartburn and has accompanying sour taste in mouth.)  Answer Assessment - Initial Assessment Questions Advised ED. Patient declines. Patient requesting call back   Advised 911 if symptoms worsen. 1. LOCATION: Where does it hurt?       Left side chest pain and left arm back of arm to wrist and fingers; sore to touch; currently 2. RADIATION: Does the pain go anywhere else? (e.g., into neck, jaw, arms, back)     To arm 3. ONSET: When did the chest pain begin? (Minutes, hours or days)      3 weeks 4. PATTERN: Does the pain come and go, or has it been constant since it started?  Does it get worse with exertion?      Constant; steady ache hurts worse when touching 5. DURATION: How long does it last (e.g., seconds, minutes, hours)     3 weeks 6. SEVERITY: How bad is the pain?  (e.g., Scale 1-10; mild, moderate, or severe)     7/10 7. CARDIAC RISK FACTORS: Do you have any history of heart problems or risk factors for heart disease?  (e.g., angina, prior heart attack; diabetes, high blood pressure, high cholesterol, smoker, or strong family history of heart disease)     no 8. PULMONARY RISK FACTORS: Do you have any history of lung disease?  (e.g., blood clots in lung, asthma, emphysema, birth control pills)     no 9. CAUSE: What do you think is causing the chest pain?     Mounjaro  10. OTHER SYMPTOMS: Do you have any other symptoms? (e.g., dizziness, nausea, vomiting, sweating, fever, difficulty breathing, cough)       Denies diff breathing, dizziness, n/v/ d, sweating, fever, cough  Protocols used: Chest Pain-A-AH

## 2024-02-12 ENCOUNTER — Ambulatory Visit (INDEPENDENT_AMBULATORY_CARE_PROVIDER_SITE_OTHER): Admitting: Nurse Practitioner

## 2024-02-12 ENCOUNTER — Encounter: Payer: Self-pay | Admitting: Nurse Practitioner

## 2024-02-12 VITALS — BP 102/64 | HR 83 | Temp 98.3°F | Ht 67.0 in | Wt 198.0 lb

## 2024-02-12 DIAGNOSIS — E119 Type 2 diabetes mellitus without complications: Secondary | ICD-10-CM

## 2024-02-12 DIAGNOSIS — Z7985 Long-term (current) use of injectable non-insulin antidiabetic drugs: Secondary | ICD-10-CM | POA: Diagnosis not present

## 2024-02-12 MED ORDER — BLOOD GLUCOSE MONITORING SUPPL DEVI: 1.0000 | Freq: Every day | 0 refills | Status: AC

## 2024-02-12 MED ORDER — TIRZEPATIDE 12.5 MG/0.5ML ~~LOC~~ SOAJ: 12.5000 mg | SUBCUTANEOUS | 2 refills | Status: DC

## 2024-02-12 MED ORDER — LANCETS MISC: 1.0000 | Freq: Every day | 3 refills | Status: AC

## 2024-02-12 MED ORDER — LANCET DEVICE MISC: 1.0000 | Freq: Every day | 0 refills | Status: AC

## 2024-02-12 MED ORDER — BLOOD GLUCOSE TEST VI STRP: 1.0000 | ORAL_STRIP | Freq: Every day | 3 refills | Status: AC

## 2024-02-12 NOTE — Progress Notes (Signed)
 Brad Glance, NP-C Phone: [phone]  Brad Jimenez. is a 53 y.o. male who presents today for follow up.   Discussed the use of AI scribe software for clinical note transcription with the patient, who gave verbal consent to proceed.  History of Present Illness   Brad Jimenez. is a 53 year old male with diabetes who presents for follow up on his diabetes.  He experienced a burning sensation on the left side of his chest, radiating down the left arm to the outer two fingers. This episode lasted for approximately three weeks before subsiding. He recalls a similar episode last year, which was attributed to high blood sugar levels. No associated symptoms such as shortness of breath, vomiting, sweating, or chest pressure were noted. He also reports no rash or other skin changes during the episode of chest pain.  He has a history of diabetes with recent blood sugar levels reaching 200 mg/dL in the morning, which have since decreased to around 160 mg/dL after adjusting his medication. He is currently on Mounjaro , with a recent increase in dosage from 7.5 mg to 10 mg weekly. His A1c was 9.5 six weeks ago. He experiences excessive thirst and frequent urination, including nocturia. He is trying to manage his diet by reducing intake of sweets and ice cream. He reports that his clothes are fitting more loosely, suggesting he may have lost some weight, and he is actively monitoring his blood sugar levels, although he needs a new Jimenez due to a dead battery.      Social History   Tobacco Use  Smoking Status Former   Current packs/day: 0.00   Average packs/day: 1.5 packs/day for 24.0 years (36.0 ttl pk-yrs)   Types: Cigarettes   Start date: 02/20/1987   Quit date: 02/20/2011   Years since quitting: 12.9  Smokeless Tobacco Never    Current Outpatient Medications on File Prior to Visit  Medication Sig Dispense Refill   Cholecalciferol (VITAMIN D3) 50 MCG (2000 UT) capsule Take 2,000 Units by  mouth daily.     cyanocobalamin  (VITAMIN B12) 1000 MCG tablet Take 1,000 mcg by mouth daily.     dapagliflozin  propanediol (FARXIGA ) 10 MG TABS tablet Take 1 tablet (10 mg total) by mouth daily before breakfast. 90 tablet 3   ezetimibe  (ZETIA ) 10 MG tablet Take 1 tablet (10 mg total) by mouth daily. 90 tablet 3   Multiple Vitamin (MULTIVITAMIN WITH MINERALS) TABS tablet Take 1 tablet by mouth daily.     rosuvastatin  (CRESTOR ) 40 MG tablet Take 1 tablet (40 mg total) by mouth daily. 90 tablet 3   sertraline  (ZOLOFT ) 50 MG tablet Take 1 tablet (50 mg total) by mouth daily. 90 tablet 3   No current facility-administered medications on file prior to visit.     ROS see history of present illness  Objective  Physical Exam Vitals:   02/12/24 1559  BP: 102/64  Pulse: 83  Temp: 98.3 F (36.8 C)  SpO2: 96%    BP Readings from Last 3 Encounters:  02/12/24 102/64  12/31/23 132/76  09/26/23 110/70   Wt Readings from Last 3 Encounters:  02/12/24 198 lb (89.8 kg)  12/31/23 201 lb 3.2 oz (91.3 kg)  09/26/23 203 lb 6.4 oz (92.3 kg)    Physical Exam Constitutional:      General: He is not in acute distress.    Appearance: Normal appearance.  HENT:     Head: Normocephalic.  Cardiovascular:     Rate and  Rhythm: Normal rate and regular rhythm.     Heart sounds: Normal heart sounds.  Pulmonary:     Effort: Pulmonary effort is normal.     Breath sounds: Normal breath sounds.  Skin:    General: Skin is warm and dry.  Neurological:     General: No focal deficit present.     Mental Status: He is alert.  Psychiatric:        Mood and Affect: Mood normal.        Behavior: Behavior normal.      Assessment/Plan: Please see individual problem list.  Type 2 diabetes mellitus without complication, without long-term current use of insulin (HCC) Assessment & Plan: Recent chest pain is likely nerve related and due to hyperglycemia. Previously evaluated for prior episode. Blood glucose  improved from 200 mg/dL to 839 mg/dL. A1c was 9.5% six weeks ago. He reports excessive thirst and nocturia and prefers not to use a continuous glucose monitor. Continue Mounjaro  10 mg weekly for four more weeks, then increase to 12.5 mg weekly. A new blood glucose Jimenez was provided. An A1c test is scheduled for early January. Dietary modifications to reduce sweets intake were encouraged. He was advised to seek medical attention for chest pain with pressure, radiating to arm or jaw, shortness of breath, or sweating. We will continue to monitor.   Orders: -     Tirzepatide ; Inject 12.5 mg into the skin once a week.  Dispense: 2 mL; Refill: 2 -     Blood Glucose Monitoring Suppl; 1 each by Does not apply route daily. Dispense based on patient and insurance preference. (FOR ICD-10 E10.9, E11.9).  Dispense: 1 each; Refill: 0 -     Blood Glucose Test; 1 each by Does not apply route daily. Dispense based on patient and insurance preference. (FOR ICD-10 E10.9, E11.9).  Dispense: 100 strip; Refill: 3 -     Lancet Device; 1 each by Does not apply route daily. Dispense based on patient and insurance preference. (FOR ICD-10 E10.9, E11.9).  Dispense: 1 each; Refill: 0 -     Lancets; 1 each by Does not apply route daily. Dispense based on patient and insurance preference. (FOR ICD-10 E10.9, E11.9).  Dispense: 100 each; Refill: 3     Return in about 8 weeks (around 04/07/2024) for Follow up.   Brad Glance, NP-C [facility] [place]

## 2024-02-12 NOTE — Assessment & Plan Note (Signed)
 Recent chest pain is likely nerve related and due to hyperglycemia. Previously evaluated for prior episode. Blood glucose improved from 200 mg/dL to 839 mg/dL. A1c was 9.5% six weeks ago. He reports excessive thirst and nocturia and prefers not to use a continuous glucose monitor. Continue Mounjaro  10 mg weekly for four more weeks, then increase to 12.5 mg weekly. A new blood glucose meter was provided. An A1c test is scheduled for early January. Dietary modifications to reduce sweets intake were encouraged. He was advised to seek medical attention for chest pain with pressure, radiating to arm or jaw, shortness of breath, or sweating. We will continue to monitor.

## 2024-02-24 NOTE — Telephone Encounter (Signed)
 Reached out to Patient regarding PAP application for Farxiga , left HIPAA compliant v/ m to return call for assistance or questions.

## 2024-03-02 ENCOUNTER — Ambulatory Visit: Payer: Medicare HMO | Admitting: *Deleted

## 2024-03-02 VITALS — Ht 67.0 in | Wt 192.0 lb

## 2024-03-02 DIAGNOSIS — Z0000 Encounter for general adult medical examination without abnormal findings: Secondary | ICD-10-CM | POA: Diagnosis not present

## 2024-03-02 NOTE — Patient Instructions (Signed)
 [name],  Thank you for taking the time for your Medicare Wellness Visit. I appreciate your continued commitment to your health goals. Please review the care plan we discussed, and feel free to reach out if I can assist you further.  Please note that Annual Wellness Visits do not include a physical exam. Some assessments may be limited, especially if the visit was conducted virtually. If needed, we may recommend an in-person follow-up with your provider.  Ongoing Care Seeing your primary care provider every 3 to 6 months helps us  monitor your health and provide consistent, personalized care.  Consider updating your vaccines.  Remember to call the telephone number provided and schedule your lung cancer screening.   Referrals If a referral was made during today's visit and you haven't received any updates within two weeks, please contact the referred provider directly to check on the status.  Recommended Screenings:  Health Maintenance  Topic Date Due   Hepatitis C Screening  Never done   Hepatitis B Vaccine (2 of 3 - 19+ 3-dose series) 03/12/2017   Zoster (Shingles) Vaccine (1 of 2) Never done   Screening for Lung Cancer  08/01/2023   COVID-19 Vaccine (5 - 2025-26 season) 12/02/2023   Flu Shot  06/30/2024*   Complete foot exam   03/10/2024   Eye exam for diabetics  03/12/2024   Hemoglobin A1C  06/29/2024   Yearly kidney health urinalysis for diabetes  09/25/2024   Yearly kidney function blood test for diabetes  12/30/2024   Medicare Annual Wellness Visit  03/02/2025   DTaP/Tdap/Td vaccine (3 - Td or Tdap) 06/06/2025   Colon Cancer Screening  04/11/2032   Pneumococcal Vaccine for age over 7  Completed   HIV Screening  Completed   HPV Vaccine  Aged Out   Meningitis B Vaccine  Aged Out  *Topic was postponed. The date shown is not the original due date.       03/02/2024    3:03 PM  Advanced Directives  Does Patient Have a Medical Advance Directive? No  Would patient like  information on creating a medical advance directive? No - Patient declined    Vision: Annual vision screenings are recommended for early detection of glaucoma, cataracts, and diabetic retinopathy. These exams can also reveal signs of chronic conditions such as diabetes and high blood pressure.  Dental: Annual dental screenings help detect early signs of oral cancer, gum disease, and other conditions linked to overall health, including heart disease and diabetes.  Please see the attached documents for additional preventive care recommendations.

## 2024-03-02 NOTE — Progress Notes (Signed)
 Chief Complaint  Patient presents with   Medicare Wellness    I connected with  Brad Jimenez. on 03/02/24 by a audio enabled telemedicine application and verified that I am speaking with the correct person using two identifiers.  Patient Location: Home  Provider Location: Home Office  Persons Participating in Visit: Patient.  I discussed the limitations of evaluation and management by telemedicine. The patient expressed understanding and agreed to proceed.   Vital Signs: Because this visit was a virtual/telehealth visit, some criteria may be missing or patient reported. Any vitals not documented were not able to be obtained and vitals that have been documented are patient reported.     Subjective:   Brad Lagrange. is a 53 y.o. male who presents for a Medicare Annual Wellness Visit.  Visit info / Clinical Intake: Medicare Wellness Visit Type:: Subsequent Annual Wellness Visit Persons participating in visit and providing information:: patient Medicare Wellness Visit Mode:: Telephone If telephone:: video declined Since this visit was completed virtually, some vitals may be partially provided or unavailable. Missing vitals are due to the limitations of the virtual format.: Unable to obtain vitals - no equipment If Telephone or Video please confirm:: I connected with patient using audio/video enable telemedicine. I verified patient identity with two identifiers, discussed telehealth limitations, and patient agreed to proceed. Patient Location:: Home Provider Location:: Office/Home Interpreter Needed?: No Pre-visit prep was completed: yes AWV questionnaire completed by patient prior to visit?: no Living arrangements:: lives with spouse/significant other Patient's Overall Health Status Rating: good Typical amount of pain: some Does pain affect daily life?: no Are you currently prescribed opioids?: no  Dietary Habits and Nutritional Risks How many meals a day?: 2 Eats fruit  and vegetables daily?: (!) no Most meals are obtained by: preparing own meals In the last 2 weeks, have you had any of the following?: none Diabetic:: (!) yes Any non-healing wounds?: no How often do you check your BS?: 1 Would you like to be referred to a Nutritionist or for Diabetic Management? : no  Functional Status Activities of Daily Living (to include ambulation/medication): Independent Ambulation: Independent Medication Administration: Independent Home Management (perform basic housework or laundry): Independent Manage your own finances?: yes Primary transportation is: driving Concerns about vision?: no *vision screening is required for WTM* Concerns about hearing?: no  Fall Screening Falls in the past year?: 0 Number of falls in past year: 0 Was there an injury with Fall?: 0 Fall Risk Category Calculator: 0 Patient Fall Risk Level: Low Fall Risk  Fall Risk Patient at Risk for Falls Due to: No Fall Risks Fall risk Follow up: Falls evaluation completed; Falls prevention discussed  Home and Transportation Safety: All rugs have non-skid backing?: N/A, no rugs All stairs or steps have railings?: N/A, no stairs Grab bars in the bathtub or shower?: yes Have non-skid surface in bathtub or shower?: (!) no Good home lighting?: yes Regular seat belt use?: yes Hospital stays in the last year:: no  Cognitive Assessment Difficulty concentrating, remembering, or making decisions? : no Will 6CIT or Mini Cog be Completed: yes What year is it?: 0 points What month is it?: 0 points Give patient an address phrase to remember (5 components): [address] [place] About what time is it?: 0 points Count backwards from 20 to 1: 0 points Say the months of the year in reverse: 0 points Repeat the address phrase from earlier: 0 points 6 CIT Score: 0 points  Advance Directives (  For Healthcare) Does Patient Have a Medical Advance Directive?: No Would patient like information on  creating a medical advance directive?: No - Patient declined  Reviewed/Updated  Reviewed/Updated: Reviewed All (Medical, Surgical, Family, Medications, Allergies, Care Teams, Patient Goals)    Allergies (verified) Lipitor [atorvastatin  calcium ], Fentanyl , and Metformin  and related   Current Medications (verified) Outpatient Encounter Medications as of 03/02/2024  Medication Sig   Blood Glucose Monitoring Suppl DEVI 1 each by Does not apply route daily. Dispense based on patient and insurance preference. (FOR ICD-10 E10.9, E11.9).   Cholecalciferol (VITAMIN D3) 50 MCG (2000 UT) capsule Take 2,000 Units by mouth daily.   cyanocobalamin  (VITAMIN B12) 1000 MCG tablet Take 1,000 mcg by mouth daily.   dapagliflozin  propanediol (FARXIGA ) 10 MG TABS tablet Take 1 tablet (10 mg total) by mouth daily before breakfast.   ezetimibe  (ZETIA ) 10 MG tablet Take 1 tablet (10 mg total) by mouth daily.   Glucose Blood (BLOOD GLUCOSE TEST STRIPS) STRP 1 each by Does not apply route daily. Dispense based on patient and insurance preference. (FOR ICD-10 E10.9, E11.9).   Lancet Device MISC 1 each by Does not apply route daily. Dispense based on patient and insurance preference. (FOR ICD-10 E10.9, E11.9).   Lancets MISC 1 each by Does not apply route daily. Dispense based on patient and insurance preference. (FOR ICD-10 E10.9, E11.9).   Multiple Vitamin (MULTIVITAMIN WITH MINERALS) TABS tablet Take 1 tablet by mouth daily.   naproxen sodium (ALEVE) 220 MG tablet Take 220 mg by mouth daily.   rosuvastatin  (CRESTOR ) 40 MG tablet Take 1 tablet (40 mg total) by mouth daily.   sertraline  (ZOLOFT ) 50 MG tablet Take 1 tablet (50 mg total) by mouth daily.   tirzepatide  (MOUNJARO ) 12.5 MG/0.5ML Pen Inject 12.5 mg into the skin once a week.   No facility-administered encounter medications on file as of 03/02/2024.    History: Past Medical History:  Diagnosis Date   Anxiety    Arthritis    Asthma    Chickenpox     COPD (chronic obstructive pulmonary disease) (HCC)    Depression    Diabetes (HCC)    History of blood transfusion    Low back pain    Past Surgical History:  Procedure Laterality Date   APPENDECTOMY  2002   BACK SURGERY  2001, 2002   CHOLECYSTECTOMY  2012   COLONOSCOPY WITH PROPOFOL  N/A 04/11/2022   Procedure: COLONOSCOPY WITH PROPOFOL ;  Surgeon: Unk Corinn Skiff, MD;  Location: [facility] ENDOSCOPY;  Service: Gastroenterology;  Laterality: N/A;   Family History  Problem Relation Age of Onset   Arthritis Mother    Lung cancer Mother    Heart disease Mother    Hypertension Mother    Diabetes Mother    Cancer Mother        Lung   Diabetes Sister    Tongue cancer Sister    Alcoholism Father    Stroke Father    Social History   Occupational History   Not on file  Tobacco Use   Smoking status: Former    Current packs/day: 0.00    Average packs/day: 1.5 packs/day for 24.0 years (36.0 ttl pk-yrs)    Types: Cigarettes    Start date: 02/20/1987    Quit date: 02/20/2011    Years since quitting: 13.0   Smokeless tobacco: Never  Vaping Use   Vaping status: Never Used  Substance and Sexual Activity   Alcohol use: Yes    Alcohol/week: 2.0 standard drinks  of alcohol    Types: 2 Cans of beer per week    Comment: occasional   Drug use: No   Sexual activity: Yes   Tobacco Counseling Counseling given: Not Answered  SDOH Screenings   Food Insecurity: No Food Insecurity (03/02/2024)  Housing: Low Risk  (03/02/2024)  Transportation Needs: No Transportation Needs (03/02/2024)  Utilities: Not At Risk (03/02/2024)  Alcohol Screen: Low Risk  (03/02/2024)  Depression (PHQ2-9): Low Risk  (03/02/2024)  Financial Resource Strain: Low Risk  (03/02/2024)  Physical Activity: Inactive (03/02/2024)  Social Connections: Moderately Integrated (03/02/2024)  Stress: No Stress Concern Present (03/02/2024)  Tobacco Use: Medium Risk (03/02/2024)  Health Literacy: Adequate Health Literacy (03/02/2024)    See flowsheets for full screening details  Depression Screen PHQ 2 & 9 Depression Scale- Over the past 2 weeks, how often have you been bothered by any of the following problems? Little interest or pleasure in doing things: 0 Feeling down, depressed, or hopeless (PHQ Adolescent also includes...irritable): 0 PHQ-2 Total Score: 0 Trouble falling or staying asleep, or sleeping too much: 0 Feeling tired or having little energy: 0 Poor appetite or overeating (PHQ Adolescent also includes...weight loss): 0 Feeling bad about yourself - or that you are a failure or have let yourself or your family down: 0 Trouble concentrating on things, such as reading the newspaper or watching television (PHQ Adolescent also includes...like school work): 0 Moving or speaking so slowly that other people could have noticed. Or the opposite - being so fidgety or restless that you have been moving around a lot more than usual: 0 Thoughts that you would be better off dead, or of hurting yourself in some way: 0 PHQ-9 Total Score: 0 If you checked off any problems, how difficult have these problems made it for you to do your work, take care of things at home, or get along with other people?: Not difficult at all     Goals Addressed             This Visit's Progress    Patient Stated       Wants to lower his A1C             Objective:    Today's Vitals   03/02/24 1459  Weight: 192 lb (87.1 kg)  Height: 5' 7 (1.702 m)   Body mass index is 30.07 kg/m.  Hearing/Vision screen Hearing Screening - Comments:: No issues Vision Screening - Comments:: Readers, [facility], up to date, has appointment scheduled Immunizations and Health Maintenance Health Maintenance  Topic Date Due   Hepatitis C Screening  Never done   Hepatitis B Vaccines 19-59 Average Risk (2 of 3 - 19+ 3-dose series) 03/12/2017   Zoster Vaccines- Shingrix (1 of 2) Never done   Lung Cancer Screening  08/01/2023   COVID-19 Vaccine  (5 - 2025-26 season) 12/02/2023   Influenza Vaccine  06/30/2024 (Originally 11/01/2023)   FOOT EXAM  03/10/2024   OPHTHALMOLOGY EXAM  03/12/2024   HEMOGLOBIN A1C  06/29/2024   Diabetic kidney evaluation - Urine ACR  09/25/2024   Diabetic kidney evaluation - eGFR measurement  12/30/2024   Medicare Annual Wellness (AWV)  03/02/2025   DTaP/Tdap/Td (3 - Td or Tdap) 06/06/2025   Colonoscopy  04/11/2032   Pneumococcal Vaccine: 50+ Years  Completed   HIV Screening  Completed   HPV VACCINES  Aged Out   Meningococcal B Vaccine  Aged Out        Assessment/Plan:  This is a routine  wellness examination for [facility].  Patient Care Team: Gretel App, NP as PCP - General (Nurse Practitioner) Unk Corinn Skiff, MD as Consulting Physician (Gastroenterology)  I have personally reviewed and noted the following in the patient's chart:   Medical and social history Use of alcohol, tobacco or illicit drugs  Current medications and supplements including opioid prescriptions. Functional ability and status Nutritional status Physical activity Advanced directives List of other physicians Hospitalizations, surgeries, and ER visits in previous 12 months Vitals Screenings to include cognitive, depression, and falls Referrals and appointments  No orders of the defined types were placed in this encounter.  In addition, I have reviewed and discussed with patient certain preventive protocols, quality metrics, and best practice recommendations. A written personalized care plan for preventive services as well as general preventive health recommendations were provided to patient.   Angeline Fredericks, LPN   [id number]   Return in 1 year (on 03/02/2025).  After Visit Summary: (MyChart) Due to this being a telephonic visit, the after visit summary with patients personalized plan was offered to patient via MyChart   Nurse Notes: Patient declines most vaccines but will consider the shingles vaccines. Patient declines  Hepatitis C screening at this time. Patient will call and schedule his lung cancer screening telephone number provided. SABRA

## 2024-03-09 DIAGNOSIS — H52223 Regular astigmatism, bilateral: Secondary | ICD-10-CM | POA: Diagnosis not present

## 2024-03-13 NOTE — Progress Notes (Signed)
 Brad Jimenez.                                          MRN: [id number]   03/13/2024   The [facility] Quality Team Specialist reviewed this patient medical record for the purposes of chart review for care gap closure. The following were reviewed: chart review for care gap closure-glycemic status assessment.    [facility] Quality Team

## 2024-03-23 ENCOUNTER — Other Ambulatory Visit: Payer: Self-pay

## 2024-03-23 DIAGNOSIS — F419 Anxiety disorder, unspecified: Secondary | ICD-10-CM

## 2024-03-23 MED ORDER — SERTRALINE HCL 50 MG PO TABS: 50.0000 mg | ORAL_TABLET | Freq: Every day | ORAL | 3 refills | Status: AC

## 2024-04-08 NOTE — Telephone Encounter (Signed)
 Reached out to Patient to check on Pap application and he says he is covered with [employer] for medication with low co-pay at this time does not need PAP assistance.

## 2024-04-16 ENCOUNTER — Ambulatory Visit: Admitting: Nurse Practitioner

## 2024-04-16 ENCOUNTER — Encounter: Payer: Self-pay | Admitting: Nurse Practitioner

## 2024-04-16 VITALS — BP 122/70 | HR 81 | Temp 97.8°F | Ht 67.0 in | Wt 198.8 lb

## 2024-04-16 DIAGNOSIS — Z7984 Long term (current) use of oral hypoglycemic drugs: Secondary | ICD-10-CM

## 2024-04-16 DIAGNOSIS — E1165 Type 2 diabetes mellitus with hyperglycemia: Secondary | ICD-10-CM | POA: Diagnosis not present

## 2024-04-16 DIAGNOSIS — Z7985 Long-term (current) use of injectable non-insulin antidiabetic drugs: Secondary | ICD-10-CM

## 2024-04-16 LAB — POCT GLYCOSYLATED HEMOGLOBIN (HGB A1C)
HbA1c POC (<> result, manual entry): 8.1 %
HbA1c, POC (controlled diabetic range): 8.1 % — AB (ref 0.0–7.0)
HbA1c, POC (prediabetic range): 8.1 % — AB (ref 5.7–6.4)
Hemoglobin A1C: 8.1 % — AB (ref 4.0–5.6)

## 2024-04-16 MED ORDER — TIRZEPATIDE 15 MG/0.5ML ~~LOC~~ SOAJ: 15.0000 mg | SUBCUTANEOUS | 5 refills | Status: AC

## 2024-04-16 NOTE — Progress Notes (Signed)
 " Brad Glance, Brad Jimenez Phone: [phone]  Brad Jimenez. is a 54 y.o. male who presents today for follow up.   Discussed the use of AI scribe software for clinical note transcription with the patient, who gave verbal consent to proceed.  History of Present Illness   Brad Jimenez. is a 54 year old male with diabetes who presents for a follow-up visit.  His last A1c in September was 9.5, which has improved today to 8.1. He has been on Mounjaro  12.5 mg for about one and a half to two months. Morning blood sugar levels range from 160 to 200 mg/dL. No excessive urination or hypoglycemic episodes. He is also on Farxiga  for diabetes and kidney protection, and Zetia  for cholesterol management. He mentions dietary improvements and is trying to reduce intake of sweets such as ice cream, donuts, and gummy bears.  In December, he had an eye examination where initial concerns about glaucoma in his right eye were ruled out after further testing.  Socially, he plans to sell property in [place]  and potentially travel with his wife. He enjoys outdoor activities such as riding [place]-by-sides on his property and recently spent time at the beach with his wife over [place].      Tobacco Use History[1]  Medications Ordered Prior to Encounter[2]   ROS see history of present illness  Objective  Physical Exam Vitals:   04/16/24 1509  BP: 122/70  Pulse: 81  Temp: 97.8 F (36.6 C)  SpO2: 98%    BP Readings from Last 3 Encounters:  04/16/24 122/70  02/12/24 102/64  12/31/23 132/76   Wt Readings from Last 3 Encounters:  04/16/24 198 lb 12.8 oz (90.2 kg)  03/02/24 192 lb (87.1 kg)  02/12/24 198 lb (89.8 kg)    Physical Exam Constitutional:      General: He is not in acute distress.    Appearance: Normal appearance.  HENT:     Head: Normocephalic.  Cardiovascular:     Rate and Rhythm: Normal rate and regular rhythm.     Heart sounds: Normal heart sounds.  Pulmonary:     Effort:  Pulmonary effort is normal.     Breath sounds: Normal breath sounds.  Skin:    General: Skin is warm and dry.  Neurological:     General: No focal deficit present.     Mental Status: He is alert.  Psychiatric:        Mood and Affect: Mood normal.        Behavior: Behavior normal.      Assessment/Plan: Please see individual problem list.  Type 2 diabetes mellitus with hyperglycemia, without long-term current use of insulin (HCC) Assessment & Plan: A1c improved from 9.5% to 8.1%. Mounjaro  is effective but not at goal, with morning glucose levels between 160-200 mg/dL and no hypoglycemia. He has a dietary preference for sweets. Increase Mounjaro  to 15 mg weekly after the current supply. Continue Farxiga  for renal protection and diabetes management, and Ezetimibe  for cholesterol management. Encourage dietary modifications to reduce sugar intake and regular exercise. Follow-up in 3 months.   Orders: -     POCT glycosylated hemoglobin (Hb A1C) -     Tirzepatide ; Inject 15 mg into the skin once a week.  Dispense: 2 mL; Refill: 5     Return in about 3 months (around 07/15/2024) for Follow up.   Brad Glance, Brad Jimenez [facility] [place]    [1]  Social History Tobacco Use  Smoking Status Former   Current packs/day: 0.00   Average packs/day: 1.5 packs/day for 24.0 years (36.0 ttl pk-yrs)   Types: Cigarettes   Start date: 02/20/1987   Quit date: 02/20/2011   Years since quitting: 13.1  Smokeless Tobacco Never  [2]  Current Outpatient Medications on File Prior to Visit  Medication Sig Dispense Refill   Blood Glucose Monitoring Suppl DEVI 1 each by Does not apply route daily. Dispense based on patient and insurance preference. (FOR ICD-10 E10.9, E11.9). 1 each 0   Cholecalciferol (VITAMIN D3) 50 MCG (2000 UT) capsule Take 2,000 Units by mouth daily.     cyanocobalamin  (VITAMIN B12) 1000 MCG tablet Take 1,000 mcg by mouth daily.     dapagliflozin  propanediol  (FARXIGA ) 10 MG TABS tablet Take 1 tablet (10 mg total) by mouth daily before breakfast. 90 tablet 3   ezetimibe  (ZETIA ) 10 MG tablet Take 1 tablet (10 mg total) by mouth daily. 90 tablet 3   Glucose Blood (BLOOD GLUCOSE TEST STRIPS) STRP 1 each by Does not apply route daily. Dispense based on patient and insurance preference. (FOR ICD-10 E10.9, E11.9). 100 strip 3   Lancet Device MISC 1 each by Does not apply route daily. Dispense based on patient and insurance preference. (FOR ICD-10 E10.9, E11.9). 1 each 0   Lancets MISC 1 each by Does not apply route daily. Dispense based on patient and insurance preference. (FOR ICD-10 E10.9, E11.9). 100 each 3   Multiple Vitamin (MULTIVITAMIN WITH MINERALS) TABS tablet Take 1 tablet by mouth daily.     naproxen sodium (ALEVE) 220 MG tablet Take 220 mg by mouth daily.     rosuvastatin  (CRESTOR ) 40 MG tablet Take 1 tablet (40 mg total) by mouth daily. 90 tablet 3   sertraline  (ZOLOFT ) 50 MG tablet Take 1 tablet (50 mg total) by mouth daily. 90 tablet 3   No current facility-administered medications on file prior to visit.   "

## 2024-04-16 NOTE — Assessment & Plan Note (Signed)
 A1c improved from 9.5% to 8.1%. Mounjaro  is effective but not at goal, with morning glucose levels between 160-200 mg/dL and no hypoglycemia. He has a dietary preference for sweets. Increase Mounjaro  to 15 mg weekly after the current supply. Continue Farxiga  for renal protection and diabetes management, and Ezetimibe  for cholesterol management. Encourage dietary modifications to reduce sugar intake and regular exercise. Follow-up in 3 months.

## 2024-04-28 NOTE — Progress Notes (Addendum)
 Brad Jimenez.                                          MRN: [id number]   04/28/2024   The [facility] Quality Team Specialist reviewed this patient medical record for the purposes of chart review for care gap closure. The following were reviewed: chart review for care gap closure-glycemic status assessment.no more gsd's to abstract for 2025.    [facility] Quality Team

## 2024-05-01 ENCOUNTER — Telehealth: Payer: Self-pay | Admitting: *Deleted

## 2024-05-01 NOTE — Telephone Encounter (Unsigned)
 Copied from CRM [id number]. Topic: Clinical - Medication Question >> Apr 30, 2024  4:51 PM Hadassah PARAS wrote: Reason for CRM: Insurance has changed and medication tirzepatide  (MOUNJARO ) 15 MG/0.5ML Pen is now requiring co-pay. Pt's wife would like to speak with someone who can assist w/o having to pay anything. Would like to speak with pharmacisist that assisted her previosuly. Please advise on #[phone], Rosalynn [phone]

## 2024-05-04 ENCOUNTER — Encounter: Payer: Self-pay | Admitting: Pharmacist

## 2024-05-04 NOTE — Progress Notes (Signed)
 Brief Telephone Documentation Reason for Call: Patient request to speak w pharmacist regarding insurance changes   Summary of Call: Wife reports change in insurance plan for 2026.  Was receiving Ozempic  through PAP though was switched to Mounjaro  which his insurance was covering for $0 at the end of the year. Notes with deductible reset Jan 1st, cost has increased.   Insurance Coverage: YES [employer] MED[employer]ARE Value Plus (HMO) - [id number] Summary of Benefit: [email or web address]   Considerations: Plan benefit did not largely change from 2025. They still had a 25% co-insurance on brand meds with deductible $250. It is possible they met the $2000 OOP max spending which would have resulted in all subsequent copays of $0 through Dec 31.  Unfortunately, there is no assistance for Mounjaro  through PAP.  Also discussed option of switching between Medicare Advantage plans through March 31st. There are [facility] plans that offer $47/month copays on preferred brands.   Patient and wife verbalize understanding of options. No further questions. Advised them to reach out with any further questions/concerns.   Manuelita FABIENE Kobs, PharmD, BCACP, CPP Clinical Pharmacist Practitioner [facility] Ph: [phone]

## 2024-05-05 NOTE — Telephone Encounter (Signed)
 noted

## 2024-07-15 ENCOUNTER — Ambulatory Visit: Admitting: Nurse Practitioner

## 2025-03-08 ENCOUNTER — Ambulatory Visit
